# Patient Record
Sex: Female | Born: 1945
Health system: Southern US, Community
[De-identification: ages and names within clinical notes are randomized; demographics above are authoritative.]

## PROBLEM LIST (undated history)

## (undated) DIAGNOSIS — I10 Essential (primary) hypertension: Secondary | ICD-10-CM

## (undated) DIAGNOSIS — M1990 Unspecified osteoarthritis, unspecified site: Secondary | ICD-10-CM

## (undated) DIAGNOSIS — E119 Type 2 diabetes mellitus without complications: Secondary | ICD-10-CM

## (undated) HISTORY — PX: TONSILLECTOMY: SUR1361

## (undated) HISTORY — PX: GASTRIC BYPASS: SHX52

## (undated) HISTORY — DX: Essential (primary) hypertension: I10

## (undated) HISTORY — DX: Type 2 diabetes mellitus without complications: E11.9

## (undated) HISTORY — PX: COLON RESECTION: SHX5231

## (undated) HISTORY — DX: Unspecified osteoarthritis, unspecified site: M19.90

---

## 2011-10-02 ENCOUNTER — Emergency Department (INDEPENDENT_AMBULATORY_CARE_PROVIDER_SITE_OTHER): Payer: 59

## 2011-10-02 ENCOUNTER — Emergency Department (HOSPITAL_BASED_OUTPATIENT_CLINIC_OR_DEPARTMENT_OTHER)
Admission: EM | Admit: 2011-10-02 | Discharge: 2011-10-03 | Disposition: A | Discharge status: Home / Self Care | Payer: 59 | Attending: Emergency Medicine | Admitting: Emergency Medicine

## 2011-10-02 DIAGNOSIS — Y92009 Unspecified place in unspecified non-institutional (private) residence as the place of occurrence of the external cause: Secondary | ICD-10-CM | POA: Insufficient documentation

## 2011-10-02 DIAGNOSIS — W2203XA Walked into furniture, initial encounter: Secondary | ICD-10-CM | POA: Insufficient documentation

## 2011-10-02 DIAGNOSIS — S81809A Unspecified open wound, unspecified lower leg, initial encounter: Secondary | ICD-10-CM

## 2011-10-02 DIAGNOSIS — S81009A Unspecified open wound, unspecified knee, initial encounter: Secondary | ICD-10-CM | POA: Insufficient documentation

## 2011-10-02 DIAGNOSIS — S81811A Laceration without foreign body, right lower leg, initial encounter: Secondary | ICD-10-CM

## 2011-10-02 DIAGNOSIS — M79609 Pain in unspecified limb: Secondary | ICD-10-CM

## 2011-10-02 IMAGING — CR DG TIBIA/FIBULA 2V*R*
4 series · 4 of 4 positions shown · non-contrast
Comparison: None.

CLINICAL DATA: Ran into the corner of a metal bedframe, with
laceration at the distal aspect of the right lower leg.

RIGHT TIBIA AND FIBULA - 2 VIEW

[t tib/fib ap right (1 of 2)]
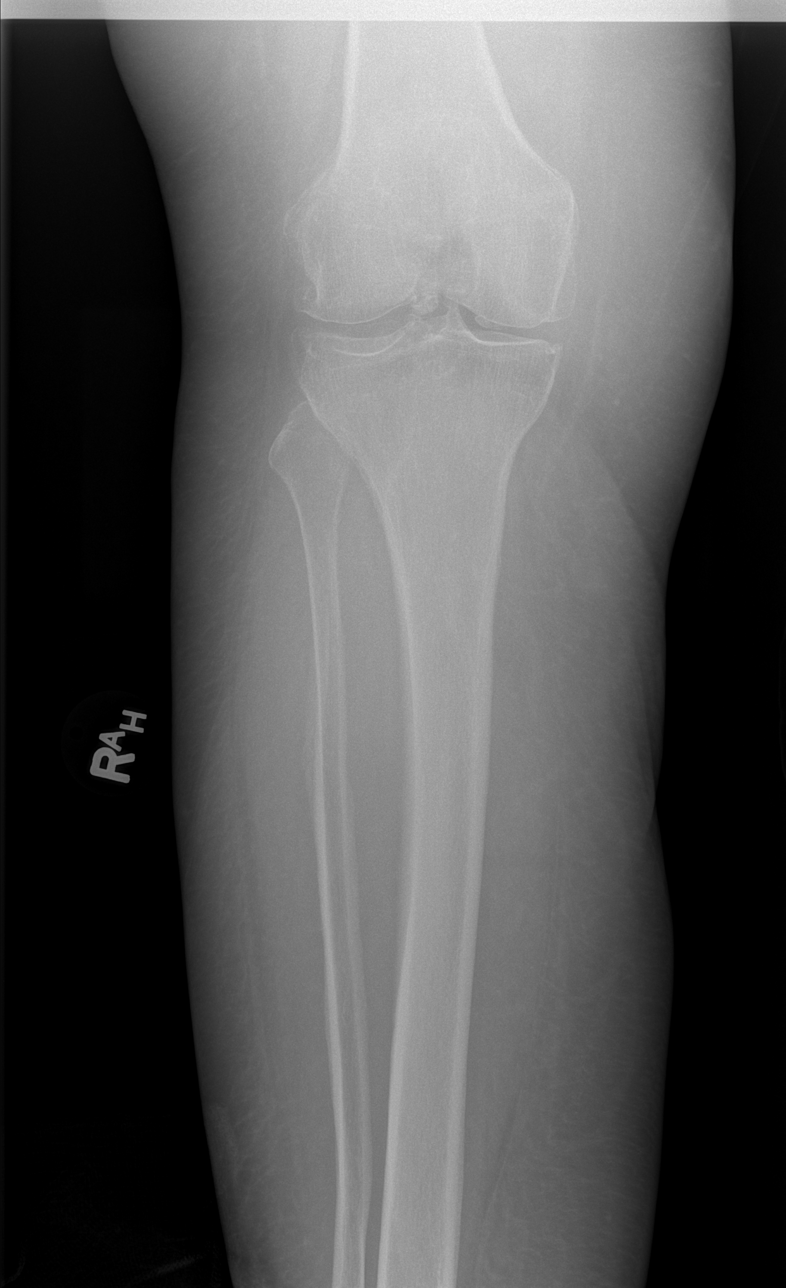

[t tib/fib ap right (2 of 2)]
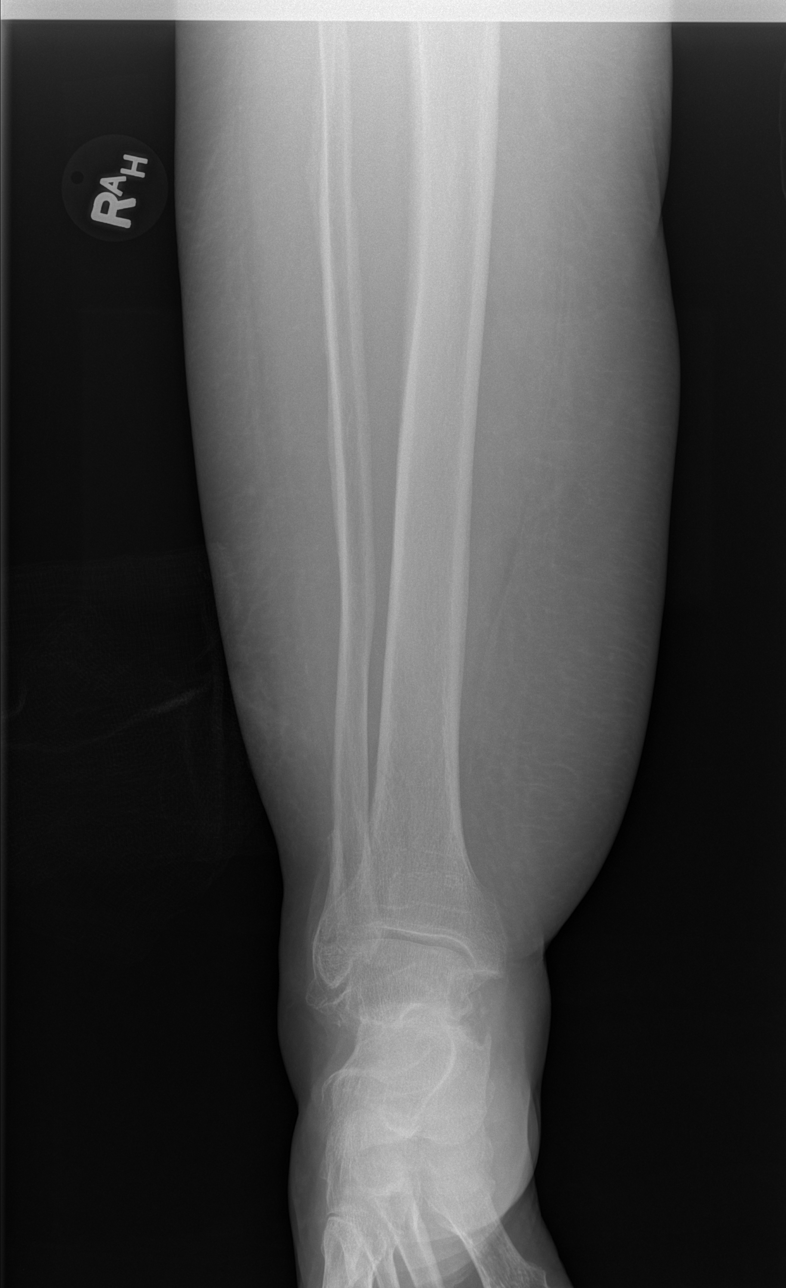

[t tib/fib lat right (1 of 2)]
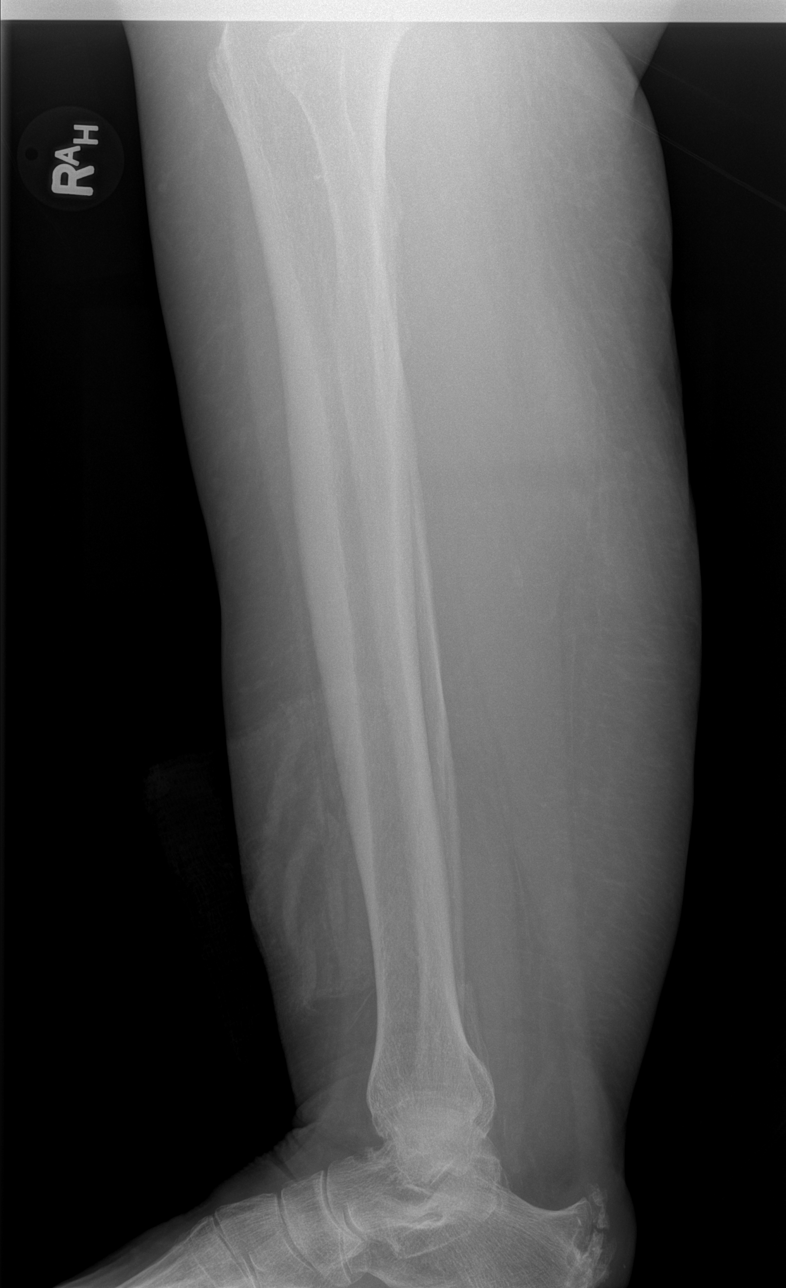

[t tib/fib lat right (2 of 2)]
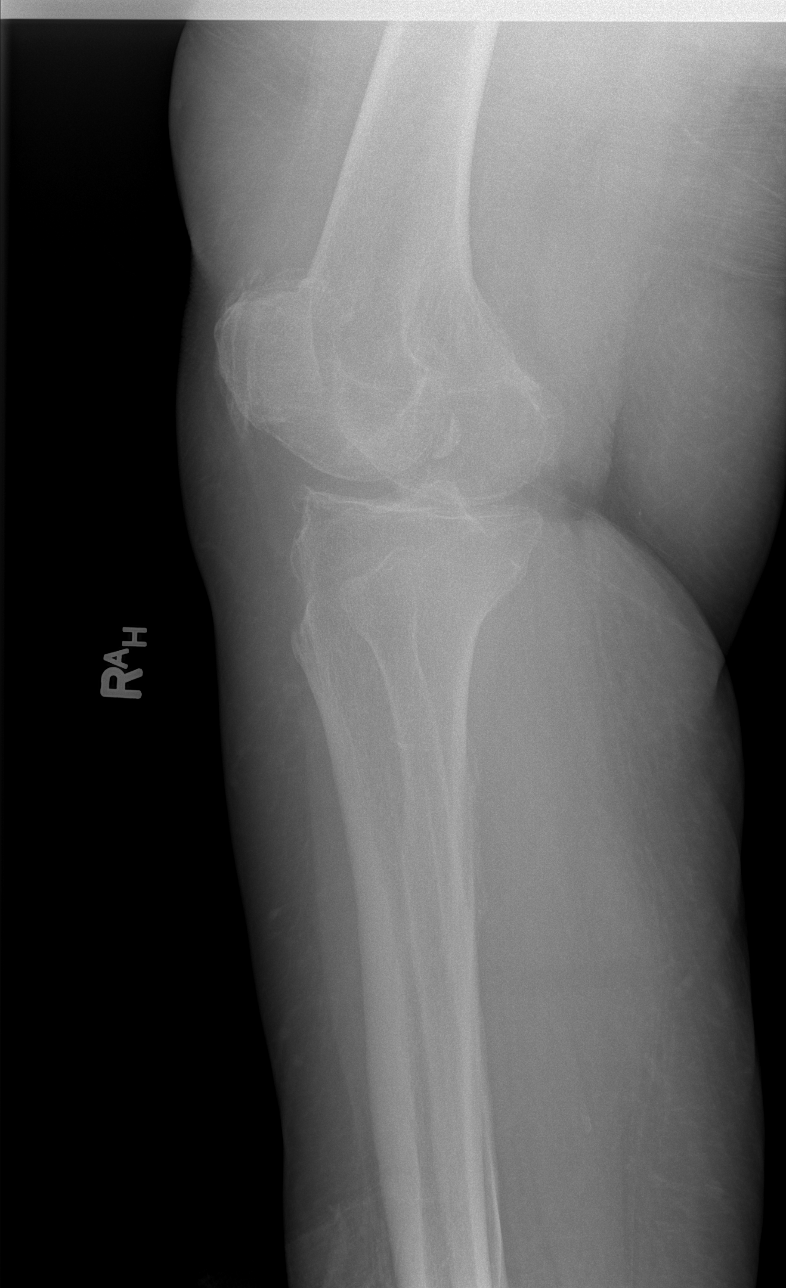

[4 of 4 positions shown; findings below may reference images not displayed]

FINDINGS: There is no evidence of fracture or dislocation.  Known
soft tissue disruption is difficult to fully characterize on
radiograph, though a bandage is noted overlying the lower leg.

The tibia and fibula appear intact.  Mild degenerative change is
noted at the right knee, with apparent small loose bodies and
osteophytes at the tibial spine.  A prominent posterior calcaneal
spur is incidentally noted.

Apparent periosteal reaction along the posterior tibia and fibula
may reflect remote injury.  No definite expansile lesion is seen.
IMPRESSION: 1.  No evidence of fracture or dislocation.
2.  Mild degenerative change at the right knee.

## 2011-10-02 MED ORDER — TETANUS-DIPHTH-ACELL PERTUSSIS 5-2-15.5 LF-MCG/0.5 IM SUSP: 0.5000 mL | Freq: Once | INTRAMUSCULAR | Status: DC

## 2011-10-02 MED ORDER — OXYCODONE-ACETAMINOPHEN 5-325 MG PO TABS
1.0000 | ORAL_TABLET | Freq: Once | ORAL | Status: AC
  Administered 2011-10-02: 1 via ORAL
  Filled 2011-10-02: qty 1

## 2011-10-02 MED ORDER — TETANUS-DIPHTH-ACELL PERTUSSIS 5-2.5-18.5 LF-MCG/0.5 IM SUSP
0.5000 mL | Freq: Once | INTRAMUSCULAR | Status: AC
  Administered 2011-10-02: 0.5 mL via INTRAMUSCULAR
  Filled 2011-10-02: qty 0.5

## 2011-10-02 MED ORDER — LIDOCAINE-EPINEPHRINE 2 %-1:100000 IJ SOLN
30.0000 mL | Freq: Once | INTRAMUSCULAR | Status: AC
  Administered 2011-10-02: 1 mL
  Filled 2011-10-02: qty 1

## 2011-10-02 NOTE — ED Notes (Signed)
Pt states that she lacerated her right lower anterior leg on her grandson's bed frame.  Pt presents with approx 3in laceration to leg, actively bleeding at triage.  Unaware of last tetanus.

## 2011-10-03 NOTE — ED Provider Notes (Signed)
History     CSN: [id number]  Arrival date & time 10/02/11  2134   First MD Initiated Contact with Patient 10/02/11 2201      Chief Complaint  Patient presents with  . Extremity Laceration     The history is provided by the patient.   the patient reports striking her right lower leg on the edge of the bed and just prior to arrival resulting in laceration a small amount of blood loss.  The pain is worsened by movement and palpation.  She denies numbness tingling or weakness of her right lower extremity.  She is unclear when her last tetanus shot was.  Her symptoms are mild.  Nothing worsens her symptoms.  Nothing improves her symptoms.  Her symptoms are constant.  History reviewed. No pertinent past medical history.  Past Surgical History  Procedure Date  . Tonsillectomy   . Gastric bypass   . Colon resection     History reviewed. No pertinent family history.  History  Substance Use Topics  . Smoking status: Never Smoker   . Smokeless tobacco: Never Used  . Alcohol Use: No    OB History    Grav Para Term Preterm Abortions TAB SAB Ect Mult Living                  Review of Systems  All other systems reviewed and are negative.    Allergies  Codeine  Home Medications   Current Outpatient Rx  Name Route Sig Dispense Refill  . VITAMIN D 2000 UNITS PO CAPS Oral Take 4,000 Units by mouth daily.      Marland Kitchen LEVOTHYROXINE SODIUM 88 MCG PO TABS Oral Take 88 mcg by mouth daily.      Marland Kitchen LISINOPRIL 20 MG PO TABS Oral Take 20 mg by mouth daily.      . ADULT MULTIVITAMIN W/MINERALS CH Oral Take 1 tablet by mouth daily.      Marland Kitchen VITAMIN D (ERGOCALCIFEROL) 50000 UNITS PO CAPS Oral Take 50,000 Units by mouth every 7 (seven) days. Take on Tuesday        BP 120/68  Pulse 67  Temp(Src) 98.1 F (36.7 C) (Oral)  Resp 20  Ht 5\' 3"  (1.6 m)  Wt 184 lb (83.462 kg)  BMI 32.59 kg/m2  SpO2 100%  Physical Exam  Constitutional: She is oriented to person, place, and time. She appears  well-developed and well-nourished.  HENT:  Head: Normocephalic.  Eyes: EOM are normal.  Neck: Normal range of motion.  Pulmonary/Chest: Effort normal.  Musculoskeletal: Normal range of motion.       L-shaped laceration to her distal right lower leg on the lateral aspect.  Injuries down to the subcutaneous tissue.  There are no foreign bodies noted.  The bottom of the wound was found.  There is no active bleeding at this time.  She has normal right DP and PT pulse.  She has normal motor function in her right foot.  There are no signs of contamination or infection at this time  Neurological: She is alert and oriented to person, place, and time.  Psychiatric: She has a normal mood and affect.    ED Course  Procedures (including critical care time)  LACERATION REPAIR Performed by: Lyanne Co Consent: Verbal consent obtained. Risks and benefits: risks, benefits and alternatives were discussed Patient identity confirmed: provided demographic data Time out performed prior to procedure Prepped and Draped in normal sterile fashion Wound explored and a who Laceration Location: right  distal lower leg on lateral aspect Laceration Length: 6cm No Foreign Bodies seen or palpated Anesthesia: local infiltration Local anesthetic: lidocaine 2% with epinephrine Anesthetic total: 10 ml Irrigation method: syringe Amount of cleaning: standard LAYERED CLOSURE Skin closure: 4-0 is a Vicryl(deep), 3-0 Prolene (skin) Number of sutures or staples: 3 deep, 8 superficial Technique: simple interrupted Patient tolerance: Patient tolerated the procedure well with no immediate complications.   Labs Reviewed - No data to display Dg Tibia/fibula Right  10/02/2011  *RADIOLOGY REPORT*  Clinical Data: Ran into the corner of a metal bedframe, with laceration at the distal aspect of the right lower leg.  RIGHT TIBIA AND FIBULA - 2 VIEW  Comparison: None.  Findings: There is no evidence of fracture or  dislocation.  Known soft tissue disruption is difficult to fully characterize on radiograph, though a bandage is noted overlying the lower leg.  The tibia and fibula appear intact.  Mild degenerative change is noted at the right knee, with apparent small loose bodies and osteophytes at the tibial spine.  A prominent posterior calcaneal spur is incidentally noted.  Apparent periosteal reaction along the posterior tibia and fibula may reflect remote injury.  No definite expansile lesion is seen.  IMPRESSION:  1.  No evidence of fracture or dislocation. 2.  Mild degenerative change at the right knee.  Original Report Authenticated By: Tonia Ghent, M.D.   I personally reviewed the x-ray  1. Laceration of right lower extremity       MDM  Laceration repaired.  Images negative.  Infection warnings given.  No other injury        Lyanne Co, MD 10/03/11 [phone]

## 2012-12-03 ENCOUNTER — Emergency Department (HOSPITAL_BASED_OUTPATIENT_CLINIC_OR_DEPARTMENT_OTHER)
Admission: EM | Admit: 2012-12-03 | Discharge: 2012-12-03 | Disposition: A | Discharge status: Home / Self Care | Payer: 59 | Attending: Emergency Medicine | Admitting: Emergency Medicine

## 2012-12-03 ENCOUNTER — Emergency Department (HOSPITAL_BASED_OUTPATIENT_CLINIC_OR_DEPARTMENT_OTHER): Payer: 59

## 2012-12-03 ENCOUNTER — Encounter (HOSPITAL_BASED_OUTPATIENT_CLINIC_OR_DEPARTMENT_OTHER): Payer: Self-pay | Admitting: *Deleted

## 2012-12-03 DIAGNOSIS — R110 Nausea: Secondary | ICD-10-CM | POA: Insufficient documentation

## 2012-12-03 DIAGNOSIS — Z9884 Bariatric surgery status: Secondary | ICD-10-CM | POA: Insufficient documentation

## 2012-12-03 DIAGNOSIS — W1809XA Striking against other object with subsequent fall, initial encounter: Secondary | ICD-10-CM | POA: Insufficient documentation

## 2012-12-03 DIAGNOSIS — Y929 Unspecified place or not applicable: Secondary | ICD-10-CM | POA: Insufficient documentation

## 2012-12-03 DIAGNOSIS — Z79899 Other long term (current) drug therapy: Secondary | ICD-10-CM | POA: Insufficient documentation

## 2012-12-03 DIAGNOSIS — T148XXA Other injury of unspecified body region, initial encounter: Secondary | ICD-10-CM

## 2012-12-03 DIAGNOSIS — Y939 Activity, unspecified: Secondary | ICD-10-CM | POA: Insufficient documentation

## 2012-12-03 DIAGNOSIS — S8000XA Contusion of unspecified knee, initial encounter: Secondary | ICD-10-CM | POA: Insufficient documentation

## 2012-12-03 MED ORDER — IBUPROFEN 200 MG PO TABS
600.0000 mg | ORAL_TABLET | Freq: Once | ORAL | Status: AC
  Administered 2012-12-03: 600 mg via ORAL
  Filled 2012-12-03: qty 1

## 2012-12-03 MED ORDER — HYDROCODONE-ACETAMINOPHEN 5-325 MG PO TABS
2.0000 | ORAL_TABLET | Freq: Once | ORAL | Status: AC
  Administered 2012-12-03: 2 via ORAL
  Filled 2012-12-03: qty 2

## 2012-12-03 MED ORDER — IBUPROFEN 600 MG PO TABS: 600.0000 mg | ORAL_TABLET | Freq: Four times a day (QID) | ORAL | Status: DC | PRN

## 2012-12-03 MED ORDER — HYDROCODONE-ACETAMINOPHEN 5-325 MG PO TABS: 1.0000 | ORAL_TABLET | Freq: Four times a day (QID) | ORAL | Status: DC | PRN

## 2012-12-03 NOTE — ED Notes (Signed)
Pt states she fell in the bathtub about 1-1/2 weeks ago. Has been using rest/ice/elevation without relief.

## 2012-12-03 NOTE — Discharge Instructions (Signed)
Contusion A contusion is a deep bruise. Contusions are the result of an injury that caused bleeding under the skin. The contusion may turn blue, purple, or yellow. Minor injuries will give you a painless contusion, but more severe contusions may stay painful and swollen for a few weeks.  CAUSES  A contusion is usually caused by a blow, trauma, or direct force to an area of the body. SYMPTOMS   Swelling and redness of the injured area.  Bruising of the injured area.  Tenderness and soreness of the injured area.  Pain. DIAGNOSIS  The diagnosis can be made by taking a history and physical exam. An X-ray, CT scan, or MRI may be needed to determine if there were any associated injuries, such as fractures. TREATMENT  Specific treatment will depend on what area of the body was injured. In general, the best treatment for a contusion is resting, icing, elevating, and applying cold compresses to the injured area. Over-the-counter medicines may also be recommended for pain control. Ask your caregiver what the best treatment is for your contusion. HOME CARE INSTRUCTIONS   Put ice on the injured area.  Put ice in a plastic bag.  Place a towel between your skin and the bag.  Leave the ice on for 15 to 20 minutes, 3 to 4 times a day.  Only take over-the-counter or prescription medicines for pain, discomfort, or fever as directed by your caregiver. Your caregiver may recommend avoiding anti-inflammatory medicines (aspirin, ibuprofen, and naproxen) for 48 hours because these medicines may increase bruising.  Rest the injured area.  If possible, elevate the injured area to reduce swelling. SEEK IMMEDIATE MEDICAL CARE IF:   You have increased bruising or swelling.  You have pain that is getting worse.  Your swelling or pain is not relieved with medicines. MAKE SURE YOU:   Understand these instructions.  Will watch your condition.  Will get help right away if you are not doing well or get  worse. Document Released: 07/08/2005 Document Revised: 12/21/2011 Document Reviewed: 08/03/2011 [facility] Patient Information 2013 [place], [place].  RICE: Routine Care for Injuries The routine care of many injuries includes Rest, Ice, Compression, and Elevation (RICE). HOME CARE INSTRUCTIONS  Rest is needed to allow your body to heal. Routine activities can usually be resumed when comfortable. Injured tendons and bones can take up to 6 weeks to heal. Tendons are the cord-like structures that attach muscle to bone.  Ice following an injury helps keep the swelling down and reduces pain.  Put ice in a plastic bag.  Place a towel between your skin and the bag.  Leave the ice on for 15 to 20 minutes, 3 to 4 times a day. Do this while awake, for the first 24 to 48 hours. After that, continue as directed by your caregiver.  Compression helps keep swelling down. It also gives support and helps with discomfort. If an elastic bandage has been applied, it should be removed and reapplied every 3 to 4 hours. It should not be applied tightly, but firmly enough to keep swelling down. Watch fingers or toes for swelling, bluish discoloration, coldness, numbness, or excessive pain. If any of these problems occur, remove the bandage and reapply loosely. Contact your caregiver if these problems continue.  Elevation helps reduce swelling and decreases pain. With extremities, such as the arms, hands, legs, and feet, the injured area should be placed near or above the level of the heart, if possible. SEEK IMMEDIATE MEDICAL CARE IF:  You  have persistent pain and swelling.  You develop redness, numbness, or unexpected weakness.  Your symptoms are getting worse rather than improving after several days. These symptoms may indicate that further evaluation or further X-rays are needed. Sometimes, X-rays may not show a small broken bone (fracture) until 1 week or 10 days later. Make a follow-up appointment with your  caregiver. Ask when your X-ray results will be ready. Make sure you get your X-ray results. Document Released: 01/10/2001 Document Revised: 12/21/2011 Document Reviewed: 02/27/2011 [facility] Patient Information 2013 [place], [place].

## 2012-12-03 NOTE — ED Provider Notes (Signed)
History    This chart was scribed for Derwood Kaplan, MD by Leone Payor, ED Scribe. This patient was seen in room MH02/MH02 and the patient's care was started 3:33 PM.   CSN: [id number]  Arrival date & time 12/03/12  1410   First MD Initiated Contact with Patient 12/03/12 1522      Chief Complaint  Patient presents with  . Knee Injury     The history is provided by the patient. No language interpreter was used.    Nancy Hinton is a 67 y.o. female who presents to the Emergency Department complaining of ongoing, constant, unchanged right knee pain after falling in the bathtub about 1.5 weeks ago. She states she fell onto her right knee and her calf was folded underneath her thigh. States she feels nauseated from the pain when she walks on it. States the affected area goes through intermittent periods of swelling. She is taking aleve with mild periods of relief. Pt takes BP medication and levothyroxine daily. She denies numbness or tingling.   Pt has h/o gastric bypass.  Pt denies smoking and alcohol use.  History reviewed. No pertinent past medical history.  Past Surgical History  Procedure Laterality Date  . Tonsillectomy    . Gastric bypass    . Colon resection      History reviewed. No pertinent family history.  History  Substance Use Topics  . Smoking status: Never Smoker   . Smokeless tobacco: Never Used  . Alcohol Use: No    No OB history provided.   Review of Systems  Constitutional: Negative.   HENT: Negative.   Eyes: Negative.   Respiratory: Negative.   Cardiovascular: Negative.   Gastrointestinal: Negative.   Musculoskeletal: Positive for joint swelling and arthralgias.  Neurological: Negative.  Negative for numbness.  Psychiatric/Behavioral: Negative.   All other systems reviewed and are negative.    Allergies  Codeine  Home Medications   Current Outpatient Rx  Name  Route  Sig  Dispense  Refill  . Cholecalciferol (VITAMIN D) 2000 UNITS CAPS   Oral   Take 4,000 Units by mouth daily.           Marland Kitchen levothyroxine (SYNTHROID, LEVOTHROID) 88 MCG tablet   Oral   Take 88 mcg by mouth daily.           Marland Kitchen lisinopril (PRINIVIL,ZESTRIL) 20 MG tablet   Oral   Take 20 mg by mouth daily.           . Multiple Vitamin (MULITIVITAMIN WITH MINERALS) TABS   Oral   Take 1 tablet by mouth daily.           . Vitamin D, Ergocalciferol, (DRISDOL) 50000 UNITS CAPS   Oral   Take 50,000 Units by mouth every 7 (seven) days. Take on Tuesday             BP 109/51  Pulse 66  Temp(Src) 98.1 F (36.7 C) (Oral)  Resp 16  Ht 5\' 4"  (1.626 m)  Wt 185 lb (83.915 kg)  BMI 31.74 kg/m2  SpO2 100%  Physical Exam  Nursing note and vitals reviewed. Constitutional: She is oriented to person, place, and time. She appears well-developed and well-nourished. No distress.  HENT:  Head: Normocephalic and atraumatic.  Eyes: EOM are normal.  Neck: Neck supple. No tracheal deviation present.  Cardiovascular: Normal rate, regular rhythm and normal heart sounds.  Exam reveals no gallop and no friction rub.   No murmur heard. Pulmonary/Chest: Effort normal and  breath sounds normal. No respiratory distress. She has no wheezes. She has no rales. She exhibits no tenderness.  Abdominal: Soft.  Musculoskeletal: Normal range of motion.  Right leg is non tender except knee area. Tender on post part of knee. Diffuse tenderness all around knee. Worse on the medial and lateral aspect. Able to flex knee however ROM is compromised. Significant edema and effusion with ecchymosis of the knee and proximal tibia. On proximal tibia and mid level of tibia there is tenderness to palpation. 1+ DP bilaterally. Sensation is normal.   Neurological: She is alert and oriented to person, place, and time.  Skin: Skin is warm and dry.  Psychiatric: She has a normal mood and affect. Her behavior is normal.    ED Course  Procedures (including critical care time)  DIAGNOSTIC  STUDIES: Oxygen Saturation is 100% on room air, normal by my interpretation.    COORDINATION OF CARE: 3:32 PM Discussed treatment plan which includes pain medication, knee immobilizer and follow up with orthopedist with pt at bedside and pt agreed to plan.    Labs Reviewed - No data to display Dg Knee Complete 4 Views Right  12/03/2012  *RADIOLOGY REPORT*  Clinical Data: Trauma 1 week ago.  Worsening pain.  RIGHT KNEE - COMPLETE 4+ VIEW  Comparison: 10/02/2011 tibia / fibula films.  Findings:  Mild medial and moderate lateral compartment osteoarthritis.  Mild osteopenia.  Severe patellofemoral osteoarthritis.  Limited evaluation for joint effusion, secondary to patient body habitus.  Apparent prepatellar soft tissue swelling may be due to patient body habitus.  Enthesopathic change at quadriceps insertion and patellar tendon origin.  No definite acute fracture identified.  IMPRESSION: Advanced three compartment osteoarthritis.  No definite acute osseous abnormality or joint effusion.  Degraded evaluation, secondary patient body habitus and underlying osteopenia.   Original Report Authenticated By: Jeronimo Greaves, M.D.      No diagnosis found.    MDM  I personally performed the services described in this documentation, which was scribed in my presence. The recorded information has been reviewed and is accurate.  Pt comes in with knee pain. Fall more than a week ago, ambulating with pain. Knee effusion noted, with diffuse ecchymoses. No clinical concerns for comparment syndrome and no laxity appreciated on anterior and posterior drawers.  Knee immobilizer, RICE, follow up.   Derwood Kaplan, MD 12/03/12 [phone]

## 2013-03-16 ENCOUNTER — Ambulatory Visit (INDEPENDENT_AMBULATORY_CARE_PROVIDER_SITE_OTHER): Payer: 59 | Admitting: Family Medicine

## 2013-03-16 VITALS — BP 132/70 | HR 65 | Temp 98.1°F | Resp 16 | Ht 62.5 in | Wt 198.6 lb

## 2013-03-16 DIAGNOSIS — K1370 Unspecified lesions of oral mucosa: Secondary | ICD-10-CM

## 2013-03-16 DIAGNOSIS — K130 Diseases of lips: Secondary | ICD-10-CM

## 2013-03-16 DIAGNOSIS — J029 Acute pharyngitis, unspecified: Secondary | ICD-10-CM

## 2013-03-16 DIAGNOSIS — I10 Essential (primary) hypertension: Secondary | ICD-10-CM

## 2013-03-16 DIAGNOSIS — R220 Localized swelling, mass and lump, head: Secondary | ICD-10-CM

## 2013-03-16 LAB — POCT RAPID STREP A (OFFICE): Rapid Strep A Screen: NEGATIVE

## 2013-03-16 MED ORDER — MAGIC MOUTHWASH W/LIDOCAINE: 5.0000 mL | Freq: Four times a day (QID) | ORAL | Status: DC | PRN

## 2013-03-16 MED ORDER — AMLODIPINE BESYLATE 5 MG PO TABS: 5.0000 mg | ORAL_TABLET | Freq: Every day | ORAL | Status: DC

## 2013-03-16 NOTE — Progress Notes (Signed)
[facility]:  Office Visit  Chief Complaint:  Chief Complaint  Patient presents with  . Sore Throat  . Mouth Lesions  . lips swollen    HPI: Nancy Hinton is a 67 y.o. female who complains of  sore throat and swelling of lips and tightness in throat x 3 days, this started suddenly. No new foods, no new meds. She has itching around her mouth. Also mouth lesions and painful to drink/eat. On Lisinopril for HTN no prior probelms. No h/o cold sore or canker sores or herpes. She does not have CP/SOB. She states that all her lbawork and vitamins except D are normal, she sees her surgeon for gastric bypass regular.   Past Medical History  Diagnosis Date  . Arthritis   . Hypertension    Past Surgical History  Procedure Laterality Date  . Tonsillectomy    . Gastric bypass    . Colon resection     History   Social History  . Marital Status: Divorced    Spouse Name: N/A    Number of Children: N/A  . Years of Education: N/A   Social History Main Topics  . Smoking status: Never Smoker   . Smokeless tobacco: Never Used  . Alcohol Use: No  . Drug Use: No  . Sexually Active: No   Other Topics Concern  . None   Social History Narrative  . None   Family History  Problem Relation Age of Onset  . Alzheimer's disease Mother   . Heart disease Father   . Heart attack Maternal Grandmother    Allergies  Allergen Reactions  . Codeine Nausea Only   Prior to Admission medications   Medication Sig Start Date End Date Taking? Authorizing Provider  levothyroxine (SYNTHROID, LEVOTHROID) 88 MCG tablet Take 88 mcg by mouth daily.     Yes Historical Provider, MD  lisinopril (PRINIVIL,ZESTRIL) 20 MG tablet Take 20 mg by mouth daily.     Yes Historical Provider, MD  Vitamin D, Ergocalciferol, (DRISDOL) 50000 UNITS CAPS Take 50,000 Units by mouth every 7 (seven) days. Take on Tuesday     Yes Historical Provider, MD  Cholecalciferol (VITAMIN D) 2000 UNITS CAPS Take 4,000  Units by mouth daily.      Historical Provider, MD  HYDROcodone-acetaminophen (NORCO/VICODIN) 5-325 MG per tablet Take 1 tablet by mouth every 6 (six) hours as needed for pain. 12/03/12   Derwood Kaplan, MD  ibuprofen (ADVIL,MOTRIN) 600 MG tablet Take 1 tablet (600 mg total) by mouth every 6 (six) hours as needed for pain. 12/03/12   Derwood Kaplan, MD  Multiple Vitamin (MULITIVITAMIN WITH MINERALS) TABS Take 1 tablet by mouth daily.      Historical Provider, MD     ROS: The patient denies fevers, chills, night sweats, unintentional weight loss, chest pain, palpitations, wheezing, dyspnea on exertion, nausea, vomiting, abdominal pain, dysuria, hematuria, melena, numbness, weakness, or tingling.   All other systems have been reviewed and were otherwise negative with the exception of those mentioned in the HPI and as above.    PHYSICAL EXAM: Filed Vitals:   03/16/13 2011  BP: 132/70  Pulse: 65  Temp: 98.1 F (36.7 C)  Resp: 16   Filed Vitals:   03/16/13 2011  Height: 5' 2.5" (1.588 m)  Weight: 198 lb 9.6 oz (90.084 kg)   Body mass index is 35.72 kg/(m^2).  General: Alert, no acute distress HEENT:  Normocephalic, atraumatic, oropharynx patent. No exudates, no tonsils, + erythema along mucosa  but does not quite appear to be herpetic ulcers Cardiovascular:  Regular rate and rhythm, no rubs murmurs or gallops.  No Carotid bruits, radial pulse intact. No pedal edema.  Respiratory: Clear to auscultation bilaterally.  No wheezes, rales, or rhonchi.  No cyanosis, no use of accessory musculature GI: No organomegaly, abdomen is soft and non-tender, positive bowel sounds.  No masses. Skin: Facial eythema around mouth but no ulcers Neurologic: Facial musculature symmetric. Psychiatric: Patient is appropriate throughout our interaction. Lymphatic: No cervical lymphadenopathy Musculoskeletal: Gait intact.   LABS: Results for orders placed in visit on 03/16/13  POCT RAPID STREP A (OFFICE)       Result Value Range   Rapid Strep A Screen Negative  Negative     EKG/XRAY:   Primary read interpreted by Dr. Conley Rolls at [facility].   ASSESSMENT/PLAN: Encounter Diagnoses  Name Primary?  . Sore throat Yes  . Lip swelling   . Mouth lesion   . HTN (hypertension)    Suspect this may be a reaction to lisinopril She is currently not having SOB/CP/voice changes, pulse ox 99% Throat on exam was patent Will dc lisinopril Place patient on Norvasc 5 mg daily, advise to monitor BP Gave her Magic wouthwash with viscous lidocaine F/u by phone with her in AM.    Nancy Loeffler PHUONG, DO 03/16/2013 8:53 PM    Called patient on 03/17/2013 at 9:55-not improved. So wil rx Vatrex for possible HSV lesions Gross sideeffects, risk and benefits, and alternatives of medications d/w patient. Patient is aware that all medications have potential sideeffects and we are unable to predict every sideeffect or drug-drug interaction that may occur.

## 2013-03-17 MED ORDER — VALACYCLOVIR HCL 1 G PO TABS: ORAL_TABLET | ORAL | Status: DC

## 2013-03-19 LAB — CULTURE, GROUP A STREP: Organism ID, Bacteria: NORMAL

## 2013-03-21 ENCOUNTER — Encounter: Payer: Self-pay | Admitting: *Deleted

## 2013-03-21 ENCOUNTER — Telehealth: Payer: Self-pay | Admitting: Radiology

## 2013-03-21 DIAGNOSIS — K1370 Unspecified lesions of oral mucosa: Secondary | ICD-10-CM

## 2013-03-21 DIAGNOSIS — R220 Localized swelling, mass and lump, head: Secondary | ICD-10-CM

## 2013-03-21 DIAGNOSIS — J029 Acute pharyngitis, unspecified: Secondary | ICD-10-CM

## 2013-03-21 MED ORDER — MAGIC MOUTHWASH W/LIDOCAINE: 5.0000 mL | Freq: Four times a day (QID) | ORAL | Status: DC | PRN

## 2013-03-21 NOTE — Telephone Encounter (Signed)
I spoke to patient she is slightly better, not getting worse. Renewal on her mouthwash was sent she is in [place]. Amy

## 2013-04-07 ENCOUNTER — Ambulatory Visit (INDEPENDENT_AMBULATORY_CARE_PROVIDER_SITE_OTHER): Payer: 59 | Admitting: Family Medicine

## 2013-04-07 VITALS — BP 158/88 | HR 79 | Temp 98.1°F | Resp 16 | Ht 62.0 in | Wt 198.0 lb

## 2013-04-07 DIAGNOSIS — K1370 Unspecified lesions of oral mucosa: Secondary | ICD-10-CM

## 2013-04-07 DIAGNOSIS — R5383 Other fatigue: Secondary | ICD-10-CM

## 2013-04-07 DIAGNOSIS — K121 Other forms of stomatitis: Secondary | ICD-10-CM

## 2013-04-07 DIAGNOSIS — E559 Vitamin D deficiency, unspecified: Secondary | ICD-10-CM

## 2013-04-07 DIAGNOSIS — R5381 Other malaise: Secondary | ICD-10-CM

## 2013-04-07 DIAGNOSIS — E079 Disorder of thyroid, unspecified: Secondary | ICD-10-CM

## 2013-04-07 DIAGNOSIS — F32A Depression, unspecified: Secondary | ICD-10-CM

## 2013-04-07 DIAGNOSIS — F329 Major depressive disorder, single episode, unspecified: Secondary | ICD-10-CM

## 2013-04-07 DIAGNOSIS — F3289 Other specified depressive episodes: Secondary | ICD-10-CM

## 2013-04-07 LAB — POCT CBC
Granulocyte percent: 61.9 %G (ref 37–80)
HCT, POC: 43.7 % (ref 37.7–47.9)
Hemoglobin: 13.6 g/dL (ref 12.2–16.2)
Lymph, poc: 2.2 (ref 0.6–3.4)
MCH, POC: 28.9 pg (ref 27–31.2)
MCHC: 31.1 g/dL — AB (ref 31.8–35.4)
MCV: 93 fL (ref 80–97)
MID (cbc): 0.4 (ref 0–0.9)
MPV: 8.5 fL (ref 0–99.8)
POC Granulocyte: 4.3 (ref 2–6.9)
POC LYMPH PERCENT: 32.4 %L (ref 10–50)
POC MID %: 5.7 %M (ref 0–12)
Platelet Count, POC: 324 10*3/uL (ref 142–424)
RBC: 4.7 M/uL (ref 4.04–5.48)
RDW, POC: 13.4 %
WBC: 6.9 10*3/uL (ref 4.6–10.2)

## 2013-04-07 MED ORDER — SERTRALINE HCL 25 MG PO TABS: 25.0000 mg | ORAL_TABLET | Freq: Every day | ORAL | Status: DC

## 2013-04-07 MED ORDER — VALACYCLOVIR HCL 500 MG PO TABS: 500.0000 mg | ORAL_TABLET | Freq: Two times a day (BID) | ORAL | Status: DC

## 2013-04-07 NOTE — Progress Notes (Signed)
[facility]:  Office Visit  Chief Complaint:  Chief Complaint  Patient presents with  . Oral Pain    oral blisters- still present    HPI: Nancy Hinton is a 67 y.o. female who complains of here for recheck of her mouth ulsers, HTN:  Oral blister rechecked, valtrex helped but not sure, she till is unable tot aste anything and feels as if she has drainage and bitterness in her mouth. She states the valtrex made the blisters calm down but did not completely go away. Initially when she came and saw me she said it felt like her throat was closing and tongue was swollen and she was on an ACEI and so we took her off of that and put her on norvasc. She still has that same sensation. She sttes her BP though has been about the same, 130-140s/80s at home.    She is depressed and stressed, 67 y/o ado[pted daughter with cocaine addition, 81 y/o father with dementia. Her daughter and her husband and child and the patient's father are all living with her. Last week her daughter was recently in hospital for psychotic event. The daughter has attempted suicide on numerous occasion, the patient dreads getting any phone calls after dark because she is afraid this may be the last phone call. She gets phone calls from them at least 10-20 times a day and she wrosk full time. She does not have a supportive network in terms of her family..   No fevers or chills. N/v/abd pain,diarrhea, rash or thought of SI/HI  Past Medical History  Diagnosis Date  . Arthritis   . Hypertension    Past Surgical History  Procedure Laterality Date  . Tonsillectomy    . Gastric bypass    . Colon resection     History   Social History  . Marital Status: Divorced    Spouse Name: N/A    Number of Children: N/A  . Years of Education: N/A   Social History Main Topics  . Smoking status: Never Smoker   . Smokeless tobacco: Never Used  . Alcohol Use: No  . Drug Use: No  . Sexually Active: No   Other  Topics Concern  . None   Social History Narrative  . None   Family History  Problem Relation Age of Onset  . Alzheimer's disease Mother   . Heart disease Father   . Heart attack Maternal Grandmother    Allergies  Allergen Reactions  . Codeine Nausea Only   Prior to Admission medications   Medication Sig Start Date End Date Taking? Authorizing Provider  Alum & Mag Hydroxide-Simeth (MAGIC MOUTHWASH W/LIDOCAINE) SOLN Take 5 mLs by mouth 4 (four) times daily as needed. 03/21/13  Yes Cookie Pore P Marlowe Cinquemani, DO  amLODipine (NORVASC) 5 MG tablet Take 1 tablet (5 mg total) by mouth daily. 03/16/13  Yes Nicklous Aburto P Dock Baccam, DO  Cholecalciferol (VITAMIN D) 2000 UNITS CAPS Take 4,000 Units by mouth daily.     Yes Historical Provider, MD  levothyroxine (SYNTHROID, LEVOTHROID) 88 MCG tablet Take 88 mcg by mouth daily.     Yes Historical Provider, MD  lisinopril (PRINIVIL,ZESTRIL) 20 MG tablet Take 20 mg by mouth daily.     Yes Historical Provider, MD  Multiple Vitamin (MULITIVITAMIN WITH MINERALS) TABS Take 1 tablet by mouth daily.     Yes Historical Provider, MD  Vitamin D, Ergocalciferol, (DRISDOL) 50000 UNITS CAPS Take 50,000 Units by mouth every 7 (seven) days. Take on  Tuesday     Yes Historical Provider, MD  ibuprofen (ADVIL,MOTRIN) 600 MG tablet Take 1 tablet (600 mg total) by mouth every 6 (six) hours as needed for pain. 12/03/12   Derwood Kaplan, MD  sertraline (ZOLOFT) 25 MG tablet Take 1 tablet (25 mg total) by mouth daily. 04/07/13   Kamaya Keckler P Gresham Caetano, DO  valACYclovir (VALTREX) 500 MG tablet Take 1 tablet (500 mg total) by mouth 2 (two) times daily. 04/07/13   Eavan Gonterman P Celestina Gironda, DO     ROS: The patient denies fevers, chills, night sweats, unintentional weight loss, chest pain, palpitations, wheezing, dyspnea on exertion, nausea, vomiting, abdominal pain, dysuria, hematuria, melena, numbness, weakness, or tingling.   All other systems have been reviewed and were otherwise negative with the exception of those mentioned in the HPI  and as above.    PHYSICAL EXAM: Filed Vitals:   04/07/13 1728  BP: 158/88  Pulse: 79  Temp: 98.1 F (36.7 C)  Resp: 16   Filed Vitals:   04/07/13 1728  Height: 5\' 2"  (1.575 m)  Weight: 198 lb (89.812 kg)   Body mass index is 36.21 kg/(m^2).  General: Alert, no acute distress, tired appearing HEENT:  Normocephalic, atraumatic, oropharynx patent. + mouth ulcers on the inside of her cheeks bilaterally, wellcircumcrobed, she laso had a denuding of her tong papillae. NO exudates, no erythema. Dentition is good Cardiovascular:  Regular rate and rhythm, no rubs murmurs or gallops.  No Carotid bruits, radial pulse intact. No pedal edema.  Respiratory: Clear to auscultation bilaterally.  No wheezes, rales, or rhonchi.  No cyanosis, no use of accessory musculature GI: No organomegaly, abdomen is soft and non-tender, positive bowel sounds.  No masses. Skin: No rashes. Neurologic: Facial musculature symmetric. Psychiatric: Patient is appropriate throughout our interaction. Lymphatic: No cervical lymphadenopathy Musculoskeletal: Gait intact.   LABS: Results for orders placed in visit on 04/07/13  POCT CBC      Result Value Range   WBC 6.9  4.6 - 10.2 K/uL   Lymph, poc 2.2  0.6 - 3.4   POC LYMPH PERCENT 32.4  10 - 50 %L   MID (cbc) 0.4  0 - 0.9   POC MID % 5.7  0 - 12 %M   POC Granulocyte 4.3  2 - 6.9   Granulocyte percent 61.9  37 - 80 %G   RBC 4.70  4.04 - 5.48 M/uL   Hemoglobin 13.6  12.2 - 16.2 g/dL   HCT, POC 16.1  09.6 - 47.9 %   MCV 93.0  80 - 97 fL   MCH, POC 28.9  27 - 31.2 pg   MCHC 31.1 (*) 31.8 - 35.4 g/dL   RDW, POC 04.5     Platelet Count, POC 324  142 - 424 K/uL   MPV 8.5  0 - 99.8 fL     EKG/XRAY:   Primary read interpreted by Dr. Con[facility]s at [facility].   ASSESSMENT/PLAN: Encounter Diagnoses  Name Primary?  . Thyroid disease Yes  . Fatigue   . Mouth ulcers   . Unspecified vitamin D deficiency   . Depression   . Mouth lesion     Rx Valtrex 2 gram BID x 2  doses, then 500 mg BID x 1 week and then supression therapy with 500 mg daily. I think her depression/anxiety and stress maybe causing these oral ulcers to flare up. She has lost of stressors at home OTC lysine daily Rx Zoloft 25 mg daily for depression, will titrate prn  Labs pending F/u in 4 weeks. She will call me in 1 week to see how things are going with her mouth ulcers Defer ENT referral until trial of valtrex has been completed Gross sideeffects, risk and benefits, and alternatives of medications d/w patient. Patient is aware that all medications have potential sideeffects and we are unable to predict every sideeffect or drug-drug interaction that may occur.    Rockne Coons, DO 04/07/2013 6:39 PM

## 2013-04-08 LAB — COMPREHENSIVE METABOLIC PANEL WITH GFR
BUN: 17 mg/dL (ref 6–23)
CO2: 25 meq/L (ref 19–32)
Calcium: 8.8 mg/dL (ref 8.4–10.5)
Chloride: 104 meq/L (ref 96–112)
Creat: 0.76 mg/dL (ref 0.50–1.10)
Glucose, Bld: 116 mg/dL — ABNORMAL HIGH (ref 70–99)
Total Bilirubin: 0.6 mg/dL (ref 0.3–1.2)

## 2013-04-08 LAB — TSH: TSH: 1.982 u[IU]/mL (ref 0.350–4.500)

## 2013-04-08 LAB — COMPREHENSIVE METABOLIC PANEL
ALT: 15 U/L (ref 0–35)
AST: 20 U/L (ref 0–37)
Albumin: 4 g/dL (ref 3.5–5.2)
Alkaline Phosphatase: 81 U/L (ref 39–117)
Potassium: 4.8 mEq/L (ref 3.5–5.3)
Sodium: 139 mEq/L (ref 135–145)
Total Protein: 6.5 g/dL (ref 6.0–8.3)

## 2013-04-08 LAB — MAGNESIUM: Magnesium: 1.9 mg/dL (ref 1.5–2.5)

## 2013-04-10 LAB — VITAMIN D 25 HYDROXY (VIT D DEFICIENCY, FRACTURES): Vit D, 25-Hydroxy: 40 ng/mL (ref 30–89)

## 2013-04-11 LAB — B12 AND FOLATE PANEL
Folate: 14.5 ng/mL (ref >3.0)
Vitamin B-12: 334 pg/mL (ref 211–946)

## 2013-04-12 ENCOUNTER — Telehealth: Payer: Self-pay

## 2013-04-12 NOTE — Telephone Encounter (Signed)
Labs normal called patient to advise, to you FYI

## 2013-04-12 NOTE — Telephone Encounter (Signed)
Patient is calling to get lab results. [phone]

## 2013-04-12 NOTE — Telephone Encounter (Signed)
Patient indicates she has not improved much, will give this more time if not improved within a couple weeks she will call for referral.

## 2013-04-15 ENCOUNTER — Telehealth: Payer: Self-pay | Admitting: Family Medicine

## 2013-04-15 DIAGNOSIS — B009 Herpesviral infection, unspecified: Secondary | ICD-10-CM

## 2013-04-15 MED ORDER — VALACYCLOVIR HCL 500 MG PO TABS: 500.0000 mg | ORAL_TABLET | Freq: Every day | ORAL | Status: DC

## 2013-04-15 NOTE — Telephone Encounter (Signed)
Spoke with patient will do HSV 1 ppx prevention Valtrex 500 mg daily until I see her next, hopefully with ulcers healing she cn regian her tastebuds. Will see her in 4 weeks or so.

## 2013-04-19 ENCOUNTER — Telehealth: Payer: Self-pay

## 2013-04-19 NOTE — Telephone Encounter (Signed)
   Akane, Tessier - 04/19/2013 12:48 PM ','<More Detail >>       Lenell Antu, DO       Sent: Wed April 19, 2013 2:25 PM    To: [facility] Clinical Message Pool        ----- Message from Lenell Antu, DO sent at 04/19/2013 2:25 PM -----     She is already on norvasc For HTN and we had stopped her lisinopril until we resolved her throat and mouth issues. How is her BP? I will call her once you get her BP readings, later today when I get off shift.     She is not checking her blood pressures. She states she does not think the lisinopril was causing her reaction. She states either medication is fine, either the Amlodipine or the Lisinopril.

## 2013-04-19 NOTE — Telephone Encounter (Signed)
Lisinopril historical, she has never gotten from [place]? Can you take this over w/o office visit?

## 2013-04-19 NOTE — Telephone Encounter (Signed)
Pt is calling to see about getting a prescription for Liprinzile said that Dr Conley Rolls was wanting to get her get back on that particular prescription after what Dr Conley Rolls had given her at her last visit Call back number is [phone] Pharmacy is [facility] in [place] [phone]

## 2013-04-21 NOTE — Telephone Encounter (Signed)
Pt states that she really needs a refill on lisinopril because she is almost out.   [phone]

## 2013-04-22 NOTE — Telephone Encounter (Signed)
Patient calling again regarding her blood pressure meds. She is out of Norvasc and doesn't believe that the Lisinopril was causing a reaction because she had taken it before. Patient is concerned that she is not going to have any blood pressure meds. Please advise.  Best #  [phone]  Her last bp reading was 140/87.

## 2013-04-24 MED ORDER — AMLODIPINE BESYLATE 5 MG PO TABS: 5.0000 mg | ORAL_TABLET | Freq: Every day | ORAL | Status: DC

## 2013-04-24 NOTE — Telephone Encounter (Signed)
Dr Conley Rolls does not want her to resume the Lisinopril. Have sent in Norvasc, Dr Conley Rolls wants her to continue with the norvasc. Pt advised

## 2013-05-13 ENCOUNTER — Ambulatory Visit (INDEPENDENT_AMBULATORY_CARE_PROVIDER_SITE_OTHER): Payer: 59 | Admitting: Family Medicine

## 2013-05-13 VITALS — BP 140/90 | HR 62 | Temp 98.1°F | Resp 16 | Ht 62.0 in | Wt 200.0 lb

## 2013-05-13 DIAGNOSIS — K121 Other forms of stomatitis: Secondary | ICD-10-CM

## 2013-05-13 DIAGNOSIS — B009 Herpesviral infection, unspecified: Secondary | ICD-10-CM

## 2013-05-13 DIAGNOSIS — I10 Essential (primary) hypertension: Secondary | ICD-10-CM

## 2013-05-13 MED ORDER — CHLORTHALIDONE 25 MG PO TABS: 25.0000 mg | ORAL_TABLET | Freq: Every day | ORAL | Status: DC

## 2013-05-13 MED ORDER — VALACYCLOVIR HCL 500 MG PO TABS: 500.0000 mg | ORAL_TABLET | Freq: Every day | ORAL | Status: DC

## 2013-05-13 NOTE — Progress Notes (Signed)
 [facility]:  Office Visit  Chief Complaint:  Chief Complaint  Patient presents with  . Mouth sions    still have not healed  . Hypertension    recheck    HPI: Nancy Hinton is a 67 y.o. female who complains of  Here for recheck of: 1. HTN-was on lisinopril then dc because had outh swelling to norvasc, she has been on it for over 1 month and has now had recent leg swelling , no other SEs 2. Mouth sions-she has 60% improvement with Valtrex 500 mg daily after treatment dose, still cannot taste and still has ulcers 3. Depression-worse home life, daughter is a narcotic and cocaine abuser, lives with her; she is struggling but gets theapry and does feel low dose Zoloft helps  Please look at prior OV from 04/07/13  below:  Oral blister rechecked, valtrex helped but not sure, she till is unable tot aste anything and feels as if she has drainage and bitterness in her mouth. She states the valtrex made the blisters calm down but did not completely go away. Initially when she came and saw me she said it felt like her throat was closing and tongue was swollen and she was on an ACEI and so we took her off of that and put her on norvasc. She still has that same sensation. She sttes her BP though has been about the same, 130-140s/80s at home.  She is depressed and stressed, 67 y/o ado[pted daughter with cocaine addition, 43 y/o father with dementia. Her daughter and her husband and child and the patient's father are all living with her. Last week her daughter was recently in hospital for psychotic event. The daughter has attempted suicide on numerous occasion, the patient dreads getting any phone calls after dark because she is afraid this may be the last phone call. She gets phone calls from them at least 10-20 times a day and she wrosk full time. She does not have a supportive network in terms of her family..    Past Medical History  Diagnosis Date  . Arthritis   . Hypertension     Past Surgical History  Procedure Laterality Date  . Tonsillectomy    . Gastric bypass    . Colon resection     History   Social History  . Marital Status: Divorced    Spouse Name: N/A    Number of Children: N/A  . Years of Education: N/A   Social History Main Topics  . Smoking status: Never Smoker   . Smokeless tobacco: Never Used  . Alcohol Use: No  . Drug Use: No  . Sexually Active: No   Other Topics Concern  . None   Social History Narrative  . None   Family History  Problem Relation Age of Onset  . Alzheimer's disease Mother   . Heart disease Father   . Heart attack Maternal Grandmother    Allergies  Allergen Reactions  . Codeine Nausea Only   Prior to Admission medications   Medication Sig Start Date End Date Taking? Authorizing Provider  amLODipine (NORVASC) 5 MG tablet Take 1 tablet (5 mg total) by mouth daily. 04/24/13  Yes  P , DO  Cholecalciferol (VITAMIN D) 2000 UNITS CAPS Take 4,000 Units by mouth daily.     Yes Historical Provider, MD  ibuprofen (ADVIL,MOTRIN) 600 MG tablet Take 1 tablet (600 mg total) by mouth every 6 (six) hours as needed for pain. 12/03/12  Yes Ankit Nanavati,  MD  levothyroxine (SYNTHROID, LEVOTHROID) 88 MCG tablet Take 88 mcg by mouth daily.     Yes Historical Provider, MD  Multiple Vitamin (MULITIVITAMIN WITH MINERALS) TABS Take 1 tablet by mouth daily.     Yes Historical Provider, MD  sertraline (ZOLOFT) 25 MG tablet Take 1 tablet (25 mg total) by mouth daily. 04/07/13  Yes  P , DO  Vitamin D, Ergocalciferol, (DRISDOL) 50000 UNITS CAPS Take 50,000 Units by mouth every 7 (seven) days. Take on Tuesday     Yes Historical Provider, MD  Alum & Mag Hydroxide-Simeth (MAGIC MOUTHWASH W/LIDOCAINE) SOLN Take 5 mLs by mouth 4 (four) times daily as needed. 03/21/13    P , DO  amLODipine (NORVASC) 5 MG tablet Take 1 tablet (5 mg total) by mouth daily. 03/16/13    P , DO  valACYclovir (VALTREX) 500 MG tablet Take 1 tablet  (500 mg total) by mouth daily. 04/15/13    P , DO     ROS: The patient denies fevers, chills, night sweats, unintentional weight loss, chest pain, palpitations, wheezing, dyspnea on exertion, nausea, vomiting, abdominal pain, dysuria, hematuria, melena, numbness, weakness, or tingling.   All other systems have been reviewed and were otherwise negative with the exception of those mentioned in the HPI and as above.    PHYSICAL EXAM: Filed Vitals:   05/13/13 0810  BP: 140/90  Pulse: 62  Temp: 98.1 F (36.7 C)  Resp: 16   Filed Vitals:   05/13/13 0810  Height: 5\' 2"  (1.575 m)  Weight: 200 lb (90.719 kg)   Body mass index is 36.57 kg/(m^2).  General: Alert, no acute distress HEENT:  Normocephalic, atraumatic, oropharynx patent. + ulcers in mouth, no e/o cancerous lesions Cardiovascular:  Regular rate and rhythm, no rubs murmurs or gallops.  No Carotid bruits, radial pulse intact. No pedal edema.  Respiratory: Clear to auscultation bilaterally.  No wheezes, rales, or rhonchi.  No cyanosis, no use of accessory musculature GI: No organomegaly, abdomen is soft and non-tender, positive bowel sounds.  No masses. Skin: No rashes. Neurologic: Facial musculature symmetric. Psychiatric: Patient is appropriate throughout our interaction. Lymphatic: No cervical lymphadenopathy Musculoskeletal: Gait intact.   LABS: Results for orders placed in visit on 04/07/13  COMPREHENSIVE METABOLIC PANEL      Result Value Range   Sodium 139  135 - 145 mEq/L   Potassium 4.8  3.5 - 5.3 mEq/L   Chloride 104  96 - 112 mEq/L   CO2 25  19 - 32 mEq/L   Glucose, Bld 116 (*) 70 - 99 mg/dL   BUN 17  6 - 23 mg/dL   Creat 7.82  9.56 - 2.13 mg/dL   Total Bilirubin 0.6  0.3 - 1.2 mg/dL   Alkaline Phosphatase 81  39 - 117 U/L   AST 20  0 - 37 U/L   ALT 15  0 - 35 U/L   Total Protein 6.5  6.0 - 8.3 g/dL   Albumin 4.0  3.5 - 5.2 g/dL   Calcium 8.8  8.4 - 08.6 mg/dL  TSH      Result Value Range   TSH  1.982  0.350 - 4.500 uIU/mL  VITAMIN D 25 HYDROXY      Result Value Range   Vit D, 25-Hydroxy 40  30 - 89 ng/mL  B12 AND FOLATE PANEL      Result Value Range   Vitamin B-12 334  211 - 946 pg/mL   Folate 14.5  >3.0 ng/mL  MAGNESIUM      Result Value Range   Magnesium 1.9  1.5 - 2.5 mg/dL  POCT CBC      Result Value Range   WBC 6.9  4.6 - 10.2 K/uL   Lymph, poc 2.2  0.6 - 3.4   POC LYMPH PERCENT 32.4  10 - 50 %L   MID (cbc) 0.4  0 - 0.9   POC MID % 5.7  0 - 12 %M   POC Granulocyte 4.3  2 - 6.9   Granulocyte percent 61.9  37 - 80 %G   RBC 4.70  4.04 - 5.48 M/uL   Hemoglobin 13.6  12.2 - 16.2 g/dL   HCT, POC 21.3  08.6 - 47.9 %   MCV 93.0  80 - 97 fL   MCH, POC 28.9  27 - 31.2 pg   MCHC 31.1 (*) 31.8 - 35.4 g/dL   RDW, POC 57.8     Platelet Count, POC 324  142 - 424 K/uL   MPV 8.5  0 - 99.8 fL     EKG/XRAY:   Primary read interpreted by Dr. Conley Rolls at [facility].   ASSESSMENT/PLAN: Encounter Diagnoses  Name Primary?  . HTN (hypertension) Yes  . Mouth ulcers   . HSV-1 (herpes simplex virus 1) infection    1. Trial prilosec 20 mg BID 2. Rx Chlorathalidone for BP control and also edema. She will continue with Norvasc for now, if swelling continues to be an issue then we will DC the Norvasc. I have decided to not contnue with ACEI  Since she states she has lip swelling when she first came to our office for this issue.  3. Refilled Vatrex F/u in 2 weeks   ,  PHUONG, DO 05/13/2013 8:48 AM

## 2013-05-25 ENCOUNTER — Ambulatory Visit (INDEPENDENT_AMBULATORY_CARE_PROVIDER_SITE_OTHER): Payer: 59 | Admitting: Family Medicine

## 2013-05-25 VITALS — BP 140/80 | HR 62 | Temp 98.0°F | Resp 17 | Ht 63.0 in | Wt 195.0 lb

## 2013-05-25 DIAGNOSIS — R439 Unspecified disturbances of smell and taste: Secondary | ICD-10-CM

## 2013-05-25 DIAGNOSIS — E039 Hypothyroidism, unspecified: Secondary | ICD-10-CM

## 2013-05-25 DIAGNOSIS — K121 Other forms of stomatitis: Secondary | ICD-10-CM

## 2013-05-25 DIAGNOSIS — I10 Essential (primary) hypertension: Secondary | ICD-10-CM

## 2013-05-25 DIAGNOSIS — F329 Major depressive disorder, single episode, unspecified: Secondary | ICD-10-CM

## 2013-05-25 DIAGNOSIS — K1370 Unspecified lesions of oral mucosa: Secondary | ICD-10-CM

## 2013-05-25 DIAGNOSIS — F32A Depression, unspecified: Secondary | ICD-10-CM

## 2013-05-25 DIAGNOSIS — F3289 Other specified depressive episodes: Secondary | ICD-10-CM

## 2013-05-25 DIAGNOSIS — J029 Acute pharyngitis, unspecified: Secondary | ICD-10-CM

## 2013-05-25 DIAGNOSIS — R432 Parageusia: Secondary | ICD-10-CM

## 2013-05-25 MED ORDER — LISINOPRIL 20 MG PO TABS: 20.0000 mg | ORAL_TABLET | Freq: Every day | ORAL | Status: DC

## 2013-05-25 MED ORDER — SERTRALINE HCL 50 MG PO TABS: 50.0000 mg | ORAL_TABLET | Freq: Every day | ORAL | Status: DC

## 2013-05-25 MED ORDER — LEVOTHYROXINE SODIUM 88 MCG PO TABS: 88.0000 ug | ORAL_TABLET | Freq: Every day | ORAL | Status: DC

## 2013-05-25 NOTE — Patient Instructions (Signed)
Hypertension As your heart beats, it forces blood through your arteries. This force is your blood pressure. If the pressure is too high, it is called hypertension (HTN) or high blood pressure. HTN is dangerous because you may have it and not know it. High blood pressure may mean that your heart has to work harder to pump blood. Your arteries may be narrow or stiff. The extra work puts you at risk for heart disease, stroke, and other problems.  Blood pressure consists of two numbers, a higher number over a lower, 110/72, for example. It is stated as "110 over 72." The ideal is below 120 for the top number (systolic) and under 80 for the bottom (diastolic). Write down your blood pressure today. You should pay close attention to your blood pressure if you have certain conditions such as:  Heart failure.  Prior heart attack.  Diabetes  Chronic kidney disease.  Prior stroke.  Multiple risk factors for heart disease. To see if you have HTN, your blood pressure should be measured while you are seated with your arm held at the level of the heart. It should be measured at least twice. A one-time elevated blood pressure reading (especially in the Emergency Department) does not mean that you need treatment. There may be conditions in which the blood pressure is different between your right and left arms. It is important to see your caregiver soon for a recheck. Most people have essential hypertension which means that there is not a specific cause. This type of high blood pressure may be lowered by changing lifestyle factors such as:  Stress.  Smoking.  Lack of exercise.  Excessive weight.  Drug/tobacco/alcohol use.  Eating less salt. Most people do not have symptoms from high blood pressure until it has caused damage to the body. Effective treatment can often prevent, delay or reduce that damage. TREATMENT  When a cause has been identified, treatment for high blood pressure is directed at the  cause. There are a large number of medications to treat HTN. These fall into several categories, and your caregiver will help you select the medicines that are best for you. Medications may have side effects. You should review side effects with your caregiver. If your blood pressure stays high after you have made lifestyle changes or started on medicines,   Your medication(s) may need to be changed.  Other problems may need to be addressed.  Be certain you understand your prescriptions, and know how and when to take your medicine.  Be sure to follow up with your caregiver within the time frame advised (usually within two weeks) to have your blood pressure rechecked and to review your medications.  If you are taking more than one medicine to lower your blood pressure, make sure you know how and at what times they should be taken. Taking two medicines at the same time can result in blood pressure that is too low. SEEK IMMEDIATE MEDICAL CARE IF:  You develop a severe headache, blurred or changing vision, or confusion.  You have unusual weakness or numbness, or a faint feeling.  You have severe chest or abdominal pain, vomiting, or breathing problems. MAKE SURE YOU:   Understand these instructions.  Will watch your condition.  Will get help right away if you are not doing well or get worse. Document Released: 09/28/2005 Document Revised: 12/21/2011 Document Reviewed: 05/18/2008 ExitCare Patient Information 2014 [employer].  Diet for Gastroesophageal Reflux Disease, Adult Reflux (acid reflux) is when acid from your stomach   flows up into the esophagus. When acid comes in contact with the esophagus, the acid causes irritation and soreness (inflammation) in the esophagus. When reflux happens often or so severely that it causes damage to the esophagus, it is called gastroesophageal reflux disease (GERD). Nutrition therapy can help ease the discomfort of GERD. FOODS OR DRINKS TO AVOID OR  LIMIT  Smoking or chewing tobacco. Nicotine is one of the most potent stimulants to acid production in the gastrointestinal tract.  Caffeinated and decaffeinated coffee and black tea.  Regular or low-calorie carbonated beverages or energy drinks (caffeine-free carbonated beverages are allowed).   Strong spices, such as black pepper, white pepper, red pepper, cayenne, curry powder, and chili powder.  Peppermint or spearmint.  Chocolate.  High-fat foods, including meats and fried foods. Extra added fats including oils, butter, salad dressings, and nuts. Limit these to less than 8 tsp per day.  Fruits and vegetables if they are not tolerated, such as citrus fruits or tomatoes.  Alcohol.  Any food that seems to aggravate your condition. If you have questions regarding your diet, call your caregiver or a registered dietitian. OTHER THINGS THAT MAY HELP GERD INCLUDE:   Eating your meals slowly, in a relaxed setting.  Eating 5 to 6 small meals per day instead of 3 large meals.  Eliminating food for a period of time if it causes distress.  Not lying down until 3 hours after eating a meal.  Keeping the head of your bed raised 6 to 9 inches (15 to 23 cm) by using a foam wedge or blocks under the legs of the bed. Lying flat may make symptoms worse.  Being physically active. Weight loss may be helpful in reducing reflux in overweight or obese adults.  Wear loose fitting clothing EXAMPLE MEAL PLAN This meal plan is approximately 2,000 calories based on ChooseMy[employer]ate.gov meal planning guidelines. Breakfast   cup cooked oatmeal.  1 cup strawberries.  1 cup low-fat milk.  1 oz almonds. Snack  1 cup cucumber slices.  6 oz yogurt (made from low-fat or fat-free milk). Lunch  2 slice whole-wheat bread.  2 oz sliced turkey.  2 tsp mayonnaise.  1 cup blueberries.  1 cup snap peas. Snack  6 whole-wheat crackers.  1 oz string cheese. Dinner   cup brown rice.  1  cup mixed veggies.  1 tsp olive oil.  3 oz grilled fish. Document Released: 09/28/2005 Document Revised: 12/21/2011 Document Reviewed: 08/14/2011 ExitCare Patient Information 2014 [employer].  

## 2013-05-25 NOTE — Progress Notes (Signed)
[facility]:  Office Visit  Chief Complaint:  Chief Complaint  Patient presents with  . Follow-up    HPI: Nancy Hinton is a 67 y.o. female who complains of :  Blood pressure is ok, still has swelling in her [place] on norvasc and even after adding 25 mg of Chlorthalidone, It is less but still significant enough where she notices it.  She is improving with her ulcers on the valtrex, much better, still has no taste. We had discussed a trial of PPI and also antihistamine use on last visit but she did not understand so has not taken them.  She thinks she may need an increase in her zoloft. She is on 25 mg for depression related to her current stressor with elderly father and substance abusing daughter and her family aho are all living with her while she is trying to [place] a full time job.    Past Medical History  Diagnosis Date  . Arthritis   . Hypertension    Past Surgical History  Procedure Laterality Date  . Tonsillectomy    . Gastric bypass    . Colon resection     History   Social History  . Marital Status: Divorced    Spouse Name: N/A    Number of Children: N/A  . Years of Education: N/A   Social History Main Topics  . Smoking status: Never Smoker   . Smokeless tobacco: Never Used  . Alcohol Use: No  . Drug Use: No  . Sexual Activity: No   Other Topics Concern  . None   Social History Narrative  . None   Family History  Problem Relation Age of Onset  . Alzheimer's disease Mother   . Heart disease Father   . Heart attack Maternal Grandmother    Allergies  Allergen Reactions  . Codeine Nausea Only   Prior to Admission medications   Medication Sig Start Date End Date Taking? Authorizing Provider  amLODipine (NORVASC) 5 MG tablet Take 1 tablet (5 mg total) by mouth daily. 04/24/13  Yes Lyal Husted P Nneoma Harral, DO  chlorthalidone (HYGROTON) 25 MG tablet Take 1 tablet (25 mg total) by mouth daily. 05/13/13  Yes Dajah Fischman P Arnesha Schiraldi, DO  Cholecalciferol (VITAMIN D)  2000 UNITS CAPS Take 4,000 Units by mouth daily.     Yes Historical Provider, MD  levothyroxine (SYNTHROID, LEVOTHROID) 88 MCG tablet Take 88 mcg by mouth daily.     Yes Historical Provider, MD  Multiple Vitamin (MULITIVITAMIN WITH MINERALS) TABS Take 1 tablet by mouth daily.     Yes Historical Provider, MD  sertraline (ZOLOFT) 25 MG tablet Take 1 tablet (25 mg total) by mouth daily. 04/07/13  Yes Emrie Gayle P Obed Samek, DO  valACYclovir (VALTREX) 500 MG tablet Take 1 tablet (500 mg total) by mouth daily. 05/13/13  Yes Marlyn Rabine P Allina Riches, DO  Vitamin D, Ergocalciferol, (DRISDOL) 50000 UNITS CAPS Take 50,000 Units by mouth every 7 (seven) days. Take on Tuesday     Yes Historical Provider, MD  Alum & Mag Hydroxide-Simeth (MAGIC MOUTHWASH W/LIDOCAINE) SOLN Take 5 mLs by mouth 4 (four) times daily as needed. 03/21/13   Ezmae Speers P Malakye Nolden, DO  ibuprofen (ADVIL,MOTRIN) 600 MG tablet Take 1 tablet (600 mg total) by mouth every 6 (six) hours as needed for pain. 12/03/12   Derwood Kaplan, MD     ROS: The patient denies fevers, chills, night sweats, unintentional weight loss, chest pain, palpitations, wheezing, dyspnea on exertion, nausea, vomiting, abdominal pain, dysuria,  hematuria, melena, numbness, weakness, or tingling.   All other systems have been reviewed and were otherwise negative with the exception of those mentioned in the HPI and as above.    PHYSICAL EXAM: Filed Vitals:   05/25/13 1440  BP: 140/80  Pulse: 62  Temp: 98 F (36.7 C)  Resp: 17   Filed Vitals:   05/25/13 1440  Height: 5\' 3"  (1.6 m)  Weight: 195 lb (88.451 kg)   Body mass index is 34.55 kg/(m^2).  General: Alert, no acute distress HEENT:  Normocephalic, atraumatic, oropharynx patent. + mouth ulcers, less than before, no obvious masses or e.o oral cancer at this time Cardiovascular:  Regular rate and rhythm, no rubs murmurs or gallops.  No Carotid bruits, radial pulse intact. + pedal edema.  Respiratory: Clear to auscultation bilaterally.  No wheezes,  rales, or rhonchi.  No cyanosis, no use of accessory musculature GI: No organomegaly, abdomen is soft and non-tender, positive bowel sounds.  No masses. Skin: No rashes. Neurologic: Facial musculature symmetric. Psychiatric: Patient is appropriate throughout our interaction. Lymphatic: No cervical lymphadenopathy Musculoskeletal: Gait intact.   LABS: Results for orders placed in visit on 04/07/13  COMPREHENSIVE METABOLIC PANEL      Result Value Range   Sodium 139  135 - 145 mEq/L   Potassium 4.8  3.5 - 5.3 mEq/L   Chloride 104  96 - 112 mEq/L   CO2 25  19 - 32 mEq/L   Glucose, Bld 116 (*) 70 - 99 mg/dL   BUN 17  6 - 23 mg/dL   Creat 4.78  2.95 - 6.21 mg/dL   Total Bilirubin 0.6  0.3 - 1.2 mg/dL   Alkaline Phosphatase 81  39 - 117 U/L   AST 20  0 - 37 U/L   ALT 15  0 - 35 U/L   Total Protein 6.5  6.0 - 8.3 g/dL   Albumin 4.0  3.5 - 5.2 g/dL   Calcium 8.8  8.4 - 30.8 mg/dL  TSH      Result Value Range   TSH 1.982  0.350 - 4.500 uIU/mL  VITAMIN D 25 HYDROXY      Result Value Range   Vit D, 25-Hydroxy 40  30 - 89 ng/mL  B12 AND FOLATE PANEL      Result Value Range   Vitamin B-12 334  211 - 946 pg/mL   Folate 14.5  >3.0 ng/mL  MAGNESIUM      Result Value Range   Magnesium 1.9  1.5 - 2.5 mg/dL  POCT CBC      Result Value Range   WBC 6.9  4.6 - 10.2 K/uL   Lymph, poc 2.2  0.6 - 3.4   POC LYMPH PERCENT 32.4  10 - 50 %L   MID (cbc) 0.4  0 - 0.9   POC MID % 5.7  0 - 12 %M   POC Granulocyte 4.3  2 - 6.9   Granulocyte percent 61.9  37 - 80 %G   RBC 4.70  4.04 - 5.48 M/uL   Hemoglobin 13.6  12.2 - 16.2 g/dL   HCT, POC 65.7  84.6 - 47.9 %   MCV 93.0  80 - 97 fL   MCH, POC 28.9  27 - 31.2 pg   MCHC 31.1 (*) 31.8 - 35.4 g/dL   RDW, POC 96.2     Platelet Count, POC 324  142 - 424 K/uL   MPV 8.5  0 - 99.8 fL  EKG/XRAY:   Primary read interpreted by Dr. Conley Rolls at [facility].   ASSESSMENT/PLAN: Encounter Diagnoses  Name Primary?  . Mouth ulcers Yes  . HTN (hypertension)   .  Altered taste   . Depression   . Unspecified hypothyroidism    Will return in 4 weeks Try trial of PPI and antihistamine and c.w Valtrex for mouth ulcers, loss of taste Will dc norvasc and chlorthalidone due to [place] edema, will restart on her Lisinopril 20 mg. IF she has any tongue swelling/mouth swelling then she needs to let me know Increased her Zoloft from 25 to 50 mg Refilled Levothyroxine F/u prn or in 4 weeks If no improvement then will refer to ENT    Jamone Garrido PHUONG, DO 05/27/2013 7:33 AM

## 2013-06-22 ENCOUNTER — Other Ambulatory Visit: Payer: Self-pay

## 2013-06-22 MED ORDER — AMLODIPINE BESYLATE 5 MG PO TABS: 5.0000 mg | ORAL_TABLET | Freq: Every day | ORAL | Status: DC

## 2013-10-02 ENCOUNTER — Ambulatory Visit (INDEPENDENT_AMBULATORY_CARE_PROVIDER_SITE_OTHER): Payer: Medicare HMO | Admitting: Family Medicine

## 2013-10-02 VITALS — BP 158/74 | HR 68 | Temp 98.4°F | Resp 18 | Ht 61.75 in | Wt 210.5 lb

## 2013-10-02 DIAGNOSIS — M129 Arthropathy, unspecified: Secondary | ICD-10-CM

## 2013-10-02 DIAGNOSIS — M1990 Unspecified osteoarthritis, unspecified site: Secondary | ICD-10-CM

## 2013-10-02 DIAGNOSIS — I10 Essential (primary) hypertension: Secondary | ICD-10-CM

## 2013-10-02 DIAGNOSIS — M25561 Pain in right knee: Secondary | ICD-10-CM

## 2013-10-02 DIAGNOSIS — M25569 Pain in unspecified knee: Secondary | ICD-10-CM

## 2013-10-02 DIAGNOSIS — E559 Vitamin D deficiency, unspecified: Secondary | ICD-10-CM

## 2013-10-02 DIAGNOSIS — F3289 Other specified depressive episodes: Secondary | ICD-10-CM

## 2013-10-02 DIAGNOSIS — Z8639 Personal history of other endocrine, nutritional and metabolic disease: Secondary | ICD-10-CM

## 2013-10-02 DIAGNOSIS — F329 Major depressive disorder, single episode, unspecified: Secondary | ICD-10-CM

## 2013-10-02 DIAGNOSIS — E039 Hypothyroidism, unspecified: Secondary | ICD-10-CM

## 2013-10-02 DIAGNOSIS — F32A Depression, unspecified: Secondary | ICD-10-CM

## 2013-10-02 LAB — COMPREHENSIVE METABOLIC PANEL WITH GFR
AST: 20 U/L (ref 0–37)
Albumin: 3.9 g/dL (ref 3.5–5.2)
Alkaline Phosphatase: 82 U/L (ref 39–117)
BUN: 15 mg/dL (ref 6–23)
Calcium: 8.1 mg/dL — ABNORMAL LOW (ref 8.4–10.5)
Chloride: 108 meq/L (ref 96–112)
Creat: 0.62 mg/dL (ref 0.50–1.10)
Glucose, Bld: 102 mg/dL — ABNORMAL HIGH (ref 70–99)

## 2013-10-02 LAB — COMPREHENSIVE METABOLIC PANEL
ALT: 18 U/L (ref 0–35)
CO2: 25 mEq/L (ref 19–32)
Potassium: 4 mEq/L (ref 3.5–5.3)
Sodium: 142 mEq/L (ref 135–145)
Total Bilirubin: 0.4 mg/dL (ref 0.3–1.2)
Total Protein: 6.2 g/dL (ref 6.0–8.3)

## 2013-10-02 LAB — POCT GLYCOSYLATED HEMOGLOBIN (HGB A1C): Hemoglobin A1C: 5.2

## 2013-10-02 LAB — TSH: TSH: 1.716 u[IU]/mL (ref 0.350–4.500)

## 2013-10-02 LAB — RHEUMATOID FACTOR: Rheumatoid fact SerPl-aCnc: <10 [IU]/mL (ref <=14)

## 2013-10-02 MED ORDER — HYDROCHLOROTHIAZIDE 12.5 MG PO CAPS: 12.5000 mg | ORAL_CAPSULE | Freq: Every day | ORAL | Status: DC

## 2013-10-02 MED ORDER — LEVOTHYROXINE SODIUM 88 MCG PO TABS: 88.0000 ug | ORAL_TABLET | Freq: Every day | ORAL | Status: DC

## 2013-10-02 MED ORDER — MELOXICAM 15 MG PO TABS: 15.0000 mg | ORAL_TABLET | Freq: Every day | ORAL | Status: DC

## 2013-10-02 MED ORDER — VITAMIN D (ERGOCALCIFEROL) 1.25 MG (50000 UNIT) PO CAPS: 50000.0000 [IU] | ORAL_CAPSULE | ORAL | Status: DC

## 2013-10-02 MED ORDER — TRAMADOL HCL 50 MG PO TABS: 50.0000 mg | ORAL_TABLET | Freq: Three times a day (TID) | ORAL | Status: DC | PRN

## 2013-10-02 MED ORDER — LISINOPRIL 20 MG PO TABS: 20.0000 mg | ORAL_TABLET | Freq: Every day | ORAL | Status: DC

## 2013-10-02 MED ORDER — SERTRALINE HCL 50 MG PO TABS: 50.0000 mg | ORAL_TABLET | Freq: Every day | ORAL | Status: DC

## 2013-10-02 NOTE — Progress Notes (Signed)
Chief Complaint:  Chief Complaint  Patient presents with  . Joint Pain    HPI: Nancy Hinton is a 67 y.o. female who is here for : 1. HTN-doing well, no SEs, she has some bialteral swellign in her legs which has been chronic. Gets worse at the end of the day if she ahs been sitting too long. Denies CP, dizziness 2. Joint pain bilaterally  all over, bilateral shoulders, arms, hands, knees, legs, ankles. She has had this for sometime. IS is a dull ache in the AM. Better when she starts exercising and moving. She ahs ahd weightgain recently.  3. She is doing well with her depression meds, on zoloft. Her daughter who is a cocaine addict is still doing about the same. She is going in and out of hospitals and rehab. It has been stressful but the zoloft helps her.  4. History of vit d def and also osteopenia 5. She has had diabetes prior gastric bypass, would like to get tested, she denies neuropathy 6. Hypothyroid, no complications, is compliant on meds.    Past Medical History  Diagnosis Date  . Arthritis   . Hypertension   . Diabetes mellitus without complication     history of DM prior to gastric bypass, she has not had to be on medicine since 170 lb weightloss.   Past Surgical History  Procedure Laterality Date  . Tonsillectomy    . Gastric bypass    . Colon resection     History   Social History  . Marital Status: Divorced    Spouse Name: N/A    Number of Children: N/A  . Years of Education: N/A   Social History Main Topics  . Smoking status: Never Smoker   . Smokeless tobacco: Never Used  . Alcohol Use: No  . Drug Use: No  . Sexual Activity: No   Other Topics Concern  . None   Social History Narrative  . None   Family History  Problem Relation Age of Onset  . Alzheimer's disease Mother   . Heart disease Father   . Heart attack Maternal Grandmother    Allergies  Allergen Reactions  . Codeine Nausea Only   Prior to Admission medications     Medication Sig Start Date End Date Taking? Authorizing Provider  levothyroxine (SYNTHROID, LEVOTHROID) 88 MCG tablet Take 1 tablet (88 mcg total) by mouth daily. 05/25/13  Yes Caleah Tortorelli P Lynnell Fiumara, DO  lisinopril (PRINIVIL,ZESTRIL) 20 MG tablet Take 1 tablet (20 mg total) by mouth daily. 05/25/13  Yes Lateria Alderman P Hoda Hon, DO  sertraline (ZOLOFT) 50 MG tablet Take 1 tablet (50 mg total) by mouth daily. 05/25/13  Yes Amin Fornwalt P Yousif Edelson, DO  Vitamin D, Ergocalciferol, (DRISDOL) 50000 UNITS CAPS Take 50,000 Units by mouth every 7 (seven) days. Take on Tuesday     Yes Historical Provider, MD  Alum & Mag Hydroxide-Simeth (MAGIC MOUTHWASH W/LIDOCAINE) SOLN Take 5 mLs by mouth 4 (four) times daily as needed. 03/21/13   Donnika Kucher P Zayan Delvecchio, DO  amLODipine (NORVASC) 5 MG tablet Take 1 tablet (5 mg total) by mouth daily. 06/22/13   Keandra Medero P Rector Devonshire, DO  Cholecalciferol (VITAMIN D) 2000 UNITS CAPS Take 4,000 Units by mouth daily.      Historical Provider, MD  ibuprofen (ADVIL,MOTRIN) 600 MG tablet Take 1 tablet (600 mg total) by mouth every 6 (six) hours as needed for pain. 12/03/12   Derwood Kaplan, MD  Multiple Vitamin (MULITIVITAMIN WITH MINERALS) TABS Take 1  tablet by mouth daily.      Historical Provider, MD  valACYclovir (VALTREX) 500 MG tablet Take 1 tablet (500 mg total) by mouth daily. 05/13/13   Roan Miklos P Revia Nghiem, DO     ROS: The patient denies fevers, chills, night sweats, unintentional weight loss, chest pain, palpitations, wheezing, dyspnea on exertion, nausea, vomiting, abdominal pain, dysuria, hematuria, melena, numbness, weakness, or tingling.   All other systems have been reviewed and were otherwise negative with the exception of those mentioned in the HPI and as above.    PHYSICAL EXAM: Filed Vitals:   10/02/13 1232  BP: 158/74  Pulse: 68  Temp: 98.4 F (36.9 C)  Resp: 18   Filed Vitals:   10/02/13 1232  Height: 5' 1.75" (1.568 m)  Weight: 210 lb 8 oz (95.482 kg)   Body mass index is 38.84 kg/(m^2).  General: Alert, no acute distress,  obese Caucasian female  HEENT:  Normocephalic, atraumatic, oropharynx patent. EOMI, PERRLA, fundoscopic exam nl Cardiovascular:  Regular rate and rhythm, no rubs murmurs or gallops.  No Carotid bruits, radial pulse intact. + minimal pedal edema.  Respiratory: Clear to auscultation bilaterally.  No wheezes, rales, or rhonchi.  No cyanosis, no use of accessory musculature GI: No organomegaly, abdomen is soft and non-tender, positive bowel sounds.  No masses. Skin: No rashes. Neurologic: Facial musculature symmetric. Psychiatric: Patient is appropriate throughout our interaction. Lymphatic: No cervical lymphadenopathy Musculoskeletal: Gait intact. No thyroidmegaly Full ROM of shoulders knees hips Normal UE and Romain Erion  5/5 DTR +  Small Heberdens nodes on hands   LABS: Results for orders placed in visit on 10/02/13  POCT GLYCOSYLATED HEMOGLOBIN (HGB A1C)      Result Value Range   Hemoglobin A1C 5.2       EKG/XRAY:   Primary read interpreted by Dr. Conley Rolls at [facility].   ASSESSMENT/PLAN: Encounter Diagnoses  Name Primary?  . HTN (hypertension) Yes  . Unspecified vitamin D deficiency   . Knee pain, bilateral   . Arthritis   . Depression   . Unspecified hypothyroidism   . History of diabetes mellitus    Refilled meds Lisinopril, Zoloft, Vit D New meds: HCTZ 12.5 mg daily , Tramadol and mobic prn Advise that she needs to be careful with NSAIDs  F/u in 1 month since adding HCTZ , bring bp logs Labs pending  Gross sideeffects, risk and benefits, and alternatives of medications d/w patient. Patient is aware that all medications have potential sideeffects and we are unable to predict every sideeffect or drug-drug interaction that may occur.  Hamilton Capri PHUONG, DO 10/02/2013 2:14 PM

## 2013-10-03 LAB — MICROALBUMIN, URINE: Microalb, Ur: 0.96 mg/dL (ref 0.00–1.89)

## 2013-10-03 LAB — VITAMIN D 25 HYDROXY (VIT D DEFICIENCY, FRACTURES): Vit D, 25-Hydroxy: 31 ng/mL (ref 30–89)

## 2013-11-02 ENCOUNTER — Other Ambulatory Visit: Payer: Self-pay | Admitting: Family Medicine

## 2013-11-03 ENCOUNTER — Other Ambulatory Visit: Payer: Self-pay | Admitting: Family Medicine

## 2013-11-29 ENCOUNTER — Other Ambulatory Visit: Payer: Self-pay | Admitting: Family Medicine

## 2013-12-08 ENCOUNTER — Other Ambulatory Visit: Payer: Self-pay | Admitting: Family Medicine

## 2013-12-09 ENCOUNTER — Ambulatory Visit (INDEPENDENT_AMBULATORY_CARE_PROVIDER_SITE_OTHER): Payer: Medicare HMO | Admitting: Family Medicine

## 2013-12-09 VITALS — BP 120/80 | HR 60 | Temp 98.1°F | Resp 16 | Ht 61.0 in | Wt 219.0 lb

## 2013-12-09 DIAGNOSIS — M129 Arthropathy, unspecified: Secondary | ICD-10-CM

## 2013-12-09 DIAGNOSIS — F3289 Other specified depressive episodes: Secondary | ICD-10-CM

## 2013-12-09 DIAGNOSIS — F411 Generalized anxiety disorder: Secondary | ICD-10-CM

## 2013-12-09 DIAGNOSIS — E559 Vitamin D deficiency, unspecified: Secondary | ICD-10-CM

## 2013-12-09 DIAGNOSIS — M1990 Unspecified osteoarthritis, unspecified site: Secondary | ICD-10-CM

## 2013-12-09 DIAGNOSIS — I10 Essential (primary) hypertension: Secondary | ICD-10-CM

## 2013-12-09 DIAGNOSIS — F329 Major depressive disorder, single episode, unspecified: Secondary | ICD-10-CM

## 2013-12-09 DIAGNOSIS — F32A Depression, unspecified: Secondary | ICD-10-CM

## 2013-12-09 LAB — COMPREHENSIVE METABOLIC PANEL WITH GFR
ALT: 23 U/L (ref 0–35)
AST: 27 U/L (ref 0–37)
Albumin: 4.2 g/dL (ref 3.5–5.2)
Alkaline Phosphatase: 85 U/L (ref 39–117)
Potassium: 4.2 meq/L (ref 3.5–5.3)
Sodium: 137 meq/L (ref 135–145)
Total Bilirubin: 0.7 mg/dL (ref 0.2–1.2)
Total Protein: 6.9 g/dL (ref 6.0–8.3)

## 2013-12-09 LAB — COMPREHENSIVE METABOLIC PANEL
BUN: 15 mg/dL (ref 6–23)
CO2: 25 mEq/L (ref 19–32)
Calcium: 8.8 mg/dL (ref 8.4–10.5)
Chloride: 103 mEq/L (ref 96–112)
Creat: 0.67 mg/dL (ref 0.50–1.10)
Glucose, Bld: 105 mg/dL — ABNORMAL HIGH (ref 70–99)

## 2013-12-09 MED ORDER — CLONAZEPAM 0.5 MG PO TABS
ORAL_TABLET | ORAL | Status: DC
Start: 2013-12-09 — End: 2014-02-28

## 2013-12-09 MED ORDER — HYDROCHLOROTHIAZIDE 12.5 MG PO CAPS: 12.5000 mg | ORAL_CAPSULE | Freq: Every day | ORAL | Status: DC

## 2013-12-09 MED ORDER — MELOXICAM 15 MG PO TABS: ORAL_TABLET | ORAL | Status: DC

## 2013-12-09 MED ORDER — VITAMIN D (ERGOCALCIFEROL) 1.25 MG (50000 UNIT) PO CAPS: 50000.0000 [IU] | ORAL_CAPSULE | ORAL | Status: DC

## 2013-12-09 NOTE — Progress Notes (Signed)
 Chief Complaint:  Chief Complaint  Patient presents with  . Follow-up    Hypertension, medication review    HPI: Nancy Hinton is a 68 y.o. female who is here for recheck on hTN She is on lisinopril but has been also taking HCTZ 12.5 mg She has gained weight since being on zoloft She has bilateral knee pain which is chronic, worse with weight gain Not ready to giv eup he independence, She fears disability. NO dizziness Feels tired not sure if zoloft is helping She has vitamin d def in the past Daughter is still going in and out of rehab, Colbert Ewing nd family life has not changed.   Past Medical History  Diagnosis Date  . Arthritis   . Hypertension   . Diabetes mellitus without complication     history of DM prior to gastric bypass, she has not had to be on medicine since 170 lb weightloss.   Past Surgical History  Procedure Laterality Date  . Tonsillectomy    . Gastric bypass    . Colon resection     History   Social History  . Marital Status: Divorced    Spouse Name: N/A    Number of Children: N/A  . Years of Education: N/A   Social History Main Topics  . Smoking status: Never Smoker   . Smokeless tobacco: Never Used  . Alcohol Use: No  . Drug Use: No  . Sexual Activity: No   Other Topics Concern  . None   Social History Narrative  . None   Family History  Problem Relation Age of Onset  . Alzheimer's disease Mother   . Heart disease Father   . Heart attack Maternal Grandmother    Allergies  Allergen Reactions  . Codeine Nausea Only   Prior to Admission medications   Medication Sig Start Date End Date Taking? Authorizing Provider  hydrochlorothiazide (MICROZIDE) 12.5 MG capsule Take 1 capsule (12.5 mg total) by mouth daily. PATIENT NEEDS BP FOLLOW UP FOR ADDITIONAL REFILLS 11/02/13  Yes  P , DO  levothyroxine (SYNTHROID, LEVOTHROID) 88 MCG tablet Take 1 tablet (88 mcg total) by mouth daily. 10/02/13  Yes  P , DO  lisinopril  (PRINIVIL,ZESTRIL) 20 MG tablet Take 1 tablet (20 mg total) by mouth daily. 10/02/13  Yes  P , DO  meloxicam (MOBIC) 15 MG tablet TAKE 1 TABLET BY MOUTH EVERY DAY WITH FOOD AS NEEDED   Yes  P , DO  Multiple Vitamin (MULITIVITAMIN WITH MINERALS) TABS Take 1 tablet by mouth daily.     Yes Historical Provider, MD  sertraline (ZOLOFT) 50 MG tablet Take 1 tablet (50 mg total) by mouth daily. 10/02/13  Yes  P , DO  traMADol (ULTRAM) 50 MG tablet Take 1 tablet (50 mg total) by mouth every 8 (eight) hours as needed. 10/02/13  Yes  P , DO  Vitamin D, Ergocalciferol, (DRISDOL) 50000 UNITS CAPS capsule Take 1 capsule (50,000 Units total) by mouth every 7 (seven) days. Take on Tuesday 10/02/13  Yes  P , DO     ROS: The patient denies fevers, chills, night sweats, unintentional weight loss, chest pain, palpitations, wheezing, dyspnea on exertion, nausea, vomiting, abdominal pain, dysuria, hematuria, melena, numbness, weakness, or tingling.   All other systems have been reviewed and were otherwise negative with the exception of those mentioned in the HPI and as above.    PHYSICAL EXAM: Filed Vitals:   12/09/13 1002  BP: 120/80  Pulse: 60  Temp: 98.1 F (36.7 C)  Resp: 16   Filed Vitals:   12/09/13 1002  Height: '5\' 1"'  (1.549 m)  Weight: 219 lb (99.338 kg)   Body mass index is 41.4 kg/(m^2).  General: Alert, no acute distress HEENT:  Normocephalic, atraumatic, oropharynx patent. EOMI, PERRLA Cardiovascular:  Regular rate and rhythm, no rubs murmurs or gallops.  No Carotid bruits, radial pulse intact. No pedal edema.  Respiratory: Clear to auscultation bilaterally.  No wheezes, rales, or rhonchi.  No cyanosis, no use of accessory musculature GI: No organomegaly, abdomen is soft and non-tender, positive bowel sounds.  No masses. Skin: No rashes. Neurologic: Facial musculature symmetric. Psychiatric: Patient is appropriate throughout our interaction. Lymphatic: No  cervical lymphadenopathy Musculoskeletal: Gait intact.   LABS: Results for orders placed in visit on 10/02/13  COMPREHENSIVE METABOLIC PANEL      Result Value Ref Range   Sodium 142  135 - 145 mEq/L   Potassium 4.0  3.5 - 5.3 mEq/L   Chloride 108  96 - 112 mEq/L   CO2 25  19 - 32 mEq/L   Glucose, Bld 102 (*) 70 - 99 mg/dL   BUN 15  6 - 23 mg/dL   Creat 0.62  0.50 - 1.10 mg/dL   Total Bilirubin 0.4  0.3 - 1.2 mg/dL   Alkaline Phosphatase 82  39 - 117 U/L   AST 20  0 - 37 U/L   ALT 18  0 - 35 U/L   Total Protein 6.2  6.0 - 8.3 g/dL   Albumin 3.9  3.5 - 5.2 g/dL   Calcium 8.1 (*) 8.4 - 10.5 mg/dL  MICROALBUMIN, URINE      Result Value Ref Range   Microalb, Ur 0.96  0.00 - 1.89 mg/dL  TSH      Result Value Ref Range   TSH 1.716  0.350 - 4.500 uIU/mL  RHEUMATOID FACTOR      Result Value Ref Range   Rheumatoid Factor <10  <=14 IU/mL  VITAMIN D 25 HYDROXY      Result Value Ref Range   Vit D, 25-Hydroxy 31  30 - 89 ng/mL  POCT GLYCOSYLATED HEMOGLOBIN (HGB A1C)      Result Value Ref Range   Hemoglobin A1C 5.2       EKG/XRAY:   Primary read interpreted by Dr. Marin Comment at [facility].   ASSESSMENT/PLAN: Encounter Diagnoses  Name Primary?  . HTN (hypertension) Yes  . Arthritis   . Depression   . Anxiety state, unspecified   . Unspecified vitamin D deficiency    [name] is here for med recheck, she is here for a myriad of reasons but primarily she is here because she had weight gain of about 8 lbs or more since she started on zoloft She also does not feel zoloft has helped with her life stressors at this higher dose, she has a daughter who is a cocaine abuser and is in and out of rehab, she is taking cae of her ailing father, her daughter's family is living with her. She also has arthritic pain in bilateral knees and because of her weight gain this has gotten worse and so she is unable to exercise.  I will taper down zoloft from 50 to 25 mg , I will supplement Klonopin po BID prn for  GAD/depressive sxs She will continue with mobic for knee pain sicne that helps, advise to take up water aerobics or low impact exercise like riding a stationary bike She will  take her HCTZ daily rather than every other day or 3 days sicne she has no SEs Rx klonopin BID prn F/u in 1 month  Gross sideeffects, risk and benefits, and alternatives of medications d/w patient. Patient is aware that all medications have potential sideeffects and we are unable to predict every sideeffect or drug-drug interaction that may occur.  , [place], DO 12/09/2013 11:49 AM

## 2013-12-21 ENCOUNTER — Encounter: Payer: Self-pay | Admitting: Family Medicine

## 2014-02-28 ENCOUNTER — Other Ambulatory Visit: Payer: Self-pay | Admitting: Family Medicine

## 2014-03-02 ENCOUNTER — Other Ambulatory Visit: Payer: Self-pay | Admitting: Family Medicine

## 2014-03-02 MED ORDER — CLONAZEPAM 0.5 MG PO TABS: ORAL_TABLET | ORAL | Status: DC

## 2014-03-02 NOTE — Telephone Encounter (Signed)
faxed

## 2014-04-09 ENCOUNTER — Other Ambulatory Visit: Payer: Self-pay | Admitting: Family Medicine

## 2014-04-09 NOTE — Telephone Encounter (Signed)
Spoke to pt transferred to billing to make an appt for repeat labs. Pt does not need a refill at this time.

## 2014-04-10 ENCOUNTER — Telehealth: Payer: Self-pay

## 2014-04-10 DIAGNOSIS — M1990 Unspecified osteoarthritis, unspecified site: Secondary | ICD-10-CM

## 2014-04-10 MED ORDER — MELOXICAM 15 MG PO TABS: ORAL_TABLET | ORAL | Status: DC

## 2014-04-10 NOTE — Telephone Encounter (Signed)
LMOM that rx was sent to pharm . 

## 2014-04-10 NOTE — Telephone Encounter (Signed)
Pt is scheduled to see dr Conley Rollsle on 04/27/14, but states she will be out of meloxicam before then and is requesting a refill Please call pt to advise

## 2014-04-10 NOTE — Telephone Encounter (Signed)
Meloxicam sent to pharmacy.

## 2014-04-24 ENCOUNTER — Other Ambulatory Visit: Payer: Self-pay | Admitting: Family Medicine

## 2014-04-27 ENCOUNTER — Encounter: Payer: Self-pay | Admitting: Family Medicine

## 2014-04-27 ENCOUNTER — Ambulatory Visit (INDEPENDENT_AMBULATORY_CARE_PROVIDER_SITE_OTHER): Payer: Medicare HMO | Admitting: Family Medicine

## 2014-04-27 VITALS — BP 152/80 | HR 56 | Temp 98.3°F | Resp 16 | Ht 62.0 in | Wt 226.6 lb

## 2014-04-27 DIAGNOSIS — M129 Arthropathy, unspecified: Secondary | ICD-10-CM

## 2014-04-27 DIAGNOSIS — F439 Reaction to severe stress, unspecified: Secondary | ICD-10-CM

## 2014-04-27 DIAGNOSIS — M1990 Unspecified osteoarthritis, unspecified site: Secondary | ICD-10-CM

## 2014-04-27 DIAGNOSIS — F329 Major depressive disorder, single episode, unspecified: Secondary | ICD-10-CM

## 2014-04-27 DIAGNOSIS — E559 Vitamin D deficiency, unspecified: Secondary | ICD-10-CM

## 2014-04-27 DIAGNOSIS — F32A Depression, unspecified: Secondary | ICD-10-CM

## 2014-04-27 DIAGNOSIS — I10 Essential (primary) hypertension: Secondary | ICD-10-CM

## 2014-04-27 DIAGNOSIS — Z639 Problem related to primary support group, unspecified: Secondary | ICD-10-CM

## 2014-04-27 DIAGNOSIS — E039 Hypothyroidism, unspecified: Secondary | ICD-10-CM

## 2014-04-27 DIAGNOSIS — F3289 Other specified depressive episodes: Secondary | ICD-10-CM

## 2014-04-27 LAB — COMPLETE METABOLIC PANEL WITH GFR
ALT: 18 U/L (ref 0–35)
Albumin: 3.7 g/dL (ref 3.5–5.2)
Alkaline Phosphatase: 98 U/L (ref 39–117)
CO2: 27 mEq/L (ref 19–32)
Chloride: 105 mEq/L (ref 96–112)
GFR, Est African American: >89 mL/min
GFR, Est Non African American: 78 mL/min
Glucose, Bld: 90 mg/dL (ref 70–99)
Potassium: 4.2 mEq/L (ref 3.5–5.3)
Sodium: 141 mEq/L (ref 135–145)
Total Protein: 6.3 g/dL (ref 6.0–8.3)

## 2014-04-27 LAB — COMPLETE METABOLIC PANEL WITHOUT GFR
AST: 21 U/L (ref 0–37)
BUN: 13 mg/dL (ref 6–23)
Calcium: 8.3 mg/dL — ABNORMAL LOW (ref 8.4–10.5)
Creat: 0.79 mg/dL (ref 0.50–1.10)
Total Bilirubin: 0.7 mg/dL (ref 0.2–1.2)

## 2014-04-27 LAB — TSH: TSH: 1.462 u[IU]/mL (ref 0.350–4.500)

## 2014-04-27 MED ORDER — LISINOPRIL 20 MG PO TABS: 20.0000 mg | ORAL_TABLET | Freq: Every day | ORAL | Status: DC

## 2014-04-27 MED ORDER — CLONAZEPAM 0.5 MG PO TABS: ORAL_TABLET | ORAL | Status: DC

## 2014-04-27 MED ORDER — MELOXICAM 15 MG PO TABS: ORAL_TABLET | ORAL | Status: DC

## 2014-04-27 MED ORDER — HYDROCHLOROTHIAZIDE 12.5 MG PO CAPS: 12.5000 mg | ORAL_CAPSULE | Freq: Every day | ORAL | Status: DC

## 2014-04-27 MED ORDER — LEVOTHYROXINE SODIUM 88 MCG PO TABS: 88.0000 ug | ORAL_TABLET | Freq: Every day | ORAL | Status: DC

## 2014-04-27 MED ORDER — ESCITALOPRAM OXALATE 10 MG PO TABS: 10.0000 mg | ORAL_TABLET | Freq: Every day | ORAL | Status: DC

## 2014-04-27 MED ORDER — VITAMIN D (ERGOCALCIFEROL) 1.25 MG (50000 UNIT) PO CAPS: 50000.0000 [IU] | ORAL_CAPSULE | ORAL | Status: DC

## 2014-04-27 NOTE — Progress Notes (Signed)
Chief Complaint:  Chief Complaint  Patient presents with  . Medication Refill    all medications    HPI: Nancy Hinton is a 68 y.o. female who is here for  Medication refills She is doing well without any SEs She is still struggling with the same issues, daughter with drug abuse, father just came back from hospital and is requiring assistance She still working in [place] and returning on the weekends She tapered off zoloft since not effective, she has been dealing it through support groups She denies SI/HI/hallucinations She denies CP/dizziness, SOB, palpitations  Past Medical History  Diagnosis Date  . Arthritis   . Hypertension   . Diabetes mellitus without complication     history of DM prior to gastric bypass, she has not had to be on medicine since 170 lb weightloss.   Past Surgical History  Procedure Laterality Date  . Tonsillectomy    . Gastric bypass    . Colon resection     History   Social History  . Marital Status: Divorced    Spouse Name: N/A    Number of Children: N/A  . Years of Education: N/A   Social History Main Topics  . Smoking status: Never Smoker   . Smokeless tobacco: Never Used  . Alcohol Use: No  . Drug Use: No  . Sexual Activity: No   Other Topics Concern  . None   Social History Narrative  . None   Family History  Problem Relation Age of Onset  . Alzheimer's disease Mother   . Heart disease Father   . Heart attack Maternal Grandmother    Allergies  Allergen Reactions  . Codeine Nausea Only   Prior to Admission medications   Medication Sig Start Date End Date Taking? Authorizing Provider  clonazePAM (KLONOPIN) 0.5 MG tablet TAKE 1/2 TO 1 TABLET BY MOUTH TWICE DAILY AS NEEDED 03/02/14  Yes Adelia Baptista P Eliza Green, DO  hydrochlorothiazide (MICROZIDE) 12.5 MG capsule Take 1 capsule (12.5 mg total) by mouth daily. 12/09/13  Yes Doryan Bahl P Saharah Sherrow, DO  levothyroxine (SYNTHROID, LEVOTHROID) 88 MCG tablet TAKE 1 TABLET BY MOUTH EVERY DAY   Yes  Chelle S Jeffery, PA-C  lisinopril (PRINIVIL,ZESTRIL) 20 MG tablet Take 1 tablet (20 mg total) by mouth daily. 10/02/13  Yes Reda Gettis P Tylea Hise, DO  meloxicam (MOBIC) 15 MG tablet TAKE 1 TABLET BY MOUTH EVERY DAY WITH FOOD AS NEEDED 04/10/14  Yes Eleanore E Egan, PA-C  Multiple Vitamin (MULITIVITAMIN WITH MINERALS) TABS Take 1 tablet by mouth daily.     Yes Historical Provider, MD  Vitamin D, Ergocalciferol, (DRISDOL) 50000 UNITS CAPS capsule Take 1 capsule (50,000 Units total) by mouth every 7 (seven) days. Take on Tuesday 12/09/13  Yes Elbony Mcclimans P Paschal Blanton, DO  sertraline (ZOLOFT) 50 MG tablet Take 1 tablet (50 mg total) by mouth daily. 10/02/13   Teigen Bellin P Nial Hawe, DO     ROS: The patient denies fevers, chills, night sweats, unintentional weight loss, chest pain, palpitations, wheezing, dyspnea on exertion, nausea, vomiting, abdominal pain, dysuria, hematuria, melena, numbness, weakness, or tingling.   All other systems have been reviewed and were otherwise negative with the exception of those mentioned in the HPI and as above.    PHYSICAL EXAM: Filed Vitals:   04/27/14 0933  BP: 152/80  Pulse: 56  Temp: 98.3 F (36.8 C)  Resp: 16   Filed Vitals:   04/27/14 0933  Height: 5\' 2"  (1.575 m)  Weight: 226 lb 9.6  oz (102.785 kg)   Body mass index is 41.44 kg/(m^2).  General: Alert, no acute distress, obese HEENT:  Normocephalic, atraumatic, oropharynx patent. EOMI, PERRLA Cardiovascular:  Regular rate and rhythm, no rubs murmurs or gallops.  No Carotid bruits, radial pulse intact. Minimal pedal edema.  Respiratory: Clear to auscultation bilaterally.  No wheezes, rales, or rhonchi.  No cyanosis, no use of accessory musculature GI: No organomegaly, abdomen is soft and non-tender, positive bowel sounds.  No masses. Skin: No rashes. Neurologic: Facial musculature symmetric. Psychiatric: Patient is appropriate throughout our interaction. Lymphatic: No cervical lymphadenopathy Musculoskeletal: Gait  intact.   LABS: Results for orders placed in visit on 12/09/13  COMPREHENSIVE METABOLIC PANEL      Result Value Ref Range   Sodium 137  135 - 145 mEq/L   Potassium 4.2  3.5 - 5.3 mEq/L   Chloride 103  96 - 112 mEq/L   CO2 25  19 - 32 mEq/L   Glucose, Bld 105 (*) 70 - 99 mg/dL   BUN 15  6 - 23 mg/dL   Creat 1.610.67  0.960.50 - 0.451.10 mg/dL   Total Bilirubin 0.7  0.2 - 1.2 mg/dL   Alkaline Phosphatase 85  39 - 117 U/L   AST 27  0 - 37 U/L   ALT 23  0 - 35 U/L   Total Protein 6.9  6.0 - 8.3 g/dL   Albumin 4.2  3.5 - 5.2 g/dL   Calcium 8.8  8.4 - 40.910.5 mg/dL     EKG/XRAY:   Primary read interpreted [facility].   ASSESSMENT/PLAN: Encounter Diagnoses  Name Primary?  . Essential hypertension Yes  . Arthritis   . Unspecified hypothyroidism   . Depression   . Stress at home   . Unspecified vitamin D deficiency    Labs pedning REfileld chronic medications We will do a trial of lexapro to see if her mood improves  F/u in 2 month , monitor BP  Gross sideeffects, risk and benefits, and alternatives of medications d/w patient. Patient is aware that all medications have potential sideeffects and we are unable to predict every sideeffect or drug-drug interaction that may occur.  Hamilton CapriLE, Lamisha Roussell PHUONG, DO 04/27/2014 12:42 PM

## 2014-06-21 ENCOUNTER — Other Ambulatory Visit: Payer: Self-pay | Admitting: Family Medicine

## 2014-07-21 ENCOUNTER — Other Ambulatory Visit: Payer: Self-pay | Admitting: Family Medicine

## 2014-07-23 ENCOUNTER — Other Ambulatory Visit: Payer: Self-pay | Admitting: Physician Assistant

## 2014-08-10 ENCOUNTER — Ambulatory Visit: Payer: Medicare HMO | Admitting: Family Medicine

## 2014-08-17 ENCOUNTER — Ambulatory Visit: Payer: Medicare HMO | Admitting: Family Medicine

## 2014-08-20 ENCOUNTER — Other Ambulatory Visit: Payer: Self-pay | Admitting: Family Medicine

## 2014-08-31 ENCOUNTER — Ambulatory Visit: Payer: Medicare HMO | Admitting: Family Medicine

## 2014-09-07 ENCOUNTER — Encounter: Payer: Self-pay | Admitting: Family Medicine

## 2014-09-07 ENCOUNTER — Ambulatory Visit (INDEPENDENT_AMBULATORY_CARE_PROVIDER_SITE_OTHER): Payer: Medicare HMO | Admitting: Family Medicine

## 2014-09-07 VITALS — BP 132/74 | HR 80 | Temp 98.4°F | Resp 18 | Ht 62.0 in | Wt 238.0 lb

## 2014-09-07 DIAGNOSIS — I10 Essential (primary) hypertension: Secondary | ICD-10-CM

## 2014-09-07 DIAGNOSIS — R609 Edema, unspecified: Secondary | ICD-10-CM

## 2014-09-07 DIAGNOSIS — E559 Vitamin D deficiency, unspecified: Secondary | ICD-10-CM

## 2014-09-07 DIAGNOSIS — Z23 Encounter for immunization: Secondary | ICD-10-CM

## 2014-09-07 DIAGNOSIS — L299 Pruritus, unspecified: Secondary | ICD-10-CM

## 2014-09-07 DIAGNOSIS — Z13220 Encounter for screening for lipoid disorders: Secondary | ICD-10-CM

## 2014-09-07 DIAGNOSIS — G4700 Insomnia, unspecified: Secondary | ICD-10-CM

## 2014-09-07 DIAGNOSIS — M25569 Pain in unspecified knee: Secondary | ICD-10-CM

## 2014-09-07 DIAGNOSIS — R635 Abnormal weight gain: Secondary | ICD-10-CM

## 2014-09-07 DIAGNOSIS — F439 Reaction to severe stress, unspecified: Secondary | ICD-10-CM

## 2014-09-07 DIAGNOSIS — G8929 Other chronic pain: Secondary | ICD-10-CM

## 2014-09-07 DIAGNOSIS — Z638 Other specified problems related to primary support group: Secondary | ICD-10-CM

## 2014-09-07 DIAGNOSIS — E039 Hypothyroidism, unspecified: Secondary | ICD-10-CM

## 2014-09-07 LAB — COMPLETE METABOLIC PANEL WITHOUT GFR
ALT: 16 U/L (ref 0–35)
GFR, Est African American: >89 mL/min
GFR, Est Non African American: 87 mL/min
Glucose, Bld: 107 mg/dL — ABNORMAL HIGH (ref 70–99)
Total Bilirubin: 0.6 mg/dL (ref 0.2–1.2)

## 2014-09-07 LAB — LIPID PANEL
Cholesterol: 167 mg/dL (ref 0–200)
HDL: 57 mg/dL (ref >39)
LDL Cholesterol: 96 mg/dL (ref 0–99)
Total CHOL/HDL Ratio: 2.9 Ratio
Triglycerides: 70 mg/dL (ref <150)
VLDL: 14 mg/dL (ref 0–40)

## 2014-09-07 LAB — CBC
HCT: 34.8 % — ABNORMAL LOW (ref 36.0–46.0)
Hemoglobin: 11.1 g/dL — ABNORMAL LOW (ref 12.0–15.0)
MCH: 25.3 pg — ABNORMAL LOW (ref 26.0–34.0)
MCHC: 31.9 g/dL (ref 30.0–36.0)
MCV: 79.5 fL (ref 78.0–100.0)
MPV: 9.3 fL — ABNORMAL LOW (ref 9.4–12.4)
Platelets: 333 10*3/uL (ref 150–400)
RBC: 4.38 MIL/uL (ref 3.87–5.11)
RDW: 14.8 % (ref 11.5–15.5)
WBC: 5.6 10*3/uL (ref 4.0–10.5)

## 2014-09-07 LAB — COMPLETE METABOLIC PANEL WITH GFR
AST: 20 U/L (ref 0–37)
Albumin: 3.6 g/dL (ref 3.5–5.2)
Alkaline Phosphatase: 87 U/L (ref 39–117)
BUN: 19 mg/dL (ref 6–23)
CO2: 24 mEq/L (ref 19–32)
Calcium: 8.3 mg/dL — ABNORMAL LOW (ref 8.4–10.5)
Chloride: 107 mEq/L (ref 96–112)
Creat: 0.72 mg/dL (ref 0.50–1.10)
Potassium: 4.4 mEq/L (ref 3.5–5.3)
Sodium: 139 mEq/L (ref 135–145)
Total Protein: 5.9 g/dL — ABNORMAL LOW (ref 6.0–8.3)

## 2014-09-07 LAB — TSH: TSH: 1.732 u[IU]/mL (ref 0.350–4.500)

## 2014-09-07 MED ORDER — CLONAZEPAM 0.5 MG PO TABS: ORAL_TABLET | ORAL | Status: DC

## 2014-09-07 MED ORDER — VITAMIN D (ERGOCALCIFEROL) 1.25 MG (50000 UNIT) PO CAPS: 50000.0000 [IU] | ORAL_CAPSULE | ORAL | Status: DC

## 2014-09-07 NOTE — Progress Notes (Signed)
Chief Complaint:  Chief Complaint  Patient presents with  . Leg Swelling    states gaining weight, but not eating  . Rash    arms/ legs itch esp at night  . Medication Refill    ran out of Klonopin and has d/c lexapro states doing well with her depression currently    HPI: Nancy Hinton is a 68 y.o. female who is here for f/u for HTN and also her arthritis and GAD/Depression  1. She has been taking her BP and is doing well on her meds, she is on lisinopril and HCTZ 2. 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 Sh ehas not really exercised, she has not changed how she eats. No new meds, no new detergents, no new lotions.  3. She ahs been takingmobic for her arthritis daily , she has never ahd itchiness witheither lisinopril or mobic. The mobic was given to her by me for tricompartment arthritis in knees 4. She has less stress due to changes in jobs and she does not have to travel as much, seh ahs set bounderaies for her daughter and her father, she is feeling better and does not want to be on any SSRIs, she feel sthe klonopin helps her but is out of the meds, does not seelp well, gets about 2-3 hrs of sleep at a time, seh wakes up worried abotu things and can't g back to sleep. She wants to try life style changes and support group  So she does not have to be on SSRI,  As a recap her daughter is a drug abuser and her father is very demanding, both are very dependent onher for everything.   Wt Readings from Last 3 Encounters:  09/07/14 238 lb (107.956 kg)  04/27/14 226 lb 9.6 oz (102.785 kg)  12/09/13 219 lb (99.338 kg)   BP Readings from Last 3 Encounters:  09/07/14 132/74  04/27/14 152/80  12/09/13 120/80     Past Medical History  Diagnosis Date  . Arthritis   .  Hypertension   . Diabetes mellitus without complication     history of DM prior to gastric bypass, she has not had to be on medicine since 170 lb weightloss.   Past Surgical History  Procedure Laterality Date  . Tonsillectomy    . Gastric bypass    . Colon resection     History   Social History  . Marital Status: Divorced    Spouse Name: N/A    Number of Children: N/A  . Years of Education: N/A   Social History Main Topics  . Smoking status: Never Smoker   . Smokeless tobacco: Never Used  . Alcohol Use: No  . Drug Use: No  . Sexual Activity: No   Other Topics Concern  . None   Social History Narrative   Family History  Problem Relation Age of Onset  . Alzheimer's disease Mother   . Heart disease Father   . Heart attack Maternal Grandmother    Allergies  Allergen Reactions  . Codeine Nausea Only   Prior to Admission medications   Medication Sig Start Date End Date Taking? Authorizing Provider  hydrochlorothiazide (MICROZIDE) 12.5 MG capsule Take 1 capsule (12.5 mg total) by mouth daily. 04/27/14  Yes Darrielle Pflieger P Daila Elbert, DO  levothyroxine (SYNTHROID, LEVOTHROID) 88 MCG tablet TAKE 1 TABLET BY MOUTH EVERY DAY 07/23/14  Yes Chelle S Jeffery, PA-C  lisinopril (PRINIVIL,ZESTRIL) 20 MG tablet Take 1 tablet (20 mg total) by mouth daily. 04/27/14  Yes Carlisha Wisler P Cacey Willow, DO  meloxicam (MOBIC) 15 MG tablet TAKE 1 TABLET BY MOUTH EVERY DAY WITH FOOD AS NEEDED 04/27/14  Yes Ricci Paff P Gypsy Kellogg, DO  Multiple Vitamin (MULITIVITAMIN WITH MINERALS) TABS Take 1 tablet by mouth daily.     Yes Historical Provider, MD  Vitamin D, Ergocalciferol, (DRISDOL) 50000 UNITS CAPS capsule Take 1 capsule (50,000 Units total) by mouth every 7 (seven) days. Take on Tuesday 04/27/14  Yes Bufford Helms P Chandler Swiderski, DO  clonazePAM (KLONOPIN) 0.5 MG tablet TAKE 1/2 TO 1 TABLET BY MOUTH TWICE DAILY AS NEEDED Patient not taking: Reported on 09/07/2014 04/27/14   Keashia Haskins P Aishani Kalis, DO  escitalopram (LEXAPRO) 10 MG tablet TAKE 1 TABLET BY MOUTH EVERY  DAY Patient not taking: Reported on 09/07/2014 08/20/14   Chelle S Jeffery, PA-C  sertraline (ZOLOFT) 50 MG tablet Take 1 tablet (50 mg total) by mouth daily. Patient not taking: Reported on 09/07/2014 10/02/13   Porsha Skilton P Skyelar Halliday, DO     ROS: The patient denies fevers, chills, night sweats, unintentional weight loss, chest pain, palpitations, wheezing, dyspnea on exertion, nausea, vomiting, abdominal pain, dysuria, hematuria, melena, numbness, weakness, or tingling.   All other systems have been reviewed and were otherwise negative with the exception of those mentioned in the HPI and as above.    PHYSICAL EXAM: Filed Vitals:   09/07/14 0824  BP: 132/74  Pulse: 80  Temp: 98.4 F (36.9 C)  Resp: 18   Filed Vitals:   09/07/14 0824  Height: 5\' 2"  (1.575 m)  Weight: 238 lb (107.956 kg)   Body mass index is 43.52 kg/(m^2).  General: Alert, no acute distress HEENT:  Normocephalic, atraumatic, oropharynx patent. EOMI, PERRLA Cardiovascular:  Regular rate and rhythm, no rubs murmurs or gallops.  No Carotid bruits, radial pulse intact. No pedal edema.  Respiratory: Clear to auscultation bilaterally.  No wheezes, rales, or rhonchi.  No cyanosis, no use of accessory musculature GI: No organomegaly, abdomen is soft and non-tender, positive bowel sounds.  No masses. Skin: No rashes. Neurologic: Facial musculature symmetric. Psychiatric: Patient is appropriate throughout our interaction. Lymphatic: No cervical lymphadenopathy Musculoskeletal: Gait intact.   LABS: Results for orders placed or performed in visit on 04/27/14  COMPLETE METABOLIC PANEL WITH GFR  Result Value Ref Range   Sodium 141 135 - 145 mEq/L   Potassium 4.2 3.5 - 5.3 mEq/L   Chloride 105 96 - 112 mEq/L   CO2 27 19 - 32 mEq/L   Glucose, Bld 90 70 - 99 mg/dL   BUN 13 6 - 23 mg/dL   Creat 4.090.79 8.110.50 - 9.141.10 mg/dL   Total Bilirubin 0.7 0.2 - 1.2 mg/dL   Alkaline Phosphatase 98 39 - 117 U/L   AST 21 0 - 37 U/L   ALT 18 0 - 35  U/L   Total Protein 6.3 6.0 - 8.3 g/dL   Albumin 3.7 3.5 - 5.2 g/dL   Calcium 8.3 (L) 8.4 - 10.5 mg/dL   GFR, Est African American >89 mL/min   GFR, Est Non African American 78 mL/min  TSH  Result Value Ref Range   TSH 1.462 0.350 - 4.500 uIU/mL     EKG/XRAY:   Primary read [facility]  ASSESSMENT/PLAN: Encounter Diagnoses  Name Primary?  . Stress at home   . Essential hypertension   . Vitamin D deficiency   . Insomnia   . Edema Yes  . Weight gain   . Hypothyroidism, unspecified hypothyroidism type   . Itching   . Knee pain, chronic, unspecified laterality   . Screening for hyperlipidemia   . Flu vaccine need    Pruritis-? mobic vs lisinopril , will do a trial of stopping itching and see if it helps, She can also take beandryl to see if this stops, in worse case scenario she can take prednisone but she doe snot want to do this right now Weight gain and edema-bialteral, she is not having CHF sxs, CP or SOB, she is very sedentary and perhaps this is the main cause and was also on Lexapro for several months, will try a trial of increasing HCTZ to 25 mg for 2-3 days and then return to 12.5 mg daily Continue with Lisinopril for HTN for now I advised her she can stop the Lexapro but need to taper down Refilled Klonopin for insomnia and GAD prn  She can try natural remedies like tumeric, tart cherry and tylenol prn for arthritis Labs pendinf Denies SI/HI/hallucinations F/u  In 1 week with status of leg swelling/itchiness, otherwise in 3 months  Gross sideeffects, risk and benefits, and alternatives of medications d/w patient. Patient is aware that all medications have potential sideeffects and we are unable to predict every sideeffect or drug-drug interaction that may occur.  Anona Giovannini PHUONG, DO 09/07/2014 9:09 AM

## 2014-09-08 LAB — VITAMIN D 25 HYDROXY (VIT D DEFICIENCY, FRACTURES): Vit D, 25-Hydroxy: 28 ng/mL — ABNORMAL LOW (ref 30–100)

## 2014-09-18 ENCOUNTER — Other Ambulatory Visit: Payer: Self-pay | Admitting: Family Medicine

## 2014-09-18 ENCOUNTER — Telehealth: Payer: Self-pay | Admitting: *Deleted

## 2014-09-18 ENCOUNTER — Encounter: Payer: Self-pay | Admitting: Family Medicine

## 2014-09-18 DIAGNOSIS — D649 Anemia, unspecified: Secondary | ICD-10-CM

## 2014-09-18 NOTE — Telephone Encounter (Signed)
Already done

## 2014-09-18 NOTE — Telephone Encounter (Signed)
Can you please review labs from 11/27? Pt called about those lab results.

## 2014-10-01 ENCOUNTER — Other Ambulatory Visit: Payer: Self-pay | Admitting: Physician Assistant

## 2014-10-20 ENCOUNTER — Other Ambulatory Visit: Payer: Self-pay | Admitting: Family Medicine

## 2014-11-10 ENCOUNTER — Other Ambulatory Visit (INDEPENDENT_AMBULATORY_CARE_PROVIDER_SITE_OTHER): Payer: Medicare HMO | Admitting: Radiology

## 2014-11-10 DIAGNOSIS — D649 Anemia, unspecified: Secondary | ICD-10-CM

## 2014-11-10 LAB — IRON: Iron: 28 ug/dL — ABNORMAL LOW (ref 42–145)

## 2014-11-10 LAB — POCT CBC
Granulocyte percent: 61.3 % (ref 37–80)
HCT, POC: 34.3 % — AB (ref 37.7–47.9)
Hemoglobin: 10.7 g/dL — AB (ref 12.2–16.2)
Lymph, poc: 1.7 (ref 0.6–3.4)
MCH, POC: 25.3 pg — AB (ref 27–31.2)
MCHC: 31.2 g/dL — AB (ref 31.8–35.4)
MCV: 81.3 fL (ref 80–97)
MID (cbc): 0.3 (ref 0–0.9)
MPV: 7.2 fL (ref 0–99.8)
POC Granulocyte: 3.2 (ref 2–6.9)
POC LYMPH PERCENT: 32.9 % (ref 10–50)
POC MID %: 5.8 %M (ref 0–12)
Platelet Count, POC: 353 10*3/uL (ref 142–424)
RBC: 4.22 M/uL (ref 4.04–5.48)
RDW, POC: 16.8 %
WBC: 5.3 10*3/uL (ref 4.6–10.2)

## 2014-11-10 LAB — FERRITIN: Ferritin: 7 ng/mL — ABNORMAL LOW (ref 10–291)

## 2014-11-10 LAB — IBC PANEL
%SAT: 7 % — ABNORMAL LOW (ref 20–55)
TIBC: 424 ug/dL (ref 250–470)
UIBC: 396 ug/dL (ref 125–400)

## 2014-11-10 NOTE — Progress Notes (Signed)
Pt here for labs only. 

## 2014-11-13 ENCOUNTER — Telehealth: Payer: Self-pay | Admitting: *Deleted

## 2014-11-13 ENCOUNTER — Ambulatory Visit (INDEPENDENT_AMBULATORY_CARE_PROVIDER_SITE_OTHER): Payer: Medicare HMO | Admitting: Family Medicine

## 2014-11-13 ENCOUNTER — Telehealth: Payer: Self-pay | Admitting: Family Medicine

## 2014-11-13 VITALS — BP 132/82 | HR 63 | Temp 98.0°F | Resp 17 | Ht 62.0 in | Wt 238.0 lb

## 2014-11-13 DIAGNOSIS — D509 Iron deficiency anemia, unspecified: Secondary | ICD-10-CM

## 2014-11-13 DIAGNOSIS — Z791 Long term (current) use of non-steroidal anti-inflammatories (NSAID): Secondary | ICD-10-CM

## 2014-11-13 DIAGNOSIS — G4700 Insomnia, unspecified: Secondary | ICD-10-CM

## 2014-11-13 DIAGNOSIS — Z638 Other specified problems related to primary support group: Secondary | ICD-10-CM

## 2014-11-13 DIAGNOSIS — F439 Reaction to severe stress, unspecified: Secondary | ICD-10-CM

## 2014-11-13 MED ORDER — CLONAZEPAM 0.5 MG PO TABS: ORAL_TABLET | ORAL | Status: DC

## 2014-11-13 MED ORDER — FERROUS SULFATE 325 (65 FE) MG PO TABS: 325.0000 mg | ORAL_TABLET | Freq: Two times a day (BID) | ORAL | Status: DC

## 2014-11-13 NOTE — Telephone Encounter (Signed)
error 

## 2014-11-13 NOTE — Progress Notes (Signed)
Chief Complaint:  Chief Complaint  Patient presents with  . Follow-up    lab results     HPI: Nancy Hinton is a 69 y.o. female who is here for anemia workup, she has iron def anemia, taking only prenatal vitamin She is off mobic , she stopped her mobic in 08/2014. She was making her itchy and also bloated. Now she is better Worried about weight gain, she was supposed to have a less stressful job, but she is now working full time and in [place] everyday She is not having CP or SOb or dizziness   Wt Readings from Last 3 Encounters:  11/13/14 238 lb (107.956 kg)  11/10/14 239 lb 12.8 oz (108.773 kg)  09/07/14 238 lb (107.956 kg)     Past Medical History  Diagnosis Date  . Arthritis   . Hypertension   . Diabetes mellitus without complication     history of DM prior to gastric bypass, she has not had to be on medicine since 170 lb weightloss.   Past Surgical History  Procedure Laterality Date  . Tonsillectomy    . Gastric bypass    . Colon resection     History   Social History  . Marital Status: Divorced    Spouse Name: N/A    Number of Children: N/A  . Years of Education: N/A   Social History Main Topics  . Smoking status: Never Smoker   . Smokeless tobacco: Never Used  . Alcohol Use: No  . Drug Use: No  . Sexual Activity: No   Other Topics Concern  . None   Social History Narrative   Family History  Problem Relation Age of Onset  . Alzheimer's disease Mother   . Heart disease Father   . Heart attack Maternal Grandmother    Allergies  Allergen Reactions  . Codeine Nausea Only   Prior to Admission medications   Medication Sig Start Date End Date Taking? Authorizing Provider  clonazePAM (KLONOPIN) 0.5 MG tablet TAKE 1-2 TABLET BY MOUTH TWICE DAILY AS NEEDED 09/07/14  Yes Nicholi Ghuman P Sukhraj Esquivias, DO  hydrochlorothiazide (MICROZIDE) 12.5 MG capsule Take 1 capsule (12.5 mg total) by mouth daily. 04/27/14  Yes Edina Winningham P Lisanne Ponce, DO  levothyroxine (SYNTHROID,  LEVOTHROID) 88 MCG tablet TAKE 1 TABLET BY MOUTH EVERY DAY 07/23/14  Yes Chelle S Jeffery, PA-C  lisinopril (PRINIVIL,ZESTRIL) 20 MG tablet Take 1 tablet (20 mg total) by mouth daily. 04/27/14  Yes Chandra Asher P Albie Bazin, DO  Multiple Vitamin (MULITIVITAMIN WITH MINERALS) TABS Take 1 tablet by mouth daily.     Yes Historical Provider, MD  Vitamin D, Ergocalciferol, (DRISDOL) 50000 UNITS CAPS capsule Take 1 capsule (50,000 Units total) by mouth every 7 (seven) days. Take on Tuesday 09/07/14  Yes Rage Beever P Tylie Golonka, DO  meloxicam (MOBIC) 15 MG tablet TAKE 1 TABLET BY MOUTH EVERY DAY WITH FOOD AS NEEDED Patient not taking: Reported on 11/13/2014 04/27/14   Clois Montavon P Elisama Thissen, DO     ROS: The patient denies fevers, chills, night sweats, unintentional weight loss, chest pain, palpitations, wheezing, dyspnea on exertion, nausea, vomiting, abdominal pain, dysuria, hematuria, melena, numbness, weakness, or tingling.   All other systems have been reviewed and were otherwise negative with the exception of those mentioned in the HPI and as above.    PHYSICAL EXAM: Filed Vitals:   11/13/14 1745  BP: 132/82  Pulse: 63  Temp: 98 F (36.7 C)  Resp: 17   Filed Vitals:   11/13/14  1745  Height: 5\' 2"  (1.575 m)  Weight: 238 lb (107.956 kg)   Body mass index is 43.52 kg/(m^2).  General: Alert, no acute distress HEENT:  Normocephalic, atraumatic, oropharynx patent. EOMI, PERRLA Cardiovascular:  Regular rate and rhythm, no rubs murmurs or gallops.  No Carotid bruits, radial pulse intact. + pedal edema.  Respiratory: Clear to auscultation bilaterally.  No wheezes, rales, or rhonchi.  No cyanosis, no use of accessory musculature GI: No organomegaly, abdomen is soft and non-tender, positive bowel sounds.  No masses. Skin: No rashes. Neurologic: Facial musculature symmetric. Psychiatric: Patient is appropriate throughout our interaction. Lymphatic: No cervical lymphadenopathy Musculoskeletal: Gait intact.   LABS: Results for orders  placed or performed in visit on 11/10/14  IBC Panel  Result Value Ref Range   UIBC 396 125 - 400 ug/dL   TIBC 409424 811250 - 914470 ug/dL   %SAT 7 (L) 20 - 55 %  Ferritin  Result Value Ref Range   Ferritin 7 (L) 10 - 291 ng/mL  Iron  Result Value Ref Range   Iron 28 (L) 42 - 145 ug/dL  POCT CBC  Result Value Ref Range   WBC 5.3 4.6 - 10.2 K/uL   Lymph, poc 1.7 0.6 - 3.4   POC LYMPH PERCENT 32.9 10 - 50 %L   MID (cbc) 0.3 0 - 0.9   POC MID % 5.8 0 - 12 %M   POC Granulocyte 3.2 2 - 6.9   Granulocyte percent 61.3 37 - 80 %G   RBC 4.22 4.04 - 5.48 M/uL   Hemoglobin 10.7 (A) 12.2 - 16.2 g/dL   HCT, POC 78.234.3 (A) 95.637.7 - 47.9 %   MCV 81.3 80 - 97 fL   MCH, POC 25.3 (A) 27 - 31.2 pg   MCHC 31.2 (A) 31.8 - 35.4 g/dL   RDW, POC 21.316.8 %   Platelet Count, POC 353 142 - 424 K/uL   MPV 7.2 0 - 99.8 fL     EKG/XRAY:   Primary read [facility].   ASSESSMENT/PLAN: Encounter Diagnoses  Name Primary?  Marland Kitchen. Anemia, iron deficiency Yes  . NSAID long-term use   . Stress at home   . Insomnia    Will do stool occult at home, if positive then will refer to GI, has never had colonscopy Iron 325 mg BID, prenatal Stop NSAIDs Refill Klonopin F/u iin 1 month  Gross sideeffects, risk and benefits, and alternatives of medications d/w patient. Patient is aware that all medications have potential sideeffects and we are unable to predict every sideeffect or drug-drug interaction that may occur.  Carmela Piechowski PHUONG, DO 11/13/2014 7:05 PM

## 2014-11-13 NOTE — Patient Instructions (Signed)
Iron Deficiency Anemia Anemia is a condition in which there are less red blood cells or hemoglobin in the blood than normal. Hemoglobin is the part of red blood cells that carries oxygen. Iron deficiency anemia is anemia caused by too little iron. It is the most common type of anemia. It may leave you tired and short of breath. CAUSES   Lack of iron in the diet.  Poor absorption of iron, as seen with intestinal disorders.  Intestinal bleeding.  Heavy periods. SIGNS AND SYMPTOMS  Mild anemia may not be noticeable. Symptoms may include:  Fatigue.  Headache.  Pale skin.  Weakness.  Tiredness.  Shortness of breath.  Dizziness.  Cold hands and feet.  Fast or irregular heartbeat. DIAGNOSIS  Diagnosis requires a thorough evaluation and physical exam by your health care provider. Blood tests are generally used to confirm iron deficiency anemia. Additional tests may be done to find the underlying cause of your anemia. These may include:  Testing for blood in the stool (fecal occult blood test).  A procedure to see inside the colon and rectum (colonoscopy).  A procedure to see inside the esophagus and stomach (endoscopy). TREATMENT  Iron deficiency anemia is treated by correcting the cause of the deficiency. Treatment may involve:  Adding iron-rich foods to your diet.  Taking iron supplements. Pregnant or breastfeeding women need to take extra iron because their normal diet usually does not provide the required amount.  Taking vitamins. Vitamin C improves the absorption of iron. Your health care provider may recommend that you take your iron tablets with a glass of orange juice or vitamin C supplement.  Medicines to make heavy menstrual flow lighter.  Surgery. HOME CARE INSTRUCTIONS   Take iron as directed by your health care provider.  If you cannot tolerate taking iron supplements by mouth, talk to your health care provider about taking them through a vein  (intravenously) or an injection into a muscle.  For the best iron absorption, iron supplements should be taken on an empty stomach. If you cannot tolerate them on an empty stomach, you may need to take them with food.  Do not drink milk or take antacids at the same time as your iron supplements. Milk and antacids may interfere with the absorption of iron.  Iron supplements can cause constipation. Make sure to include fiber in your diet to prevent constipation. A stool softener may also be recommended.  Take vitamins as directed by your health care provider.  Eat a diet rich in iron. Foods high in iron include liver, lean beef, whole-grain bread, eggs, dried fruit, and dark green leafy vegetables. SEEK IMMEDIATE MEDICAL CARE IF:   You faint. If this happens, do not drive. Call your local emergency services (911 in [place] if no other help is available.  You have chest pain.  You feel nauseous or vomit.  You have severe or increased shortness of breath with activity.  You feel weak.  You have a rapid heartbeat.  You have unexplained sweating.  You become light-headed when getting up from a chair or bed. MAKE SURE YOU:   Understand these instructions.  Will watch your condition.  Will get help right away if you are not doing well or get worse. Document Released: 09/25/2000 Document Revised: 10/03/2013 Document Reviewed: 06/05/2013 ExitCare Patient Information 2015 [employer]. This information is not intended to replace advice given to you by your health care provider. Make sure you discuss any questions you have with your health care provider.     Anemia, Nonspecific Anemia is a condition in which the concentration of red blood cells or hemoglobin in the blood is below normal. Hemoglobin is a substance in red blood cells that carries oxygen to the tissues of the body. Anemia results in not enough oxygen reaching these tissues.  CAUSES  Common causes of anemia include:    Excessive bleeding. Bleeding may be internal or external. This includes excessive bleeding from periods (in women) or from the intestine.   Poor nutrition.   Chronic kidney, thyroid, and liver disease.  Bone marrow disorders that decrease red blood cell production.  Cancer and treatments for cancer.  HIV, AIDS, and their treatments.  Spleen problems that increase red blood cell destruction.  Blood disorders.  Excess destruction of red blood cells due to infection, medicines, and autoimmune disorders. SIGNS AND SYMPTOMS   Minor weakness.   Dizziness.   Headache.  Palpitations.   Shortness of breath, especially with exercise.   Paleness.  Cold sensitivity.  Indigestion.  Nausea.  Difficulty sleeping.  Difficulty concentrating. Symptoms may occur suddenly or they may develop slowly.  DIAGNOSIS  Additional blood tests are often needed. These help your health care provider determine the best treatment. Your health care provider will check your stool for blood and look for other causes of blood loss.  TREATMENT  Treatment varies depending on the cause of the anemia. Treatment can include:   Supplements of iron, vitamin B12, or folic acid.   Hormone medicines.   A blood transfusion. This may be needed if blood loss is severe.   Hospitalization. This may be needed if there is significant continual blood loss.   Dietary changes.  Spleen removal. HOME CARE INSTRUCTIONS Keep all follow-up appointments. It often takes many weeks to correct anemia, and having your health care provider check on your condition and your response to treatment is very important. SEEK IMMEDIATE MEDICAL CARE IF:   You develop extreme weakness, shortness of breath, or chest pain.   You become dizzy or have trouble concentrating.  You develop heavy vaginal bleeding.   You develop a rash.   You have bloody or black, tarry stools.   You faint.   You vomit up blood.    You vomit repeatedly.   You have abdominal pain.  You have a fever or persistent symptoms for more than 2-3 days.   You have a fever and your symptoms suddenly get worse.   You are dehydrated.  MAKE SURE YOU:  Understand these instructions.  Will watch your condition.  Will get help right away if you are not doing well or get worse. Document Released: 11/05/2004 Document Revised: 05/31/2013 Document Reviewed: 03/24/2013 ExitCare Patient Information 2015 [employer]. This information is not intended to replace advice given to you by your health care provider. Make sure you discuss any questions you have with your health care provider.  

## 2014-11-13 NOTE — Telephone Encounter (Signed)
Klonopin 0.5mg  tablet refill called into [facility] [address]

## 2014-11-21 LAB — POC HEMOCCULT BLD/STL (HOME/3-CARD/SCREEN)
Card #2 Fecal Occult Blod, POC: NEGATIVE
Card #3 Fecal Occult Blood, POC: NEGATIVE
Fecal Occult Blood, POC: NEGATIVE

## 2014-12-21 ENCOUNTER — Encounter: Payer: Self-pay | Admitting: Family Medicine

## 2014-12-21 ENCOUNTER — Ambulatory Visit (INDEPENDENT_AMBULATORY_CARE_PROVIDER_SITE_OTHER): Payer: Commercial Managed Care - HMO | Admitting: Family Medicine

## 2014-12-21 VITALS — BP 126/80 | HR 68 | Temp 98.2°F | Resp 16 | Ht 61.5 in | Wt 232.0 lb

## 2014-12-21 DIAGNOSIS — D509 Iron deficiency anemia, unspecified: Secondary | ICD-10-CM | POA: Diagnosis not present

## 2014-12-21 DIAGNOSIS — I10 Essential (primary) hypertension: Secondary | ICD-10-CM | POA: Insufficient documentation

## 2014-12-21 LAB — CBC
HCT: 35.9 % — ABNORMAL LOW (ref 36.0–46.0)
Hemoglobin: 11.4 g/dL — ABNORMAL LOW (ref 12.0–15.0)
MCH: 25.7 pg — ABNORMAL LOW (ref 26.0–34.0)
MCHC: 31.8 g/dL (ref 30.0–36.0)
MCV: 81 fL (ref 78.0–100.0)
MPV: 9.7 fL (ref 8.6–12.4)
Platelets: 379 10*3/uL (ref 150–400)
RBC: 4.43 MIL/uL (ref 3.87–5.11)
RDW: 16.4 % — ABNORMAL HIGH (ref 11.5–15.5)
WBC: 6.6 10*3/uL (ref 4.0–10.5)

## 2014-12-21 LAB — FERRITIN: Ferritin: 11 ng/mL (ref 10–291)

## 2014-12-21 NOTE — Progress Notes (Signed)
Chief Complaint:  Chief Complaint  Patient presents with  . Follow-up  . iron levels    HPI: Nancy Hinton is a 69 y.o. female who is here for  anemia recheck. She has had anemia in the past. However last CBC showed a hemoglobin of 10.7. Has had gastric bypass. I think she has a deficiency anemia due to malabsorption from gastric bypass. She did do 3 Hemoccult stool tests for me and they were all negative for any occult bleeding. She is taking her iron supplements twice a day for the last month, she does have constipation with this. She denies any chest pain shortness of breath, palpitations, dizziness. She is constantly tired. She continues to work and [place]. Every 12 hours days. And she has a lot of home stressors. Includes a unemployed daughter who is addicted to cocaine. He has been in and out of the house for many years. She Is Financially Supporting along with the Appearance. Just Recently Told Her That She Is Expecting a Baby Girl Soon. She is having no complications with her blood pressure medicine. He is compliant with her blood pressure medicines. Hydrochlorothiazide. This helps with her leg and ankle swelling. She has significant arthritis in her knees, has trouble walking due to her arthritis. Does not want to have any surgery until she loses about 30 pounds. She has not joined any water aerobics. She is thinking of going to Silver sneakers with manner.   CBC Latest Ref Rng 11/10/2014 09/07/2014 04/07/2013  WBC 4.6 - 10.2 K/uL 5.3 5.6 6.9  Hemoglobin 12.2 - 16.2 g/dL 10.7(A) 11.1(L) 13.6  Hematocrit 37.7 - 47.9 % 34.3(A) 34.8(L) 43.7  Platelets 150 - 400 K/uL - 333 -    BP Readings from Last 3 Encounters:  12/21/14 126/80  11/13/14 132/82  11/10/14 142/78   Wt Readings from Last 3 Encounters:  12/21/14 232 lb (105.235 kg)  11/13/14 238 lb (107.956 kg)  11/10/14 239 lb 12.8 oz (108.773 kg)  \  Past Medical History  Diagnosis Date  . Arthritis   .  Hypertension   . Diabetes mellitus without complication     history of DM prior to gastric bypass, she has not had to be on medicine since 170 lb weightloss.   Past Surgical History  Procedure Laterality Date  . Tonsillectomy    . Gastric bypass    . Colon resection     History   Social History  . Marital Status: Divorced    Spouse Name: N/A  . Number of Children: N/A  . Years of Education: N/A   Social History Main Topics  . Smoking status: Never Smoker   . Smokeless tobacco: Never Used  . Alcohol Use: No  . Drug Use: No  . Sexual Activity: No   Other Topics Concern  . None   Social History Narrative   Family History  Problem Relation Age of Onset  . Alzheimer's disease Mother   . Heart disease Father   . Heart attack Maternal Grandmother    Allergies  Allergen Reactions  . Codeine Nausea Only   Prior to Admission medications   Medication Sig Start Date End Date Taking? Authorizing Provider  clonazePAM (KLONOPIN) 0.5 MG tablet TAKE 1-2 TABLET BY MOUTH TWICE DAILY AS NEEDED 11/13/14  Yes Rome Schlauch P Citlaly Camplin, DO  ferrous sulfate 325 (65 FE) MG tablet Take 1 tablet (325 mg total) by mouth 2 (two) times daily with a meal. 11/13/14  Yes Tosha Belgarde P  Laberta Wilbon, DO  hydrochlorothiazide (MICROZIDE) 12.5 MG capsule Take 1 capsule (12.5 mg total) by mouth daily. 04/27/14  Yes Rihanna Marseille P Rhaya Coale, DO  levothyroxine (SYNTHROID, LEVOTHROID) 88 MCG tablet TAKE 1 TABLET BY MOUTH EVERY DAY 07/23/14  Yes Chelle S Jeffery, PA-C  lisinopril (PRINIVIL,ZESTRIL) 20 MG tablet Take 1 tablet (20 mg total) by mouth daily. 04/27/14  Yes Briannon Boggio P Everleigh Colclasure, DO  Multiple Vitamin (MULITIVITAMIN WITH MINERALS) TABS Take 1 tablet by mouth daily.     Yes Historical Provider, MD  Vitamin D, Ergocalciferol, (DRISDOL) 50000 UNITS CAPS capsule Take 1 capsule (50,000 Units total) by mouth every 7 (seven) days. Take on Tuesday 09/07/14  Yes Amadi Frady P Totiana Everson, DO     ROS: The patient denies fevers, chills, night sweats, unintentional weight loss, chest pain,  palpitations, wheezing, dyspnea on exertion, nausea, vomiting, abdominal pain, dysuria, hematuria, melena, numbness, weakness, or tingling.   All other systems have been reviewed and were otherwise negative with the exception of those mentioned in the HPI and as above.    PHYSICAL EXAM: Filed Vitals:   12/21/14 1324  BP: 126/80  Pulse: 68  Temp: 98.2 F (36.8 C)  Resp: 16   Filed Vitals:   12/21/14 1324  Height: 5' 1.5" (1.562 m)  Weight: 232 lb (105.235 kg)   Body mass index is 43.13 kg/(m^2).  General: Alert, no acute distress. Morbidly obese female HEENT:  Normocephalic, atraumatic, oropharynx patent. EOMI, PERRLA Cardiovascular:  Regular rate and rhythm, no rubs murmurs or gallops.  No Carotid bruits, radial pulse intact.+ pedal edema.  Respiratory: Clear to auscultation bilaterally.  No wheezes, rales, or rhonchi.  No cyanosis, no use of accessory musculature GI: No organomegaly, abdomen is soft and non-tender, positive bowel sounds.  No masses. Skin: No rashes. Neurologic: Facial musculature symmetric. Psychiatric: Patient is appropriate throughout our interaction. Lymphatic: No cervical lymphadenopathy Musculoskeletal: Gait antalgic   LABS: Results for orders placed or performed in visit on 11/10/14  IBC Panel  Result Value Ref Range   UIBC 396 125 - 400 ug/dL   TIBC 811 914 - 782 ug/dL   %SAT 7 (L) 20 - 55 %  Ferritin  Result Value Ref Range   Ferritin 7 (L) 10 - 291 ng/mL  Iron  Result Value Ref Range   Iron 28 (L) 42 - 145 ug/dL  POCT CBC  Result Value Ref Range   WBC 5.3 4.6 - 10.2 K/uL   Lymph, poc 1.7 0.6 - 3.4   POC LYMPH PERCENT 32.9 10 - 50 %L   MID (cbc) 0.3 0 - 0.9   POC MID % 5.8 0 - 12 %M   POC Granulocyte 3.2 2 - 6.9   Granulocyte percent 61.3 37 - 80 %G   RBC 4.22 4.04 - 5.48 M/uL   Hemoglobin 10.7 (A) 12.2 - 16.2 g/dL   HCT, POC 95.6 (A) 21.3 - 47.9 %   MCV 81.3 80 - 97 fL   MCH, POC 25.3 (A) 27 - 31.2 pg   MCHC 31.2 (A) 31.8 - 35.4  g/dL   RDW, POC 08.6 %   Platelet Count, POC 353 142 - 424 K/uL   MPV 7.2 0 - 99.8 fL  POC Hemoccult Bld/Stl (3-Cd Home Screen)  Result Value Ref Range   Card #1 Date 11/18/14    Fecal Occult Blood, POC Negative    Card #2 Date 11/19/14    Card #2 Fecal Occult Blod, POC Negative    Card #3 Date 11/20/14  Card #3 Fecal Occult Blood, POC Negative      EKG/XRAY:   Primary read interpreted by Dr. Conley Rolls at [facility].   ASSESSMENT/PLAN: Encounter Diagnoses  Name Primary?  Marland Kitchen Anemia, iron deficiency Yes  . Essential hypertension    Repeat CBC and iron studies. She will consider orthopedic referral. However she will try Silver sneakers first few she can lose some more weight. Follow-up in 6 months otherwise when necessary. This is all pending her labs. Continue with iron. Her hemoglobin continues to be low and also iron continues to be low then I will consider hematology referral.  Gross sideeffects, risk and benefits, and alternatives of medications d/w patient. Patient is aware that all medications have potential sideeffects and we are unable to predict every sideeffect or drug-drug interaction that may occur.  Lekha Dancer PHUONG, DO 12/21/2014 1:51 PM

## 2014-12-25 LAB — COMPLETE METABOLIC PANEL WITHOUT GFR
ALT: 13 U/L (ref 0–35)
AST: 16 U/L (ref 0–37)
Albumin: 3.5 g/dL (ref 3.5–5.2)
Alkaline Phosphatase: 81 U/L (ref 39–117)
BUN: 17 mg/dL (ref 6–23)
CO2: 21 meq/L (ref 19–32)
Calcium: 8.4 mg/dL (ref 8.4–10.5)
Chloride: 106 meq/L (ref 96–112)
Creat: 0.71 mg/dL (ref 0.50–1.10)
GFR, Est African American: >89 mL/min
GFR, Est Non African American: 88 mL/min
Glucose, Bld: 108 mg/dL — ABNORMAL HIGH (ref 70–99)
Potassium: 4.4 meq/L (ref 3.5–5.3)
Sodium: 139 meq/L (ref 135–145)
Total Bilirubin: 0.5 mg/dL (ref 0.2–1.2)
Total Protein: 5.8 g/dL — ABNORMAL LOW (ref 6.0–8.3)

## 2014-12-25 LAB — IBC PANEL
%SAT: 7 % — ABNORMAL LOW (ref 20–55)
TIBC: 438 ug/dL (ref 250–470)
UIBC: 406 ug/dL — ABNORMAL HIGH (ref 125–400)

## 2014-12-25 LAB — IRON: Iron: 32 ug/dL — ABNORMAL LOW (ref 42–145)

## 2014-12-27 ENCOUNTER — Encounter: Payer: Self-pay | Admitting: Family Medicine

## 2014-12-28 ENCOUNTER — Ambulatory Visit: Payer: Medicare HMO | Admitting: Family Medicine

## 2015-01-02 ENCOUNTER — Telehealth: Payer: Self-pay

## 2015-01-02 NOTE — Telephone Encounter (Signed)
Dr, Cleta Albertsaub did you call pt?

## 2015-01-02 NOTE — Telephone Encounter (Signed)
Who is her father ??  I have not called any elderly patients today

## 2015-01-02 NOTE — Telephone Encounter (Signed)
Patient says Dr Cleta Albertsaub called her home and spoke with her elderly father who did not understand what the call was regarding. Patient states she does not know who Dr Cleta Albertsaub is and is not sure why he would have called

## 2015-01-11 ENCOUNTER — Ambulatory Visit: Payer: Medicare HMO | Admitting: Family Medicine

## 2015-01-16 ENCOUNTER — Other Ambulatory Visit: Payer: Self-pay | Admitting: Family Medicine

## 2015-01-17 ENCOUNTER — Other Ambulatory Visit: Payer: Self-pay | Admitting: Family Medicine

## 2015-01-21 NOTE — Telephone Encounter (Signed)
Called in.

## 2015-03-02 ENCOUNTER — Other Ambulatory Visit: Payer: Self-pay | Admitting: Family Medicine

## 2015-03-05 NOTE — Telephone Encounter (Signed)
Rx called in 

## 2015-03-20 ENCOUNTER — Encounter: Payer: Self-pay | Admitting: *Deleted

## 2015-04-02 ENCOUNTER — Other Ambulatory Visit: Payer: Self-pay | Admitting: Family Medicine

## 2015-04-02 NOTE — Telephone Encounter (Signed)
Pt missed her appointment, did you want to refill?

## 2015-04-05 ENCOUNTER — Other Ambulatory Visit: Payer: Self-pay | Admitting: Family Medicine

## 2015-04-15 ENCOUNTER — Other Ambulatory Visit: Payer: Self-pay | Admitting: Family Medicine

## 2015-04-19 ENCOUNTER — Other Ambulatory Visit: Payer: Self-pay | Admitting: Family Medicine

## 2015-04-25 ENCOUNTER — Other Ambulatory Visit: Payer: Self-pay

## 2015-04-25 DIAGNOSIS — I10 Essential (primary) hypertension: Secondary | ICD-10-CM

## 2015-04-25 MED ORDER — HYDROCHLOROTHIAZIDE 12.5 MG PO CAPS: 12.5000 mg | ORAL_CAPSULE | Freq: Every day | ORAL | Status: DC

## 2015-05-03 ENCOUNTER — Other Ambulatory Visit: Payer: Self-pay | Admitting: Family Medicine

## 2015-05-07 NOTE — Telephone Encounter (Signed)
Did not fax, I called in because Rx wasn't signed yet.

## 2015-05-07 NOTE — Telephone Encounter (Addendum)
Faxed

## 2015-05-14 ENCOUNTER — Other Ambulatory Visit: Payer: Self-pay | Admitting: Family Medicine

## 2015-06-06 ENCOUNTER — Other Ambulatory Visit: Payer: Self-pay | Admitting: Family Medicine

## 2015-06-16 ENCOUNTER — Other Ambulatory Visit: Payer: Self-pay | Admitting: Family Medicine

## 2015-07-13 ENCOUNTER — Other Ambulatory Visit: Payer: Self-pay | Admitting: Family Medicine

## 2015-07-30 ENCOUNTER — Other Ambulatory Visit: Payer: Self-pay | Admitting: Family Medicine

## 2015-08-12 ENCOUNTER — Other Ambulatory Visit: Payer: Self-pay | Admitting: Physician Assistant

## 2015-09-17 ENCOUNTER — Other Ambulatory Visit: Payer: Self-pay | Admitting: Physician Assistant

## 2015-10-03 ENCOUNTER — Ambulatory Visit (INDEPENDENT_AMBULATORY_CARE_PROVIDER_SITE_OTHER): Payer: Commercial Managed Care - HMO | Admitting: Family Medicine

## 2015-10-03 VITALS — BP 125/70 | HR 82 | Temp 98.1°F | Resp 16 | Ht 61.5 in | Wt 223.0 lb

## 2015-10-03 DIAGNOSIS — E559 Vitamin D deficiency, unspecified: Secondary | ICD-10-CM

## 2015-10-03 DIAGNOSIS — D509 Iron deficiency anemia, unspecified: Secondary | ICD-10-CM

## 2015-10-03 DIAGNOSIS — M25579 Pain in unspecified ankle and joints of unspecified foot: Secondary | ICD-10-CM

## 2015-10-03 DIAGNOSIS — M8580 Other specified disorders of bone density and structure, unspecified site: Secondary | ICD-10-CM | POA: Diagnosis not present

## 2015-10-03 DIAGNOSIS — I10 Essential (primary) hypertension: Secondary | ICD-10-CM | POA: Diagnosis not present

## 2015-10-03 DIAGNOSIS — E039 Hypothyroidism, unspecified: Secondary | ICD-10-CM

## 2015-10-03 DIAGNOSIS — Z9889 Other specified postprocedural states: Secondary | ICD-10-CM | POA: Diagnosis not present

## 2015-10-03 DIAGNOSIS — Z638 Other specified problems related to primary support group: Secondary | ICD-10-CM

## 2015-10-03 DIAGNOSIS — F439 Reaction to severe stress, unspecified: Secondary | ICD-10-CM

## 2015-10-03 DIAGNOSIS — Z9884 Bariatric surgery status: Secondary | ICD-10-CM | POA: Diagnosis not present

## 2015-10-03 DIAGNOSIS — M1990 Unspecified osteoarthritis, unspecified site: Secondary | ICD-10-CM | POA: Diagnosis not present

## 2015-10-03 DIAGNOSIS — M129 Arthropathy, unspecified: Secondary | ICD-10-CM | POA: Diagnosis not present

## 2015-10-03 LAB — POCT SEDIMENTATION RATE: POCT SED RATE: 25 mm/h — AB (ref 0–22)

## 2015-10-03 MED ORDER — DULOXETINE HCL 30 MG PO CPEP: 30.0000 mg | ORAL_CAPSULE | Freq: Every day | ORAL | Status: DC

## 2015-10-03 MED ORDER — HYDROCHLOROTHIAZIDE 12.5 MG PO CAPS: ORAL_CAPSULE | ORAL | Status: DC

## 2015-10-03 MED ORDER — CLONAZEPAM 1 MG PO TABS: 1.0000 mg | ORAL_TABLET | Freq: Every evening | ORAL | Status: DC | PRN

## 2015-10-03 MED ORDER — VITAMIN D (ERGOCALCIFEROL) 1.25 MG (50000 UNIT) PO CAPS: ORAL_CAPSULE | ORAL | Status: DC

## 2015-10-03 MED ORDER — LISINOPRIL 20 MG PO TABS: 20.0000 mg | ORAL_TABLET | Freq: Every day | ORAL | Status: DC

## 2015-10-03 MED ORDER — LEVOTHYROXINE SODIUM 88 MCG PO TABS: 88.0000 ug | ORAL_TABLET | Freq: Every day | ORAL | Status: DC

## 2015-10-03 NOTE — Progress Notes (Signed)
Chief Complaint:  Chief Complaint  Patient presents with  . Medication Refill    lisinopril, hydrochlorothiazide, Klonopin, Drisdol, Levothyroxine,     HPI: Nancy Hinton is a 69 y.o. female who [facility] complaining of medication refills.  1. HTN-compliant, taking medications without SEs 2. Anxiety-Klonopin for 3 hours and is great fro 2-3 hours then will wake up. She is on 0.5 mg nightly only 3. Hypothyroid-compliant, No SEs, no worsening depression/hypothyroid sxs.   Daughter is still at home, spirals out of control with addition issues but when she is with it she helps out with her GF who has alzheimers Father is demented and belligerent Grandkids are 246 yo and 8 months She is doing better at work and will be starting to work more at home and does not have to commute to [place] as much   [employer] Readings from Last 3 Encounters:  10/03/15 223 lb (101.152 kg)  12/21/14 232 lb (105.235 kg)  11/13/14 238 lb (107.956 kg)   BP Readings from Last 3 Encounters:  10/03/15 125/70  12/21/14 126/80  11/13/14 132/82   Lab Results  Component Value Date   TSH 1.732 09/07/2014     Past Medical History  Diagnosis Date  . Arthritis   . Hypertension   . Diabetes mellitus without complication (HCC)     history of DM prior to gastric bypass, she has not had to be on medicine since 170 lb weightloss.   Past Surgical History  Procedure Laterality Date  . Tonsillectomy    . Gastric bypass    . Colon resection     Social History   Social History  . Marital Status: Divorced    Spouse Name: N/A  . Number of Children: N/A  . Years of Education: N/A   Social History Main Topics  . Smoking status: Never Smoker   . Smokeless tobacco: Never Used  . Alcohol Use: No  . Drug Use: No  . Sexual Activity: No   Other Topics Concern  . None   Social History Narrative   Family History  Problem Relation Age of Onset  . Alzheimer's disease Mother   . Heart  disease Father   . Heart attack Maternal Grandmother    Allergies  Allergen Reactions  . Codeine Nausea Only   Prior to Admission medications   Medication Sig Start Date End Date Taking? Authorizing Provider  clonazePAM (KLONOPIN) 0.5 MG tablet TAKE 1 TO 2 TABLET BY MOUTH TWICE DAILY AS NEEDED 08/01/15  Yes Itzy Adler P Pakou Rainbow, DO  ferrous sulfate 325 (65 FE) MG tablet TAKE 1 TABLET BY MOUTH TWICE DAILY WITH A MEAL 04/02/15  Yes Pearlina Friedly P Lariza Cothron, DO  hydrochlorothiazide (MICROZIDE) 12.5 MG capsule TAKE 1 CAPSULE (12.5 MG) BY MOUTH DAILY.  "OV NEEDED FOR REFILLS" 07/14/15  Yes Chelle Jeffery, PA-C  levothyroxine (SYNTHROID, LEVOTHROID) 88 MCG tablet Take 1 tablet (88 mcg total) by mouth daily before breakfast. NO MORE REFILLS WITHOUT OFFICE VISIT/LABS - 2ND NOTICE 09/18/15  Yes Lanier ClamNicole Bush V, PA-C  lisinopril (PRINIVIL,ZESTRIL) 20 MG tablet Take 1 tablet (20 mg total) by mouth daily. NO MORE REFILLS WITHOUT OFFICE VISIT - 2ND NOTICE 09/18/15  Yes Lanier ClamNicole Bush V, PA-C  Multiple Vitamin (MULITIVITAMIN WITH MINERALS) TABS Take 1 tablet by mouth daily.     Yes Historical Provider, MD  Vitamin D, Ergocalciferol, (DRISDOL) 50000 UNITS CAPS capsule TAKE 1 CAPSULE BY MOUTH EVERY 7 DAYS ON TUES 05/15/15  Yes Sarah L  Weber, PA-C     ROS: The patient denies fevers, chills, night sweats, unintentional weight loss, chest pain, palpitations, wheezing, dyspnea on exertion, nausea, vomiting, abdominal pain, dysuria, hematuria, melena, numbness, weakness, or tingling.   All other systems have been reviewed and were otherwise negative with the exception of those mentioned in the HPI and as above.    PHYSICAL EXAM: Filed Vitals:   10/03/15 1611  BP: 125/70  Pulse: 82  Temp: 98.1 F (36.7 C)  Resp: 16   Body mass index is 41.46 kg/(m^2).   General: Alert, no acute distress HEENT:  Normocephalic, atraumatic, oropharynx patent. EOMI, PERRLA TM nomrla , no exudates, fundo exam normal Cardiovascular:  Regular rate and rhythm,  no rubs murmurs or gallops.  No Carotid bruits, radial pulse intact. + chronic pedal edema.  Respiratory: Clear to auscultation bilaterally.  No wheezes, rales, or rhonchi.  No cyanosis, no use of accessory musculature Abdominal: No organomegaly, abdomen is soft and non-tender, positive bowel sounds. No masses. Skin: No rashes. Neurologic: Facial musculature symmetric. Psychiatric: Patient acts appropriately throughout our interaction. Lymphatic: No cervical or submandibular lymphadenopathy Musculoskeletal: Gait intact.   LABS: Results for orders placed or performed in visit on 12/21/14  CBC  Result Value Ref Range   WBC 6.6 4.0 - 10.5 K/uL   RBC 4.43 3.87 - 5.11 MIL/uL   Hemoglobin 11.4 (L) 12.0 - 15.0 g/dL   HCT 16.1 (L) 09.6 - 04.5 %   MCV 81.0 78.0 - 100.0 fL   MCH 25.7 (L) 26.0 - 34.0 pg   MCHC 31.8 30.0 - 36.0 g/dL   RDW 40.9 (H) 81.1 - 91.4 %   Platelets 379 150 - 400 K/uL   MPV 9.7 8.6 - 12.4 fL  Ferritin  Result Value Ref Range   Ferritin 11 10 - 291 ng/mL  IBC Panel  Result Value Ref Range   UIBC 406 (H) 125 - 400 ug/dL   TIBC 782 956 - 213 ug/dL   %SAT 7 (L) 20 - 55 %  COMPLETE METABOLIC PANEL WITH GFR  Result Value Ref Range   Sodium 139 135 - 145 mEq/L   Potassium 4.4 3.5 - 5.3 mEq/L   Chloride 106 96 - 112 mEq/L   CO2 21 19 - 32 mEq/L   Glucose, Bld 108 (H) 70 - 99 mg/dL   BUN 17 6 - 23 mg/dL   Creat 0.86 5.78 - 4.69 mg/dL   Total Bilirubin 0.5 0.2 - 1.2 mg/dL   Alkaline Phosphatase 81 39 - 117 U/L   AST 16 0 - 37 U/L   ALT 13 0 - 35 U/L   Total Protein 5.8 (L) 6.0 - 8.3 g/dL   Albumin 3.5 3.5 - 5.2 g/dL   Calcium 8.4 8.4 - 62.9 mg/dL   GFR, Est African American >89 mL/min   GFR, Est Non African American 88 mL/min  Iron  Result Value Ref Range   Iron 32 (L) 42 - 145 ug/dL     EKG/XRAY:   Primary read interpreted by Dr. Conley Rolls at [facility].   ASSESSMENT/PLAN: Encounter Diagnoses  Name Primary?  Marland Kitchen Anemia, iron deficiency Yes  . Essential hypertension    . Stress at home   . Vitamin D deficiency   . Hypothyroidism, unspecified hypothyroidism type   . Arthritis   . Pain in joint, ankle and foot, unspecified laterality   . Osteopenia   . H/O gastric bypass    Labs pending, refilled chronic meds Patient decline flu vaccine  She will return to get annual labs and screening done with me Increase klonopin from 0.5 mg to 1 gram to help with sleep and anxiety qhs prn  Rx Cymbalta to see if help with joint pain and also depression sxs. May need prior auth, she was on lexapro at one point without releif, I feel she may be having chronic pain /fibro sxs in addition to arhtirits and depression She denies SI or HI Fu prn otherwise in 3 months for annual visit.   Gross sideeffects, risk and benefits, and alternatives of medications d/w patient. Patient is aware that all medications have potential sideeffects and we are unable to predict every sideeffect or drug-drug interaction that may occur.  Oswald Pott DO  10/03/2015 5:23 PM

## 2015-10-03 NOTE — Patient Instructions (Signed)
Duloxetine delayed-release capsules  What is this medicine?  DULOXETINE (doo LOX e teen) is used to treat depression, anxiety, and different types of chronic pain.  This medicine may be used for other purposes; ask your health care provider or pharmacist if you have questions.  What should I tell my health care provider before I take this medicine?  They need to know if you have any of these conditions:  -bipolar disorder or a family history of bipolar disorder  -glaucoma  -kidney disease  -liver disease  -suicidal thoughts or a previous suicide attempt  -taken medicines called MAOIs like Carbex, Eldepryl, Marplan, Nardil, and Parnate within 14 days  -an unusual reaction to duloxetine, other medicines, foods, dyes, or preservatives  -pregnant or trying to get pregnant  -breast-feeding  How should I use this medicine?  Take this medicine by mouth with a glass of water. Follow the directions on the prescription label. Do not cut, crush or chew this medicine. You can take this medicine with or without food. Take your medicine at regular intervals. Do not take your medicine more often than directed. Do not stop taking this medicine suddenly except upon the advice of your doctor. Stopping this medicine too quickly may cause serious side effects or your condition may worsen.  A special MedGuide will be given to you by the pharmacist with each prescription and refill. Be sure to read this information carefully each time.  Talk to your pediatrician regarding the use of this medicine in children. While this drug may be prescribed for children as young as 7 years of age for selected conditions, precautions do apply.  Overdosage: If you think you have taken too much of this medicine contact a poison control center or emergency room at once.  NOTE: This medicine is only for you. Do not share this medicine with others.  What if I miss a dose?  If you miss a dose, take it as soon as you can. If it is almost time for your next  dose, take only that dose. Do not take double or extra doses.  What may interact with this medicine?  Do not take this medicine with any of the following medications:  -certain diet drugs like dexfenfluramine, fenfluramine  -desvenlafaxine  -linezolid  -MAOIs like Azilect, Carbex, Eldepryl, Marplan, Nardil, and Parnate  -methylene blue (intravenous)  -milnacipran  -thioridazine  -venlafaxine  This medicine may also interact with the following medications:  -alcohol  -aspirin and aspirin-like medicines  -certain antibiotics like ciprofloxacin and enoxacin  -certain medicines for blood pressure, heart disease, irregular heart beat  -certain medicines for depression, anxiety, or psychotic disturbances  -certain medicines for migraine headache like almotriptan, eletriptan, frovatriptan, naratriptan, rizatriptan, sumatriptan, zolmitriptan  -certain medicines that treat or prevent blood clots like warfarin, enoxaparin, and dalteparin  -cimetidine  -fentanyl  -lithium  -NSAIDS, medicines for pain and inflammation, like ibuprofen or naproxen  -phentermine  -procarbazine  -sibutramine  -St. John's wort  -theophylline  -tramadol  -tryptophan  This list may not describe all possible interactions. Give your health care provider a list of all the medicines, herbs, non-prescription drugs, or dietary supplements you use. Also tell them if you smoke, drink alcohol, or use illegal drugs. Some items may interact with your medicine.  What should I watch for while using this medicine?  Tell your doctor if your symptoms do not get better or if they get worse. Visit your doctor or health care professional for regular checks on your progress.   Because it may take several weeks to see the full effects of this medicine, it is important to continue your treatment as prescribed by your doctor.  Patients and their families should watch out for new or worsening thoughts of suicide or depression. Also watch out for sudden changes in feelings such  as feeling anxious, agitated, panicky, irritable, hostile, aggressive, impulsive, severely restless, overly excited and hyperactive, or not being able to sleep. If this happens, especially at the beginning of treatment or after a change in dose, call your health care professional.  You may get drowsy or dizzy. Do not drive, use machinery, or do anything that needs mental alertness until you know how this medicine affects you. Do not stand or sit up quickly, especially if you are an older patient. This reduces the risk of dizzy or fainting spells. Alcohol may interfere with the effect of this medicine. Avoid alcoholic drinks.  This medicine can cause an increase in blood pressure. This medicine can also cause a sudden drop in your blood pressure, which may make you feel faint and increase the chance of a fall. These effects are most common when you first start the medicine or when the dose is increased, or during use of other medicines that can cause a sudden drop in blood pressure. Check with your doctor for instructions on monitoring your blood pressure while taking this medicine.  Your mouth may get dry. Chewing sugarless gum or sucking hard candy, and drinking plenty of water may help. Contact your doctor if the problem does not go away or is severe.  What side effects may I notice from receiving this medicine?  Side effects that you should report to your doctor or health care professional as soon as possible:  -allergic reactions like skin rash, itching or hives, swelling of the face, lips, or tongue  -changes in blood pressure  -confusion  -dark urine  -dizziness  -fast talking and excited feelings or actions that are out of control  -fast, irregular heartbeat  -fever  -general ill feeling or flu-like symptoms  -hallucination, loss of contact with reality  -light-colored stools  -loss of balance or coordination  -redness, blistering, peeling or loosening of the skin, including inside the mouth  -right upper  belly pain  -seizures  -suicidal thoughts or other mood changes  -trouble concentrating  -trouble passing urine or change in the amount of urine  -unusual bleeding or bruising  -unusually weak or tired  -yellowing of the eyes or skin  Side effects that usually do not require medical attention (report to your doctor or health care professional if they continue or are bothersome):  -blurred vision  -change in appetite  -change in sex drive or performance  -headache  -increased sweating  -nausea  This list may not describe all possible side effects. Call your doctor for medical advice about side effects. You may report side effects to FDA at 1-800-FDA-1088.  Where should I keep my medicine?  Keep out of the reach of children.  Store at room temperature between 20 and 25 degrees C (68 to 77 degrees F). Throw away any unused medicine after the expiration date.  NOTE: This sheet is a summary. It may not cover all possible information. If you have questions about this medicine, talk to your doctor, pharmacist, or health care provider.     © 2016, Elsevier/Gold Standard. (2013-09-19 16:28:32)

## 2015-10-04 LAB — CBC WITH DIFFERENTIAL/PLATELET
Basophils Absolute: 0.1 10*3/uL (ref 0.0–0.1)
Basophils Relative: 1 % (ref 0–1)
Eosinophils Absolute: 0.1 10*3/uL (ref 0.0–0.7)
Eosinophils Relative: 2 % (ref 0–5)
HCT: 38.5 % (ref 36.0–46.0)
Hemoglobin: 12.5 g/dL (ref 12.0–15.0)
Lymphocytes Relative: 40 % (ref 12–46)
Lymphs Abs: 2.6 10*3/uL (ref 0.7–4.0)
MCH: 28 pg (ref 26.0–34.0)
MCHC: 32.5 g/dL (ref 30.0–36.0)
MCV: 86.1 fL (ref 78.0–100.0)
MPV: 9.8 fL (ref 8.6–12.4)
Monocytes Absolute: 0.4 10*3/uL (ref 0.1–1.0)
Monocytes Relative: 6 % (ref 3–12)
Neutro Abs: 3.3 10*3/uL (ref 1.7–7.7)
Neutrophils Relative %: 51 % (ref 43–77)
Platelets: 336 10*3/uL (ref 150–400)
RBC: 4.47 MIL/uL (ref 3.87–5.11)
RDW: 13.8 % (ref 11.5–15.5)
WBC: 6.5 10*3/uL (ref 4.0–10.5)

## 2015-10-04 LAB — RHEUMATOID FACTOR: Rheumatoid fact SerPl-aCnc: <10 [IU]/mL (ref <=14)

## 2015-10-04 LAB — COMPLETE METABOLIC PANEL WITHOUT GFR
ALT: 18 U/L (ref 6–29)
CO2: 26 mmol/L (ref 20–31)
Calcium: 8.8 mg/dL (ref 8.6–10.4)
Creat: 0.86 mg/dL (ref 0.50–0.99)
GFR, Est African American: 80 mL/min (ref >=60)
Total Bilirubin: 0.6 mg/dL (ref 0.2–1.2)
Total Protein: 6.7 g/dL (ref 6.1–8.1)

## 2015-10-04 LAB — TSH: TSH: 1.596 u[IU]/mL (ref 0.350–4.500)

## 2015-10-04 LAB — COMPLETE METABOLIC PANEL WITH GFR
AST: 19 U/L (ref 10–35)
Albumin: 4 g/dL (ref 3.6–5.1)
Alkaline Phosphatase: 83 U/L (ref 33–130)
BUN: 15 mg/dL (ref 7–25)
Chloride: 102 mmol/L (ref 98–110)
GFR, Est Non African American: 70 mL/min (ref >=60)
Glucose, Bld: 162 mg/dL — ABNORMAL HIGH (ref 65–99)
Potassium: 4.3 mmol/L (ref 3.5–5.3)
Sodium: 137 mmol/L (ref 135–146)

## 2015-10-04 LAB — IRON: Iron: 52 ug/dL (ref 45–160)

## 2015-10-04 LAB — FOLATE: Folate: 7.8 ng/mL

## 2015-10-04 LAB — VITAMIN B12: Vitamin B-12: 297 pg/mL (ref 211–911)

## 2015-10-05 LAB — VITAMIN D 25 HYDROXY (VIT D DEFICIENCY, FRACTURES): Vit D, 25-Hydroxy: 37 ng/mL (ref 30–100)

## 2015-10-07 ENCOUNTER — Other Ambulatory Visit: Payer: Self-pay

## 2015-10-07 DIAGNOSIS — E2839 Other primary ovarian failure: Secondary | ICD-10-CM

## 2015-10-08 ENCOUNTER — Telehealth: Payer: Self-pay

## 2015-10-08 ENCOUNTER — Other Ambulatory Visit: Payer: Self-pay | Admitting: Physician Assistant

## 2015-10-08 DIAGNOSIS — E559 Vitamin D deficiency, unspecified: Secondary | ICD-10-CM | POA: Insufficient documentation

## 2015-10-08 DIAGNOSIS — M8580 Other specified disorders of bone density and structure, unspecified site: Secondary | ICD-10-CM | POA: Insufficient documentation

## 2015-10-08 DIAGNOSIS — E039 Hypothyroidism, unspecified: Secondary | ICD-10-CM | POA: Insufficient documentation

## 2015-10-08 DIAGNOSIS — M1990 Unspecified osteoarthritis, unspecified site: Secondary | ICD-10-CM | POA: Insufficient documentation

## 2015-10-08 DIAGNOSIS — Z9884 Bariatric surgery status: Secondary | ICD-10-CM | POA: Insufficient documentation

## 2015-10-08 LAB — ANA: Anti Nuclear Antibody(ANA): NEGATIVE

## 2015-10-08 NOTE — Telephone Encounter (Signed)
Pt called requesting lab results. Please review labs.  Thank you.  

## 2015-10-09 NOTE — Telephone Encounter (Signed)
Spoke with patientabout labs, take supplemental B 12 and see how she feels since low normal. Start cymbalta and we will recheck.

## 2015-10-11 ENCOUNTER — Other Ambulatory Visit: Payer: Self-pay | Admitting: Physician Assistant

## 2015-12-06 ENCOUNTER — Ambulatory Visit
Admission: RE | Admit: 2015-12-06 | Discharge: 2015-12-06 | Disposition: A | Discharge status: Home / Self Care | Payer: Commercial Managed Care - HMO | Source: Ambulatory Visit | Attending: Family Medicine | Admitting: Family Medicine

## 2015-12-06 DIAGNOSIS — E2839 Other primary ovarian failure: Secondary | ICD-10-CM

## 2015-12-06 DIAGNOSIS — M85852 Other specified disorders of bone density and structure, left thigh: Secondary | ICD-10-CM | POA: Diagnosis not present

## 2015-12-11 ENCOUNTER — Telehealth: Payer: Self-pay

## 2015-12-11 NOTE — Telephone Encounter (Signed)
Please see previous message

## 2015-12-11 NOTE — Telephone Encounter (Signed)
Patient would like the results from her bone density from [facility].  [phone]

## 2015-12-12 NOTE — Telephone Encounter (Signed)
Left message for patient to call back for her results.

## 2015-12-12 NOTE — Telephone Encounter (Signed)
Bone density scan shows she has osteoporosis. Osteoporosis should be treated and she should return to discuss treatment options. At the very least, she should make sure she is taking [phone] units of vit D and 1200 mg calcium per day.

## 2015-12-16 NOTE — Telephone Encounter (Signed)
Left message for pt to call back  °

## 2015-12-16 NOTE — Telephone Encounter (Signed)
Spoke with pt, advised results. Pt will come in to see Joni Reiningicole on Friday.

## 2015-12-20 ENCOUNTER — Ambulatory Visit (INDEPENDENT_AMBULATORY_CARE_PROVIDER_SITE_OTHER): Payer: Commercial Managed Care - HMO | Admitting: Physician Assistant

## 2015-12-20 VITALS — BP 130/72 | HR 60 | Temp 98.9°F | Ht 61.5 in | Wt 231.0 lb

## 2015-12-20 DIAGNOSIS — M1990 Unspecified osteoarthritis, unspecified site: Secondary | ICD-10-CM | POA: Diagnosis not present

## 2015-12-20 DIAGNOSIS — M810 Age-related osteoporosis without current pathological fracture: Secondary | ICD-10-CM

## 2015-12-20 DIAGNOSIS — F329 Major depressive disorder, single episode, unspecified: Secondary | ICD-10-CM

## 2015-12-20 DIAGNOSIS — F32A Depression, unspecified: Secondary | ICD-10-CM

## 2015-12-20 MED ORDER — ALENDRONATE SODIUM 70 MG PO TABS: 70.0000 mg | ORAL_TABLET | ORAL | Status: DC

## 2015-12-20 NOTE — Progress Notes (Signed)
[facility] [address], [place] [address] [phone]  Date:  12/20/2015   Name:  Nancy Hinton   DOB:  10/03/1946   MRN:  [id number]  PCP:  Rockne CoonsLE, THAO PHUONG, DO    Chief Complaint: Follow-up and Depression   History of Present Illness:  This is a 70 y.o. female with PMH Vit D def, hypothyroidism, HTN, anemia, who is presenting for follow up bone density results. She is a regular patient of Dr. Irwin BrakemanLe's. Had bone density on 12/06/15. Showed osteoporosis with T-score -3.0. She has been diagnosed with osteopenia in the past and takes vit D 50,000 units once a day. Does not take calcium. No recent fractures. She does not exercise regularly.  Just started on day 35 of cymbalta. Was started by Dr. Conley RollsLe for depression and joint pains. She is noticing a mild improvement in her mood. Has not noticed any change in her joint pains. She states her mood is related to her home situation. Lives with her daughter and son-in-law and two grandchildren. Daughter has addiction problems and multiple suicide attempts. Father also lives with her -- he has dementia and is becoming more and more unable to perform his ADLs. She does try to get some time to herself. She will close her bedroom door and read or watch tv. On the weekends she will go shopping alone, which she enjoys.  Works as a Human resources officersales representative. Has to drive an hour to work and back.  Review of Systems:  Review of Systems See HPI  Patient Active Problem List   Diagnosis Date Noted  . Vitamin D deficiency 10/08/2015  . Thyroid activity decreased 10/08/2015  . Arthritis 10/08/2015  . Osteopenia 10/08/2015  . H/O gastric bypass 10/08/2015  . Anemia, iron deficiency 12/21/2014  . Essential hypertension 12/21/2014    Prior to Admission medications   Medication Sig Start Date End Date Taking? Authorizing Provider  clonazePAM (KLONOPIN) 1 MG tablet Take 1 tablet (1 mg total) by mouth at bedtime as needed for  anxiety. 10/03/15  Yes Thao P Le, DO  DULoxetine (CYMBALTA) 30 MG capsule Take 1 capsule (30 mg total) by mouth daily. 10/03/15  Yes Thao P Le, DO  hydrochlorothiazide (MICROZIDE) 12.5 MG capsule TAKE 1 CAPSULE (12.5 MG) BY Mouth DAily 10/03/15  Yes Thao P Le, DO  levothyroxine (SYNTHROID, LEVOTHROID) 88 MCG tablet Take 1 tablet (88 mcg total) by mouth daily before breakfast. 10/03/15  Yes Thao P Le, DO  lisinopril (PRINIVIL,ZESTRIL) 20 MG tablet Take 1 tablet (20 mg total) by mouth daily. 10/03/15  Yes Thao P Le, DO  Vitamin D, Ergocalciferol, (DRISDOL) 50000 UNITS CAPS capsule TAKE 1 CAPSULE BY MOUTH EVERY 7 DAYS ON TUES 10/03/15  Yes Thao P Le, DO  ferrous sulfate 325 (65 FE) MG tablet TAKE 1 TABLET BY MOUTH TWICE DAILY WITH A MEAL 04/02/15   Thao P Le, DO  Multiple Vitamin (MULITIVITAMIN WITH MINERALS) TABS Take 1 tablet by mouth daily. Reported on 12/20/2015    Historical Provider, MD    Allergies  Allergen Reactions  . Codeine Nausea Only    Past Surgical History  Procedure Laterality Date  . Tonsillectomy    . Gastric bypass    . Colon resection      Social History  Substance Use Topics  . Smoking status: Never Smoker   . Smokeless tobacco: Never Used  . Alcohol Use: No    Family History  Problem Relation Age of Onset  .  Alzheimer's disease Mother   . Heart disease Father   . Heart attack Maternal Grandmother     Medication list has been reviewed and updated.  Physical Examination:  Physical Exam  Constitutional: She is oriented to person, place, and time. She appears well-developed and well-nourished. No distress.  HENT:  Head: Normocephalic and atraumatic.  Right Ear: Hearing normal.  Left Ear: Hearing normal.  Nose: Nose normal.  Eyes: Conjunctivae and lids are normal. Right eye exhibits no discharge. Left eye exhibits no discharge. No scleral icterus.  Pulmonary/Chest: Effort normal. No respiratory distress.  Musculoskeletal: Normal range of motion.   Neurological: She is alert and oriented to person, place, and time.  Skin: Skin is warm, dry and intact. No lesion and no rash noted.  Psychiatric: She has a normal mood and affect. Her speech is normal and behavior is normal. Thought content normal.    BP 130/72 mmHg  Pulse 60  Temp(Src) 98.9 F (37.2 C) (Oral)  Ht 5' 1.5" (1.562 m)  Wt 231 lb (104.781 kg)  BMI 42.95 kg/m2  SpO2 98%  Assessment and Plan:  1. Osteoporosis Discussed treatment. She will stay on vit D 50,000 units once a week. Add calcium 1200 mg QD. Start on fosamax weekly. Discussed proper administration and potential side effects. Counseled on need to exercise regularly, suggested water aerobics. - alendronate (FOSAMAX) 70 MG tablet; Take 1 tablet (70 mg total) by mouth every 7 (seven) days. Take with a full glass of water on an empty stomach.  Dispense: 4 tablet; Refill: 11  2. Depression 3. Arthritis Stay on cymbalta. Return in 4 weeks for follow up to assess effectiveness.   Roswell Miners Dyke Brackett, MHS [facility]  12/20/2015

## 2015-12-20 NOTE — Patient Instructions (Addendum)
IF you received an x-ray today, you will receive an invoice from [facility]. Please contact [facility] at [phone] with questions or concerns regarding your invoice.   IF you received labwork today, you will receive an invoice from [facility]. Please contact [facility] at [phone] with questions or concerns regarding your invoice.   Our billing staff will not be able to assist you with questions regarding bills from these companies.  You will be contacted with the lab results as soon as they are available. The fastest way to get your results is to activate your My Chart account. Instructions are located on the last page of this paperwork. If you have not heard from us regarding the results in 2 weeks, please contact this office.   Continue cymbalta. Return in 4 weeks for follow up. Join the [employer]!!! Continue vit D 50,000 once a week. Take calcium 1200 mg each day. Take fosamax once a week on sundays typically. Take upright and stay upright for next 30 min to 1 hour. Take with full glass of water. May eat 30 minutes after taking.

## 2015-12-25 ENCOUNTER — Encounter: Payer: Self-pay | Admitting: Family Medicine

## 2016-01-17 DIAGNOSIS — M810 Age-related osteoporosis without current pathological fracture: Secondary | ICD-10-CM | POA: Diagnosis not present

## 2016-01-17 DIAGNOSIS — M19011 Primary osteoarthritis, right shoulder: Secondary | ICD-10-CM | POA: Diagnosis not present

## 2016-01-17 DIAGNOSIS — M19012 Primary osteoarthritis, left shoulder: Secondary | ICD-10-CM | POA: Diagnosis not present

## 2016-01-17 DIAGNOSIS — E559 Vitamin D deficiency, unspecified: Secondary | ICD-10-CM | POA: Diagnosis not present

## 2016-01-19 ENCOUNTER — Other Ambulatory Visit: Payer: Self-pay | Admitting: Physician Assistant

## 2016-03-18 ENCOUNTER — Other Ambulatory Visit: Payer: Self-pay | Admitting: Family Medicine

## 2016-04-09 ENCOUNTER — Other Ambulatory Visit: Payer: Self-pay | Admitting: Family Medicine

## 2016-04-11 NOTE — Telephone Encounter (Signed)
Unable to leave message voice mail full.  Patient needs a follow up for refills

## 2016-04-13 ENCOUNTER — Ambulatory Visit (INDEPENDENT_AMBULATORY_CARE_PROVIDER_SITE_OTHER): Payer: Commercial Managed Care - HMO | Admitting: Family Medicine

## 2016-04-13 VITALS — BP 124/70 | HR 69 | Temp 98.5°F | Resp 18 | Ht 61.5 in | Wt 228.2 lb

## 2016-04-13 DIAGNOSIS — E559 Vitamin D deficiency, unspecified: Secondary | ICD-10-CM | POA: Diagnosis not present

## 2016-04-13 DIAGNOSIS — M810 Age-related osteoporosis without current pathological fracture: Secondary | ICD-10-CM

## 2016-04-13 DIAGNOSIS — F4323 Adjustment disorder with mixed anxiety and depressed mood: Secondary | ICD-10-CM

## 2016-04-13 MED ORDER — CLONAZEPAM 1 MG PO TABS: 1.0000 mg | ORAL_TABLET | Freq: Every evening | ORAL | Status: DC | PRN

## 2016-04-13 MED ORDER — DULOXETINE HCL 30 MG PO CPEP: 30.0000 mg | ORAL_CAPSULE | Freq: Every day | ORAL | Status: DC

## 2016-04-13 NOTE — Progress Notes (Signed)
Subjective:    Patient ID: Nancy Hinton, female    DOB: 08/05/1946, 70 y.o.   MRN: [id number] By signing my name below, I, Javier Dockerobert Ryan Halas, attest that this documentation has been prepared under the direction and in the presence of Norberto SorensonEva Azarel Banner, MD. Electronically Signed: Javier Dockerobert Ryan Halas, ER Scribe. 04/13/2016. 10:27 AM.  Chief Complaint  Patient presents with  . Establish Care    HPI HPI Comments: Nancy Hinton is a 70 y.o. female [facility]. She is not taking fossamax. She has had a past appointment with Dr. Cleophas DunkerBassett, [facility]surgical [facility] who discontinued fossamax and started her on calcium and B12. The copay on alternative intervenous osteopetrosis treatments are 500-750 per treatment and she has elected not to start any of theses medications at this point. She has an uncle who had severe osteoporosis. She is taking 1200 calcium per day. She is taking 50,000 units vitamin D once per week, every Tuesday. She states she has reflux from a few specific foods, which she avoids. She is not currently doing any exercise. She had a bone density test in April. She is taking cymbalta. She takes clonazepam as needed. Her father, son in law, and daughter live with her. Her daughter is bipolar, has schizoaffective disorder and is manic depressive. Her daughter has been on suicide watch twice in the past six weeks. She has been off her iron supplement for more than a year. After starting her vitamin B12 she felt more energetic.    Past Medical History  Diagnosis Date  . Arthritis   . Hypertension   . Diabetes mellitus without complication (HCC)     history of DM prior to gastric bypass, she has not had to be on medicine since 170 lb weightloss.   Allergies  Allergen Reactions  . Codeine Nausea Only   Current Outpatient Prescriptions on File Prior to Visit  Medication Sig Dispense Refill  . hydrochlorothiazide (MICROZIDE) 12.5  MG capsule TAKE 1 CAPSULE (12.5 MG) BY Mouth DAily 90 capsule 3  . levothyroxine (SYNTHROID, LEVOTHROID) 88 MCG tablet Take 1 tablet (88 mcg total) by mouth daily before breakfast. 90 tablet 3  . lisinopril (PRINIVIL,ZESTRIL) 20 MG tablet Take 1 tablet (20 mg total) by mouth daily. 90 tablet 3  . Multiple Vitamin (MULITIVITAMIN WITH MINERALS) TABS Take 1 tablet by mouth daily. Reported on 12/20/2015    . Vitamin D, Ergocalciferol, (DRISDOL) 50000 units CAPS capsule TAKE 1 CAPSULE BY MOUTH EVERY TUESDAY 12 capsule 2   No current facility-administered medications on file prior to visit.   Depression screen [facility] 04/13/2016 12/20/2015 10/03/2015 09/07/2014 04/27/2014  Decreased Interest 0 0 0 0 3  Down, Depressed, Hopeless 1 2 1  0 3  PHQ - 2 Score 1 2 1  0 6  Altered sleeping - - - - 3  Tired, decreased energy - - - - 3  Change in appetite - - - - 3  Feeling bad or failure about yourself  - - - - 3  Trouble concentrating - - - - 3  Moving slowly or fidgety/restless - - - - 2  Suicidal thoughts - - - - 1  PHQ-9 Score - - - - 24     Review of Systems  Constitutional: Positive for fatigue. Negative for fever, chills and activity change.  Cardiovascular: Negative for chest pain and palpitations.  Gastrointestinal: Negative for nausea, vomiting and abdominal pain.  Musculoskeletal: Positive for back  pain, joint swelling, arthralgias and gait problem.  Allergic/Immunologic: Negative for immunocompromised state.  Neurological: Negative for weakness and numbness.  Psychiatric/Behavioral: Positive for sleep disturbance and dysphoric mood. Negative for suicidal ideas, hallucinations, behavioral problems, confusion and self-injury. The patient is not nervous/anxious and is not hyperactive.       Objective:  BP 124/70 mmHg  Pulse 69  Temp(Src) 98.5 F (36.9 C) (Oral)  Resp 18  Ht 5' 1.5" (1.562 m)  Wt 228 lb 3.2 oz (103.511 kg)  BMI 42.43 kg/m2  SpO2 97%  Physical Exam  Constitutional: She  is oriented to person, place, and time. She appears well-developed and well-nourished. No distress.  HENT:  Head: Normocephalic and atraumatic.  Eyes: Pupils are equal, round, and reactive to light.  Neck: Neck supple.  Cardiovascular: Normal rate, regular rhythm and normal heart sounds.   No murmur heard. Pulmonary/Chest: Effort normal and breath sounds normal. No respiratory distress. She has no wheezes.  Musculoskeletal: Normal range of motion.  Neurological: She is alert and oriented to person, place, and time. Coordination normal.  Skin: Skin is warm and dry. She is not diaphoretic.  Psychiatric: She has a normal mood and affect. Her behavior is normal.  Nursing note and vitals reviewed.     Assessment & Plan:   1. Osteoporosis - stopped fosamax per [facility] - ?negligent absorption due to h/o gastric bypass. Change calcium 1200mg  to 600 bid and use the citrate salt rather than carbonate since occ uses ppis.  Pt cannot afford Prolia or IV infusion med that she was recommended. Going to start water aerobics as knees to painful to do weightbearing exercise.  Pt would like to cont to max out lifestyle and supp, then recheck dexa in 2 yrs (Feb 2019) to see if changed. If worsening, will reconsider IV meds at that time. (initial dexa was 11/2015 with t-score of -3.0  2. Vitamin D deficiency - cont on high dose.  Recheck level at next OV in 6 mos  3.      Adjustment disorder - doing well on cymbalta with prn klonopin qhs. Pt does not want to change doses. She has an incredible amount of stressors - working full-time in [employer], caring for her 429 yo daughter with multiple psychiatric illnesses including suicidal attempts and her grandchildren and other extended family members with her. 4.      Vit B12 def - has felt better since started oral replacement of 500u qd - recheck with next labs.  Reports levothyroxine dose stable for years.   F/u in 6 mos for fasting labs and  CPE. Ok to refill all meds prn until then.  Meds ordered this encounter  Medications  . clonazePAM (KLONOPIN) 1 MG tablet    Sig: Take 1 tablet (1 mg total) by mouth at bedtime as needed for anxiety.    Dispense:  30 tablet    Refill:  5  . DULoxetine (CYMBALTA) 30 MG capsule    Sig: Take 1 capsule (30 mg total) by mouth daily.    Dispense:  90 capsule    Refill:  3    I personally performed the services described in this documentation, which was scribed in my presence. The recorded information has been reviewed and considered, and addended by me as needed.   Norberto SorensonEva Avonte Sensabaugh, M.D.  [facility]  [place] [address] [place], [place] [address] [phone] phone [phone] fax  04/13/2016 1:39 PM

## 2016-04-13 NOTE — Patient Instructions (Addendum)
Make sure you are taking a daily calcium/vitamin D supplement - and twice a day would be great. Try to find a chewable calcium CITRATE supplement with 400 to 600 mg of calcium in it and as much vitamin D as you can.  Take this at least once a day, and not with other calcium sources for maximum absorption. Look for an off-brand that is like "Citracal" or "Caltrate" which you will be able to absorb better that the calcium CARBONATE products while you are on medicines for acid reflux.    Consider changing your vitamin B12 supplement to a sublingual (dissolves under your tongue) preparation.    IF you received an x-ray today, you will receive an invoice from [facility]. Please contact [facility] at [phone] with questions or concerns regarding your invoice.   IF you received labwork today, you will receive an invoice from [employer]. Please contact [facility] at [phone] with questions or concerns regarding your invoice.   Our billing staff will not be able to assist you with questions regarding bills from these companies.  You will be contacted with the lab results as soon as they are available. The fastest way to get your results is to activate your My Chart account. Instructions are located on the last page of this paperwork. If you have not heard from [place] regarding the results in 2 weeks, please contact this office.     We recommend that you schedule a mammogram for breast cancer screening. Typically, you do not need a referral to do this. Please contact a local imaging center to schedule your mammogram.  [facility] - [phone]  *ask for the Radiology Department [facility]) - [phone] or [phone]  [facility] - [phone] [facility] - [phone] [facility] - [phone]  *ask for the Radiology Department [facility] - [phone]  *ask for the Radiology Department [facility] - [phone]  *ask for the Mammography Department [facility] - [phone]  Bone Health Bones protect organs, store calcium, and anchor muscles. Good health habits, such as eating nutritious foods and exercising regularly, are important for maintaining healthy bones. They can also help to prevent a condition that causes bones to lose density and become weak and brittle (osteoporosis). WHY IS BONE MASS IMPORTANT? Bone mass refers to the amount of bone tissue that you have. The higher your bone mass, the stronger your bones. An important step toward having healthy bones throughout life is to have strong and dense bones during childhood. A young adult who has a high bone mass is more likely to have a high bone mass later in life. Bone mass at its greatest it is called peak bone mass. A large decline in bone mass occurs in older adults. In women, it occurs about the time of menopause. During this time, it is important to practice good health habits, because if more bone is lost than what is replaced, the bones will become less healthy and more likely to break (fracture). If you find that you have a low bone mass, you may be able to prevent osteoporosis or further bone loss by changing your diet and lifestyle. HOW CAN I FIND OUT IF MY BONE MASS IS LOW? Bone mass can be measured with an X-ray test that is called a bone mineral density (BMD) test. This test is recommended for all women who are age 84 or older.  It may also be recommended for men who are age 69 or older, or for people who are more likely to develop osteoporosis due to:  Having bones that break easily.  Having a long-term disease that weakens bones, such as kidney disease or rheumatoid arthritis.  Having menopause earlier than normal.  Taking medicine that weakens bones, such as steroids, thyroid hormones, or hormone treatment for breast cancer or prostate  cancer.  Smoking.  Drinking three or more alcoholic drinks each day. WHAT ARE THE NUTRITIONAL RECOMMENDATIONS FOR HEALTHY BONES? To have healthy bones, you need to get enough of the right minerals and vitamins. Most nutrition experts recommend getting these nutrients from the foods that you eat. Nutritional recommendations vary from person to person. Ask your health care provider what is healthy for you. Here are some general guidelines. Calcium Recommendations Calcium is the most important (essential) mineral for bone health. Most people can get enough calcium from their diet, but supplements may be recommended for people who are at risk for osteoporosis. Good sources of calcium include:  Dairy products, such as low-fat or nonfat milk, cheese, and yogurt.  Dark green leafy vegetables, such as bok choy and broccoli.  Calcium-fortified foods, such as orange juice, cereal, bread, soy beverages, and tofu products.  Nuts, such as almonds. Follow these recommended amounts for daily calcium intake:  Children, age 72-3: 700 mg.  Children, age 21-8: 1,000 mg.  Children, age 279-13: 1,300 mg.  Teens, age 69-18: 1,300 mg.  Adults, age 21419-50: 1,000 mg.  Adults, age 69-70:  Men: 1,000 mg.  Women: 1,200 mg.  Adults, age 69 or older: 1,200 mg.  Pregnant and breastfeeding females:  Teens: 1,300 mg.  Adults: 1,000 mg. Vitamin D Recommendations Vitamin D is the most essential vitamin for bone health. It helps the body to absorb calcium. Sunlight stimulates the skin to make vitamin D, so be sure to get enough sunlight. If you live in a cold climate or you do not get outside often, your health care provider may recommend that you take vitamin D supplements. Good sources of vitamin D in your diet include:  Egg yolks.  Saltwater fish.  Milk and cereal fortified with vitamin D. Follow these recommended amounts for daily vitamin D intake:  Children and teens, age 72-18: 600 international  units.  Adults, age 69 or younger: 400-800 international units.  Adults, age 69 or older: 800-1,000 international units. Other Nutrients Other nutrients for bone health include:  Phosphorus. This mineral is found in meat, poultry, dairy foods, nuts, and legumes. The recommended daily intake for adult men and adult women is 700 mg.  Magnesium. This mineral is found in seeds, nuts, dark green vegetables, and legumes. The recommended daily intake for adult men is 400-420 mg. For adult women, it is 310-320 mg.  Vitamin K. This vitamin is found in green leafy vegetables. The recommended daily intake is 120 mg for adult men and 90 mg for adult women. WHAT TYPE OF PHYSICAL ACTIVITY IS BEST FOR BUILDING AND MAINTAINING HEALTHY BONES? Weight-bearing and strength-building activities are important for building and maintaining peak bone mass. Weight-bearing activities cause muscles and bones to work against gravity. Strength-building activities increases muscle strength that supports bones. Weight-bearing and muscle-building activities include:  Walking and hiking.  Jogging and running.  Dancing.  Gym exercises.  Lifting weights.  Tennis and racquetball.  Climbing stairs.  Aerobics. Adults should get at least 30 minutes of moderate physical activity on most days. Children should get at  least 60 minutes of moderate physical activity on most days. Ask your health care provide what type of exercise is best for you. WHERE CAN I FIND MORE INFORMATION? For more information, check out the following websites:  National Osteoporosis Foundation: http://burton-owens.org/http://nof.org/learn/basics  [employer] of Health: http://www.niams.http://www.johnson-fowler.biz/nih.gov/Health_Info/Bone/Bone_Health/bone_health_for_life.asp   This information is not intended to replace advice given to you by your health care provider. Make sure you discuss any questions you have with your health care provider.   Document Released: 12/19/2003 Document  Revised: 02/12/2015 Document Reviewed: 10/03/2014 Elsevier Interactive Patient Education [employer]2016 Elsevier Inc.

## 2016-07-09 ENCOUNTER — Other Ambulatory Visit: Payer: Self-pay | Admitting: Physician Assistant

## 2016-09-21 ENCOUNTER — Other Ambulatory Visit: Payer: Self-pay | Admitting: Physician Assistant

## 2016-09-30 ENCOUNTER — Telehealth: Payer: Self-pay

## 2016-09-30 ENCOUNTER — Other Ambulatory Visit: Payer: Self-pay | Admitting: Family Medicine

## 2016-09-30 MED ORDER — LEVOTHYROXINE SODIUM 88 MCG PO TABS: 88.0000 ug | ORAL_TABLET | Freq: Every day | ORAL | 0 refills | Status: DC

## 2016-09-30 NOTE — Telephone Encounter (Signed)
Pt called and said pharm has been trying to get Rf of levothyroxine. We have not received any E-req's for any other meds. Sent in 1 mos of levo and Corrie DandyMary told that she is due for f/up then.

## 2016-10-02 ENCOUNTER — Other Ambulatory Visit: Payer: Self-pay

## 2016-10-02 MED ORDER — HYDROCHLOROTHIAZIDE 12.5 MG PO CAPS: ORAL_CAPSULE | ORAL | 0 refills | Status: DC

## 2016-10-02 NOTE — Telephone Encounter (Signed)
fax [facility] [place] req HCTZ  sent 30 with note to RTC asap

## 2016-10-02 NOTE — Telephone Encounter (Signed)
Will refill in Dr. Alver FisherShaw's absence, please let patient know script is printed.

## 2016-10-03 NOTE — Telephone Encounter (Signed)
Thank you. Please remind pt that I would like to see her for a complete physical with fasting labs In the near future and we can do any additional medication refills then.

## 2016-10-06 NOTE — Telephone Encounter (Signed)
Called to pharm. L/m for pt to make a px and lab appt (with pharm) her personal v/m was full

## 2016-10-29 ENCOUNTER — Other Ambulatory Visit: Payer: Self-pay | Admitting: Urgent Care

## 2016-10-29 ENCOUNTER — Other Ambulatory Visit: Payer: Self-pay | Admitting: Physician Assistant

## 2016-10-30 NOTE — Telephone Encounter (Signed)
Last ov and labs 09/2015 needs ov

## 2016-10-30 NOTE — Telephone Encounter (Signed)
09/2015 last ov and labs needs ov 

## 2016-11-11 ENCOUNTER — Other Ambulatory Visit: Payer: Self-pay

## 2016-11-11 NOTE — Telephone Encounter (Signed)
Fax req [facility] [address] for Lisinopril IC pt - Unable to leave message on cell-mailbox full, Home number disconnected, work number-incorrect.  Denied - has been advised needs office visit previously.

## 2016-11-16 DIAGNOSIS — I10 Essential (primary) hypertension: Secondary | ICD-10-CM | POA: Diagnosis not present

## 2016-11-16 DIAGNOSIS — M1990 Unspecified osteoarthritis, unspecified site: Secondary | ICD-10-CM | POA: Diagnosis not present

## 2016-11-16 DIAGNOSIS — Z9884 Bariatric surgery status: Secondary | ICD-10-CM | POA: Diagnosis not present

## 2016-11-16 DIAGNOSIS — M810 Age-related osteoporosis without current pathological fracture: Secondary | ICD-10-CM | POA: Diagnosis not present

## 2016-11-16 DIAGNOSIS — R21 Rash and other nonspecific skin eruption: Secondary | ICD-10-CM | POA: Diagnosis not present

## 2016-11-16 DIAGNOSIS — F418 Other specified anxiety disorders: Secondary | ICD-10-CM | POA: Diagnosis not present

## 2016-11-16 DIAGNOSIS — D649 Anemia, unspecified: Secondary | ICD-10-CM | POA: Diagnosis not present

## 2016-11-16 DIAGNOSIS — R7309 Other abnormal glucose: Secondary | ICD-10-CM | POA: Diagnosis not present

## 2016-11-16 DIAGNOSIS — E038 Other specified hypothyroidism: Secondary | ICD-10-CM | POA: Diagnosis not present

## 2016-11-16 DIAGNOSIS — E559 Vitamin D deficiency, unspecified: Secondary | ICD-10-CM | POA: Diagnosis not present

## 2016-11-28 ENCOUNTER — Other Ambulatory Visit: Payer: Self-pay | Admitting: Family Medicine

## 2016-12-14 ENCOUNTER — Other Ambulatory Visit: Payer: Self-pay | Admitting: Physician Assistant

## 2016-12-14 NOTE — Telephone Encounter (Signed)
Done

## 2016-12-27 ENCOUNTER — Other Ambulatory Visit: Payer: Self-pay | Admitting: Family Medicine

## 2016-12-29 NOTE — Telephone Encounter (Signed)
BP Readings from Last 3 Encounters:  04/13/16 124/70  12/20/15 130/72  10/03/15 125/70

## 2017-03-01 ENCOUNTER — Other Ambulatory Visit: Payer: Self-pay | Admitting: Family Medicine

## 2017-03-07 ENCOUNTER — Other Ambulatory Visit: Payer: Self-pay | Admitting: Physician Assistant

## 2017-03-09 ENCOUNTER — Other Ambulatory Visit: Payer: Self-pay | Admitting: Family Medicine

## 2017-03-29 ENCOUNTER — Other Ambulatory Visit: Payer: Self-pay | Admitting: Urgent Care

## 2017-03-30 NOTE — Telephone Encounter (Signed)
Pt is long-overdue for a f/u OV with FASTING labs. Please have her sched an appt within the next month so we can continue to make sure the medicines are keeping her healthy and safe before we refill them further. This is the second notice. Thanks.

## 2017-04-06 ENCOUNTER — Other Ambulatory Visit: Payer: Self-pay | Admitting: Family Medicine

## 2017-04-15 ENCOUNTER — Other Ambulatory Visit: Payer: Self-pay

## 2017-04-15 MED ORDER — VITAMIN D (ERGOCALCIFEROL) 1.25 MG (50000 UNIT) PO CAPS: ORAL_CAPSULE | ORAL | 0 refills | Status: DC

## 2017-05-01 ENCOUNTER — Other Ambulatory Visit: Payer: Self-pay | Admitting: Family Medicine

## 2017-05-05 ENCOUNTER — Telehealth: Payer: Self-pay | Admitting: Family Medicine

## 2017-05-05 NOTE — Telephone Encounter (Signed)
Sent unable to reach letter about OV for more refills on HCTZ 

## 2017-05-05 NOTE — Telephone Encounter (Signed)
Sent unable to reach letter about OV for more refills on HCTZ

## 2017-05-11 DIAGNOSIS — Z13220 Encounter for screening for lipoid disorders: Secondary | ICD-10-CM | POA: Diagnosis not present

## 2017-05-11 DIAGNOSIS — E039 Hypothyroidism, unspecified: Secondary | ICD-10-CM | POA: Diagnosis not present

## 2017-05-11 DIAGNOSIS — Z23 Encounter for immunization: Secondary | ICD-10-CM | POA: Diagnosis not present

## 2017-05-11 DIAGNOSIS — E538 Deficiency of other specified B group vitamins: Secondary | ICD-10-CM | POA: Diagnosis not present

## 2017-05-11 DIAGNOSIS — E559 Vitamin D deficiency, unspecified: Secondary | ICD-10-CM | POA: Diagnosis not present

## 2017-05-11 DIAGNOSIS — M810 Age-related osteoporosis without current pathological fracture: Secondary | ICD-10-CM | POA: Diagnosis not present

## 2017-05-11 DIAGNOSIS — Z9884 Bariatric surgery status: Secondary | ICD-10-CM | POA: Diagnosis not present

## 2017-05-11 DIAGNOSIS — E119 Type 2 diabetes mellitus without complications: Secondary | ICD-10-CM | POA: Diagnosis not present

## 2017-05-11 DIAGNOSIS — Z0000 Encounter for general adult medical examination without abnormal findings: Secondary | ICD-10-CM | POA: Diagnosis not present

## 2017-05-27 DIAGNOSIS — H40033 Anatomical narrow angle, bilateral: Secondary | ICD-10-CM | POA: Diagnosis not present

## 2017-05-27 DIAGNOSIS — H1013 Acute atopic conjunctivitis, bilateral: Secondary | ICD-10-CM | POA: Diagnosis not present

## 2017-06-18 ENCOUNTER — Telehealth: Payer: Self-pay | Admitting: Family Medicine

## 2017-06-18 NOTE — Telephone Encounter (Signed)
I left a message asking the patient to call and schedule her AWV-I with the nurse. Delton PrairieV. McCain

## 2017-08-21 ENCOUNTER — Emergency Department (HOSPITAL_BASED_OUTPATIENT_CLINIC_OR_DEPARTMENT_OTHER)
Admission: EM | Admit: 2017-08-21 | Discharge: 2017-08-21 | Disposition: A | Discharge status: Home / Self Care | Payer: Medicare HMO | Attending: Emergency Medicine | Admitting: Emergency Medicine

## 2017-08-21 ENCOUNTER — Other Ambulatory Visit: Payer: Self-pay

## 2017-08-21 ENCOUNTER — Encounter (HOSPITAL_BASED_OUTPATIENT_CLINIC_OR_DEPARTMENT_OTHER): Payer: Self-pay | Admitting: Emergency Medicine

## 2017-08-21 DIAGNOSIS — E119 Type 2 diabetes mellitus without complications: Secondary | ICD-10-CM | POA: Diagnosis not present

## 2017-08-21 DIAGNOSIS — I10 Essential (primary) hypertension: Secondary | ICD-10-CM | POA: Insufficient documentation

## 2017-08-21 DIAGNOSIS — M7918 Myalgia, other site: Secondary | ICD-10-CM | POA: Diagnosis not present

## 2017-08-21 DIAGNOSIS — Y92410 Unspecified street and highway as the place of occurrence of the external cause: Secondary | ICD-10-CM | POA: Insufficient documentation

## 2017-08-21 DIAGNOSIS — R51 Headache: Secondary | ICD-10-CM | POA: Diagnosis not present

## 2017-08-21 DIAGNOSIS — Y999 Unspecified external cause status: Secondary | ICD-10-CM | POA: Diagnosis not present

## 2017-08-21 DIAGNOSIS — Y9389 Activity, other specified: Secondary | ICD-10-CM | POA: Insufficient documentation

## 2017-08-21 DIAGNOSIS — M179 Osteoarthritis of knee, unspecified: Secondary | ICD-10-CM | POA: Diagnosis not present

## 2017-08-21 MED ORDER — DIAZEPAM 5 MG PO TABS: 2.5000 mg | ORAL_TABLET | Freq: Four times a day (QID) | ORAL | 0 refills | Status: DC | PRN
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
## 2017-09-04 DIAGNOSIS — M62838 Other muscle spasm: Secondary | ICD-10-CM | POA: Diagnosis not present

## 2017-09-24 DIAGNOSIS — M542 Cervicalgia: Secondary | ICD-10-CM | POA: Diagnosis not present

## 2017-09-24 DIAGNOSIS — M6281 Muscle weakness (generalized): Secondary | ICD-10-CM | POA: Diagnosis not present

## 2017-09-30 DIAGNOSIS — Z23 Encounter for immunization: Secondary | ICD-10-CM | POA: Diagnosis not present

## 2017-09-30 DIAGNOSIS — E119 Type 2 diabetes mellitus without complications: Secondary | ICD-10-CM | POA: Diagnosis not present

## 2017-09-30 DIAGNOSIS — Z9884 Bariatric surgery status: Secondary | ICD-10-CM | POA: Diagnosis not present

## 2017-09-30 DIAGNOSIS — I10 Essential (primary) hypertension: Secondary | ICD-10-CM | POA: Diagnosis not present

## 2017-09-30 DIAGNOSIS — E559 Vitamin D deficiency, unspecified: Secondary | ICD-10-CM | POA: Diagnosis not present

## 2017-09-30 DIAGNOSIS — R600 Localized edema: Secondary | ICD-10-CM | POA: Diagnosis not present

## 2017-09-30 DIAGNOSIS — D649 Anemia, unspecified: Secondary | ICD-10-CM | POA: Diagnosis not present

## 2017-09-30 DIAGNOSIS — M810 Age-related osteoporosis without current pathological fracture: Secondary | ICD-10-CM | POA: Diagnosis not present

## 2017-09-30 DIAGNOSIS — M1990 Unspecified osteoarthritis, unspecified site: Secondary | ICD-10-CM | POA: Diagnosis not present

## 2017-09-30 DIAGNOSIS — Z7984 Long term (current) use of oral hypoglycemic drugs: Secondary | ICD-10-CM | POA: Diagnosis not present

## 2017-10-01 DIAGNOSIS — M6281 Muscle weakness (generalized): Secondary | ICD-10-CM | POA: Diagnosis not present

## 2017-10-01 DIAGNOSIS — M542 Cervicalgia: Secondary | ICD-10-CM | POA: Diagnosis not present

## 2017-10-19 DIAGNOSIS — M6281 Muscle weakness (generalized): Secondary | ICD-10-CM | POA: Diagnosis not present

## 2017-10-19 DIAGNOSIS — M542 Cervicalgia: Secondary | ICD-10-CM | POA: Diagnosis not present

## 2017-11-03 DIAGNOSIS — M542 Cervicalgia: Secondary | ICD-10-CM | POA: Diagnosis not present

## 2017-11-03 DIAGNOSIS — M6281 Muscle weakness (generalized): Secondary | ICD-10-CM | POA: Diagnosis not present

## 2017-11-18 DIAGNOSIS — M6281 Muscle weakness (generalized): Secondary | ICD-10-CM | POA: Diagnosis not present

## 2017-11-18 DIAGNOSIS — M542 Cervicalgia: Secondary | ICD-10-CM | POA: Diagnosis not present

## 2017-12-30 DIAGNOSIS — E538 Deficiency of other specified B group vitamins: Secondary | ICD-10-CM | POA: Diagnosis not present

## 2017-12-30 DIAGNOSIS — E119 Type 2 diabetes mellitus without complications: Secondary | ICD-10-CM | POA: Diagnosis not present

## 2017-12-30 DIAGNOSIS — D649 Anemia, unspecified: Secondary | ICD-10-CM | POA: Diagnosis not present

## 2017-12-30 DIAGNOSIS — Z780 Asymptomatic menopausal state: Secondary | ICD-10-CM | POA: Diagnosis not present

## 2017-12-30 DIAGNOSIS — F418 Other specified anxiety disorders: Secondary | ICD-10-CM | POA: Diagnosis not present

## 2017-12-30 DIAGNOSIS — E039 Hypothyroidism, unspecified: Secondary | ICD-10-CM | POA: Diagnosis not present

## 2017-12-30 DIAGNOSIS — Z9884 Bariatric surgery status: Secondary | ICD-10-CM | POA: Diagnosis not present

## 2017-12-30 DIAGNOSIS — I10 Essential (primary) hypertension: Secondary | ICD-10-CM | POA: Diagnosis not present

## 2017-12-30 DIAGNOSIS — E559 Vitamin D deficiency, unspecified: Secondary | ICD-10-CM | POA: Diagnosis not present

## 2018-01-07 DIAGNOSIS — Z1211 Encounter for screening for malignant neoplasm of colon: Secondary | ICD-10-CM | POA: Diagnosis not present

## 2018-01-13 ENCOUNTER — Encounter: Payer: Self-pay | Admitting: Hematology and Oncology

## 2018-02-04 ENCOUNTER — Telehealth: Payer: Self-pay

## 2018-02-04 ENCOUNTER — Inpatient Hospital Stay: Payer: Medicare HMO

## 2018-02-04 ENCOUNTER — Inpatient Hospital Stay: Payer: Medicare HMO | Attending: Hematology and Oncology | Admitting: Hematology and Oncology

## 2018-02-04 ENCOUNTER — Encounter: Payer: Self-pay | Admitting: Hematology and Oncology

## 2018-02-04 DIAGNOSIS — Z87891 Personal history of nicotine dependence: Secondary | ICD-10-CM | POA: Diagnosis not present

## 2018-02-04 DIAGNOSIS — E559 Vitamin D deficiency, unspecified: Secondary | ICD-10-CM | POA: Insufficient documentation

## 2018-02-04 DIAGNOSIS — Z9884 Bariatric surgery status: Secondary | ICD-10-CM | POA: Diagnosis not present

## 2018-02-04 DIAGNOSIS — E039 Hypothyroidism, unspecified: Secondary | ICD-10-CM | POA: Diagnosis not present

## 2018-02-04 DIAGNOSIS — E119 Type 2 diabetes mellitus without complications: Secondary | ICD-10-CM | POA: Diagnosis not present

## 2018-02-04 DIAGNOSIS — D508 Other iron deficiency anemias: Secondary | ICD-10-CM | POA: Insufficient documentation

## 2018-02-04 DIAGNOSIS — M1990 Unspecified osteoarthritis, unspecified site: Secondary | ICD-10-CM | POA: Insufficient documentation

## 2018-02-04 DIAGNOSIS — F329 Major depressive disorder, single episode, unspecified: Secondary | ICD-10-CM | POA: Diagnosis not present

## 2018-02-04 DIAGNOSIS — I10 Essential (primary) hypertension: Secondary | ICD-10-CM | POA: Insufficient documentation

## 2018-02-04 DIAGNOSIS — E538 Deficiency of other specified B group vitamins: Secondary | ICD-10-CM | POA: Diagnosis not present

## 2018-02-04 DIAGNOSIS — Z79899 Other long term (current) drug therapy: Secondary | ICD-10-CM

## 2018-02-04 LAB — IRON AND TIBC
IRON: 13 ug/dL — AB (ref 41–142)
Saturation Ratios: 3 % — ABNORMAL LOW (ref 21–57)
TIBC: 464 ug/dL — AB (ref 236–444)
UIBC: 451 ug/dL

## 2018-02-04 LAB — CBC WITH DIFFERENTIAL (CANCER CENTER ONLY)
BASOS PCT: 1 %
Basophils Absolute: 0.1 10*3/uL (ref 0.0–0.1)
Eosinophils Absolute: 0.1 10*3/uL (ref 0.0–0.5)
Eosinophils Relative: 2 %
HEMATOCRIT: 32.5 % — AB (ref 34.8–46.6)
HEMOGLOBIN: 9.9 g/dL — AB (ref 11.6–15.9)
LYMPHS ABS: 2.2 10*3/uL (ref 0.9–3.3)
Lymphocytes Relative: 37 %
MCH: 22.9 pg — ABNORMAL LOW (ref 25.1–34.0)
MCHC: 30.5 g/dL — AB (ref 31.5–36.0)
MCV: 75.1 fL — ABNORMAL LOW (ref 79.5–101.0)
MONOS PCT: 9 %
Monocytes Absolute: 0.6 10*3/uL (ref 0.1–0.9)
NEUTROS PCT: 51 %
Neutro Abs: 3.1 10*3/uL (ref 1.5–6.5)
Platelet Count: 292 10*3/uL (ref 145–400)
RBC: 4.33 MIL/uL (ref 3.70–5.45)
RDW: 17.4 % — ABNORMAL HIGH (ref 11.2–14.5)
WBC Count: 6.1 10*3/uL (ref 3.9–10.3)

## 2018-02-04 LAB — CMP (CANCER CENTER ONLY)
ALT: 22 U/L (ref 0–55)
AST: 27 U/L (ref 5–34)
Albumin: 3.4 g/dL — ABNORMAL LOW (ref 3.5–5.0)
Alkaline Phosphatase: 86 U/L (ref 40–150)
Anion gap: 8 (ref 3–11)
BUN: 12 mg/dL (ref 7–26)
CALCIUM: 8.3 mg/dL — AB (ref 8.4–10.4)
CHLORIDE: 112 mmol/L — AB (ref 98–109)
CO2: 22 mmol/L (ref 22–29)
Creatinine: 0.92 mg/dL (ref 0.60–1.10)
GFR, Estimated: >60 mL/min (ref >60)
Glucose, Bld: 121 mg/dL (ref 70–140)
Potassium: 3.9 mmol/L (ref 3.5–5.1)
SODIUM: 142 mmol/L (ref 136–145)
Total Bilirubin: 0.4 mg/dL (ref 0.2–1.2)
Total Protein: 6.7 g/dL (ref 6.4–8.3)

## 2018-02-04 LAB — FERRITIN: FERRITIN: 6 ng/mL — AB (ref 9–269)

## 2018-02-06 NOTE — Assessment & Plan Note (Signed)
72 y.o. female with history of gastric bypass in 2014 presenting with iron deficiency anemia.  Exact etiology of iron deficiency is not fully elicited, but considering concurrent presence of vitamin D deficiency, absorption issues due to previous surgery are most likely.  Patient has not had any recent follow-up with gastroenterology or general surgery in terms of monitoring her altered anatomy.  Considering the patient has been attempting oral iron replacement without notable improvement for over a year, parenteral replacement is quite reasonable at this point in time.  Plan: - Labs today as outlined below. -Consult gastroenterology for possible EGD and colonoscopy to assess for possible blood loss sites. - Proceed with Feraheme infusions 510 mg IV x2 weekly -Return to clinic in 6 weeks with labs obtained 2 to 3 days prior, clinic visit, and possible additional IV iron replacement targeting ferritin to exceed 50, with possible adjustment of the target up to 100 if hematological recovery is insufficient at the lower target value.

## 2018-02-06 NOTE — Progress Notes (Signed)
[facility] New Visit:  Assessment: Anemia, iron deficiency 72 y.o. female with history of gastric bypass in 2014 presenting with iron deficiency anemia.  Exact etiology of iron deficiency is not fully elicited, but considering concurrent presence of vitamin D deficiency, absorption issues due to previous surgery are most likely.  Patient has not had any recent follow-up with gastroenterology or general surgery in terms of monitoring her altered anatomy.  Considering the patient has been attempting oral iron replacement without notable improvement for over a year, parenteral replacement is quite reasonable at this point in time.  Plan: - Labs today as outlined below. -Consult gastroenterology for possible EGD and colonoscopy to assess for possible blood loss sites. - Proceed with Feraheme infusions 510 mg IV x2 weekly -Return to clinic in 6 weeks with labs obtained 2 to 3 days prior, clinic visit, and possible additional IV iron replacement targeting ferritin to exceed 50, with possible adjustment of the target up to 100 if hematological recovery is insufficient at the lower target value.   Voice recognition software was used and creation of this note. Despite my best effort at editing the text, some misspelling/errors may have occurred. Orders Placed This Encounter  Procedures  . CBC with Differential (Cancer Center Only)    Standing Status:   Future    Number of Occurrences:   1    Standing Expiration Date:   02/05/2019  . CMP ([facility] only)    Standing Status:   Future    Number of Occurrences:   1    Standing Expiration Date:   02/05/2019  . Iron and TIBC    Standing Status:   Future    Number of Occurrences:   1    Standing Expiration Date:   02/05/2019  . Ferritin    Standing Status:   Future    Number of Occurrences:   1    Standing Expiration Date:   02/05/2019  . Ambulatory referral to Gastroenterology    Referral Priority:   Routine    Referral  Type:   Consultation    Referral Reason:   Specialty Services Required    Number of Visits Requested:   1    All questions were answered.  . The patient knows to call the clinic with any problems, questions or concerns.  This note was electronically signed.    History of Presenting Illness Nancy Hinton 72 y.o. presenting to the [facility] for evaluation of anemia, referred by Dr Glenford Bayley.  Patient's past medical history is significant for hypertension, depression, vitamin D deficiency, possible history of B12 deficiency, possible diabetes mellitus type 2, hypothyroidism, and history of gastric bypass for weight loss in 2014.  Patient is currently receiving oral supplement with ferrous sulfate as well as prenatal vitamin and has been taking both medications were approximately 1 year without missing significant portion of doses.  Patient is a former smoker and quit smoking 1987.  Patient has no family history of hematological disorders.  At this time, patient's complaints are minimal.  She reports no fevers, chills, night sweats.  Denies any lightheadedness, dizziness, weakness or sensory deficit in the extremities.  She does report fatigue and diminished energy, but denies any appetite changes, abdominal pain, diarrhea, or constipation.  Oncological/hematological History: --Labs, 05/11/17: Vit B12 274, Vit D-25(OH) 26.3;              TSH 1.92 --Labs, 09/30/17: Hgb 9.9, MCV 72.7, MCH 22.6, MCHC 31.0,  RDW 17.1, Plt 431;  --Labs, 12/30/17: Hgb 9.9, MCV 72.4, MCH 22.6, MCHC 31.1, RDW 18.3, Plt 386; Fe 20, FeSat 4%, TIBC 555, Ferritin 5, Transferrin 397;  TSH 2.88   Medical History: Past Medical History:  Diagnosis Date  . Arthritis   . Diabetes mellitus without complication ([place])    history of DM prior to gastric bypass, she has not had to be on medicine since 170 lb weightloss.  . Hypertension     Surgical History: Past Surgical History:  Procedure Laterality Date  . COLON  RESECTION    . GASTRIC BYPASS    . TONSILLECTOMY      Family History: Family History  Problem Relation Age of Onset  . Alzheimer's disease Mother   . Heart disease Father   . Heart attack Maternal Grandmother     Social History: Social History   Socioeconomic History  . Marital status: Divorced    Spouse name: Not on file  . Number of children: Not on file  . Years of education: Not on file  . Highest education level: Not on file  Occupational History  . Not on file  Social Needs  . Financial resource strain: Not on file  . Food insecurity:    Worry: Not on file    Inability: Not on file  . Transportation needs:    Medical: Not on file    Non-medical: Not on file  Tobacco Use  . Smoking status: Never Smoker  . Smokeless tobacco: Never Used  Substance and Sexual Activity  . Alcohol use: No  . Drug use: No  . Sexual activity: Never  Lifestyle  . Physical activity:    Days per week: Not on file    Minutes per session: Not on file  . Stress: Not on file  Relationships  . Social connections:    Talks on phone: Not on file    Gets together: Not on file    Attends religious service: Not on file    Active member of club or organization: Not on file    Attends meetings of clubs or organizations: Not on file    Relationship status: Not on file  . Intimate partner violence:    Fear of current or ex partner: Not on file    Emotionally abused: Not on file    Physically abused: Not on file    Forced sexual activity: Not on file  Other Topics Concern  . Not on file  Social History Narrative  . Not on file    Allergies: Allergies  Allergen Reactions  . Codeine Nausea Only    Medications:  Current Outpatient Medications  Medication Sig Dispense Refill  . clonazePAM (KLONOPIN) 1 MG tablet TAKE 1 TABLET BY MOUTH EVERY NIGHT AT BEDTIME AS NEEDED FOR ANXIETY 30 tablet 0  . diazepam (VALIUM) 5 MG tablet Take 0.5-1 tablets (2.5-5 mg total) every 6 (six) hours as  needed by mouth (spasms). 10 tablet 0  . furosemide (LASIX) 20 MG tablet Take 20 mg by mouth.    . levothyroxine (SYNTHROID, LEVOTHROID) 88 MCG tablet TAKE 1 TABLET BY MOUTH EVERY DAY BEFORE BREAKFAST 30 tablet 0  . lisinopril (PRINIVIL,ZESTRIL) 20 MG tablet Take 1 tablet (20 mg total) by mouth daily. 90 tablet 3  . Multiple Vitamin (MULITIVITAMIN WITH MINERALS) TABS Take 1 tablet by mouth daily. Reported on 12/20/2015    . Vitamin D, Ergocalciferol, (DRISDOL) 50000 units CAPS capsule TAKE 1 CAPSULE BY MOUTH EVERY TUESDAY 2 capsule 0  No current facility-administered medications for this visit.     Review of Systems: Review of Systems  All other systems reviewed and are negative.    PHYSICAL EXAMINATION There were no vitals taken for this visit.  ECOG PERFORMANCE STATUS: 1 - Symptomatic but completely ambulatory  Physical Exam  Constitutional: She is oriented to person, place, and time. She appears well-developed and well-nourished. No distress.  HENT:  Head: Normocephalic and atraumatic.  Mouth/Throat: Oropharynx is clear and moist. No oropharyngeal exudate.  Eyes: Pupils are equal, round, and reactive to light. Conjunctivae and EOM are normal. No scleral icterus.  Neck: No thyromegaly present.  Cardiovascular: Normal rate, regular rhythm, normal heart sounds and intact distal pulses. Exam reveals no gallop and no friction rub.  No murmur heard. Pulmonary/Chest: Effort normal and breath sounds normal. No stridor. No respiratory distress. She has no wheezes.  Abdominal: Soft. Bowel sounds are normal. She exhibits no distension and no mass. There is no tenderness. There is no guarding.  Musculoskeletal: She exhibits no edema.  Lymphadenopathy:    She has no cervical adenopathy.  Neurological: She is alert and oriented to person, place, and time. She displays normal reflexes. No cranial nerve deficit or sensory deficit.  Skin: Skin is warm and dry. No rash noted. She is not  diaphoretic. No erythema. There is pallor.     LABORATORY DATA: I have personally reviewed the data as listed: Appointment on 02/04/2018  Component Date Value Ref Range Status  . Ferritin 02/04/2018 6* 9 - 269 ng/mL Final   Performed at [facility], [place] [address], [place], [place] [address]  . Iron 02/04/2018 13* 41 - 142 ug/dL Final  . TIBC 02/04/2018 464* 236 - 444 ug/dL Final  . Saturation Ratios 02/04/2018 3* 21 - 57 % Final  . UIBC 02/04/2018 451  ug/dL Final   Performed at [facility], [place] [address], [place], [place] [address]  . Sodium 02/04/2018 142  136 - 145 mmol/L Final  . Potassium 02/04/2018 3.9  3.5 - 5.1 mmol/L Final  . Chloride 02/04/2018 112* 98 - 109 mmol/L Final  . CO2 02/04/2018 22  22 - 29 mmol/L Final  . Glucose, Bld 02/04/2018 121  70 - 140 mg/dL Final  . BUN 02/04/2018 12  7 - 26 mg/dL Final  . Creatinine 02/04/2018 0.92  0.60 - 1.10 mg/dL Final  . Calcium 02/04/2018 8.3* 8.4 - 10.4 mg/dL Final  . Total Protein 02/04/2018 6.7  6.4 - 8.3 g/dL Final  . Albumin 02/04/2018 3.4* 3.5 - 5.0 g/dL Final  . AST 02/04/2018 27  5 - 34 U/L Final  . ALT 02/04/2018 22  0 - 55 U/L Final  . Alkaline Phosphatase 02/04/2018 86  40 - 150 U/L Final  . Total Bilirubin 02/04/2018 0.4  0.2 - 1.2 mg/dL Final  . GFR, Est Non Af Am 02/04/2018 >60  >60 mL/min Final  . GFR, Est AFR Am 02/04/2018 >60  >60 mL/min Final   Comment: (NOTE) The eGFR has been calculated using the CKD EPI equation. This calculation has not been validated in all clinical situations. eGFR's persistently <60 mL/min signify possible Chronic Kidney Disease.   Georgiann Hahn gap 02/04/2018 8  3 - 11 Final   Performed at [facility], [place] [address] [place],  [address]  . WBC Count 02/04/2018 6.1  3.9 - 10.3 K/uL Final  . RBC 02/04/2018 4.33  3.70 - 5.45 MIL/uL Final  .  Hemoglobin 02/04/2018 9.9* 11.6 - 15.9 g/dL Final  . HCT  02/04/2018 32.5* 34.8 - 46.6 % Final  . MCV 02/04/2018 75.1* 79.5 - 101.0 fL Final  . MCH 02/04/2018 22.9* 25.1 - 34.0 pg Final  . MCHC 02/04/2018 30.5* 31.5 - 36.0 g/dL Final  . RDW 02/04/2018 17.4* 11.2 - 14.5 % Final  . Platelet Count 02/04/2018 292  145 - 400 K/uL Final  . Neutrophils Relative % 02/04/2018 51  % Final  . Neutro Abs 02/04/2018 3.1  1.5 - 6.5 K/uL Final  . Lymphocytes Relative 02/04/2018 37  % Final  . Lymphs Abs 02/04/2018 2.2  0.9 - 3.3 K/uL Final  . Monocytes Relative 02/04/2018 9  % Final  . Monocytes Absolute 02/04/2018 0.6  0.1 - 0.9 K/uL Final  . Eosinophils Relative 02/04/2018 2  % Final  . Eosinophils Absolute 02/04/2018 0.1  0.0 - 0.5 K/uL Final  . Basophils Relative 02/04/2018 1  % Final  . Basophils Absolute 02/04/2018 0.1  0.0 - 0.1 K/uL Final   Performed at [facility], [place] [address] [place], [place] [address]         Ardath Sax, MD

## 2018-02-10 ENCOUNTER — Ambulatory Visit (HOSPITAL_COMMUNITY)
Admission: RE | Admit: 2018-02-10 | Discharge: 2018-02-10 | Disposition: A | Discharge status: Home / Self Care | Payer: Medicare HMO | Source: Ambulatory Visit | Attending: Hematology and Oncology | Admitting: Hematology and Oncology

## 2018-02-10 DIAGNOSIS — D509 Iron deficiency anemia, unspecified: Secondary | ICD-10-CM | POA: Diagnosis not present

## 2018-02-10 MED ORDER — SODIUM CHLORIDE 0.9 % IV SOLN
Freq: Once | INTRAVENOUS | Status: AC
  Administered 2018-02-10: 11:00:00 via INTRAVENOUS

## 2018-02-10 MED ORDER — SODIUM CHLORIDE 0.9 % IV SOLN
510.0000 mg | Freq: Once | INTRAVENOUS | Status: AC
  Administered 2018-02-10: 510 mg via INTRAVENOUS
  Filled 2018-02-10: qty 17

## 2018-02-10 NOTE — Discharge Instructions (Signed)

## 2018-02-10 NOTE — Progress Notes (Signed)
Patient received Feraheme via PIV. Tolerated well, vitals stable, discharge instructions given, verbalized understanding. Patient alert, oriented and ambulatory at the time of discharge.  

## 2018-02-11 DIAGNOSIS — Z1231 Encounter for screening mammogram for malignant neoplasm of breast: Secondary | ICD-10-CM | POA: Diagnosis not present

## 2018-02-11 DIAGNOSIS — M810 Age-related osteoporosis without current pathological fracture: Secondary | ICD-10-CM | POA: Diagnosis not present

## 2018-02-11 DIAGNOSIS — M85851 Other specified disorders of bone density and structure, right thigh: Secondary | ICD-10-CM | POA: Diagnosis not present

## 2018-02-18 ENCOUNTER — Telehealth: Payer: Self-pay

## 2018-02-18 ENCOUNTER — Ambulatory Visit (HOSPITAL_COMMUNITY)
Admission: RE | Admit: 2018-02-18 | Discharge: 2018-02-18 | Disposition: A | Discharge status: Home / Self Care | Payer: Medicare HMO | Source: Ambulatory Visit | Attending: Hematology and Oncology | Admitting: Hematology and Oncology

## 2018-02-18 DIAGNOSIS — D509 Iron deficiency anemia, unspecified: Secondary | ICD-10-CM | POA: Diagnosis not present

## 2018-02-18 MED ORDER — SODIUM CHLORIDE 0.9 % IV SOLN
510.0000 mg | Freq: Once | INTRAVENOUS | Status: AC
  Administered 2018-02-18: 510 mg via INTRAVENOUS
  Filled 2018-02-18: qty 17

## 2018-02-18 MED ORDER — SODIUM CHLORIDE 0.9 % IV SOLN
INTRAVENOUS | Status: DC
  Administered 2018-02-18: 10 mL/h via INTRAVENOUS

## 2018-02-18 NOTE — Progress Notes (Signed)
Pt arrived for IV infusion of feraheme; upon completion of infusion, approximate 2 in area around IV site bruising noted on pt's arm; IV flushed with normal saline and removed with catheter intact; pt denies pain, burning, or itching; pharmacy contacted about possible complications if IV had infiltrated; recommendation to remove IV immediately, elevate arm; pt advised to monitor site for pain, swelling; Dr. Almetta Lovely office contacted; left message on nurse line for return call; pt observed for 30 minutes post infusion with no adverse complications noted

## 2018-02-18 NOTE — Telephone Encounter (Signed)
Opened by error.

## 2018-02-18 NOTE — Discharge Instructions (Signed)

## 2018-02-18 NOTE — Telephone Encounter (Signed)
Received phone call from Palmer, RN regarding potential feraheme infiltration. Bruising noted after feraheme infusion completed. 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 Affected arm was elevated and ice applied. Pt denied pain or discomfort, and no edema was present. Per Yvonne Kendall, pt was also instructed to call the Cancer Center if she noticed any changes in appearance of the site or feelings of discomfort. In-basket sent to Dr. Gweneth Dimitri to make him aware. Pt care transferred to Dr. Candise Che as of 03/12/18. F/u appt 03/18/18.

## 2018-03-15 ENCOUNTER — Other Ambulatory Visit: Payer: Self-pay

## 2018-03-15 DIAGNOSIS — D508 Other iron deficiency anemias: Secondary | ICD-10-CM

## 2018-03-16 ENCOUNTER — Inpatient Hospital Stay: Payer: Medicare HMO | Attending: Hematology and Oncology

## 2018-03-16 DIAGNOSIS — F329 Major depressive disorder, single episode, unspecified: Secondary | ICD-10-CM | POA: Insufficient documentation

## 2018-03-16 DIAGNOSIS — E039 Hypothyroidism, unspecified: Secondary | ICD-10-CM | POA: Insufficient documentation

## 2018-03-16 DIAGNOSIS — Z9884 Bariatric surgery status: Secondary | ICD-10-CM | POA: Diagnosis not present

## 2018-03-16 DIAGNOSIS — E538 Deficiency of other specified B group vitamins: Secondary | ICD-10-CM | POA: Insufficient documentation

## 2018-03-16 DIAGNOSIS — E119 Type 2 diabetes mellitus without complications: Secondary | ICD-10-CM | POA: Insufficient documentation

## 2018-03-16 DIAGNOSIS — L988 Other specified disorders of the skin and subcutaneous tissue: Secondary | ICD-10-CM | POA: Insufficient documentation

## 2018-03-16 DIAGNOSIS — D508 Other iron deficiency anemias: Secondary | ICD-10-CM | POA: Insufficient documentation

## 2018-03-16 DIAGNOSIS — Z87891 Personal history of nicotine dependence: Secondary | ICD-10-CM | POA: Diagnosis not present

## 2018-03-16 DIAGNOSIS — E559 Vitamin D deficiency, unspecified: Secondary | ICD-10-CM | POA: Insufficient documentation

## 2018-03-16 DIAGNOSIS — Z79899 Other long term (current) drug therapy: Secondary | ICD-10-CM | POA: Diagnosis not present

## 2018-03-16 DIAGNOSIS — I10 Essential (primary) hypertension: Secondary | ICD-10-CM | POA: Diagnosis not present

## 2018-03-16 DIAGNOSIS — M1990 Unspecified osteoarthritis, unspecified site: Secondary | ICD-10-CM | POA: Diagnosis not present

## 2018-03-16 LAB — CBC WITH DIFFERENTIAL (CANCER CENTER ONLY)
BASOS ABS: 0.1 10*3/uL (ref 0.0–0.1)
BASOS PCT: 1 %
Eosinophils Absolute: 0.2 10*3/uL (ref 0.0–0.5)
Eosinophils Relative: 3 %
HEMATOCRIT: 39 % (ref 34.8–46.6)
HEMOGLOBIN: 12.2 g/dL (ref 11.6–15.9)
LYMPHS PCT: 30 %
Lymphs Abs: 2 10*3/uL (ref 0.9–3.3)
MCH: 25.8 pg (ref 25.1–34.0)
MCHC: 31.3 g/dL — AB (ref 31.5–36.0)
MCV: 82.6 fL (ref 79.5–101.0)
MONOS PCT: 5 %
Monocytes Absolute: 0.3 10*3/uL (ref 0.1–0.9)
NEUTROS PCT: 61 %
Neutro Abs: 4.1 10*3/uL (ref 1.5–6.5)
Platelet Count: 311 10*3/uL (ref 145–400)
RBC: 4.72 MIL/uL (ref 3.70–5.45)
RDW: 23.1 % — ABNORMAL HIGH (ref 11.2–14.5)
WBC Count: 6.6 10*3/uL (ref 3.9–10.3)

## 2018-03-16 LAB — CMP (CANCER CENTER ONLY)
ALBUMIN: 3.4 g/dL — AB (ref 3.5–5.0)
ALT: 21 U/L (ref 0–55)
AST: 23 U/L (ref 5–34)
Alkaline Phosphatase: 92 U/L (ref 40–150)
Anion gap: 9 (ref 3–11)
BILIRUBIN TOTAL: 0.3 mg/dL (ref 0.2–1.2)
BUN: 12 mg/dL (ref 7–26)
CALCIUM: 8.2 mg/dL — AB (ref 8.4–10.4)
CO2: 22 mmol/L (ref 22–29)
Chloride: 110 mmol/L — ABNORMAL HIGH (ref 98–109)
Creatinine: 0.84 mg/dL (ref 0.60–1.10)
GFR, Est AFR Am: >60 mL/min (ref >60)
GLUCOSE: 115 mg/dL (ref 70–140)
Potassium: 3.4 mmol/L — ABNORMAL LOW (ref 3.5–5.1)
Sodium: 141 mmol/L (ref 136–145)
TOTAL PROTEIN: 6.4 g/dL (ref 6.4–8.3)

## 2018-03-17 LAB — IRON AND TIBC
Iron: 36 ug/dL — ABNORMAL LOW (ref 41–142)
Saturation Ratios: 11 % — ABNORMAL LOW (ref 21–57)
TIBC: 340 ug/dL (ref 236–444)
UIBC: 304 ug/dL

## 2018-03-17 LAB — FERRITIN: FERRITIN: 39 ng/mL (ref 9–269)

## 2018-03-17 NOTE — Progress Notes (Signed)
HEMATOLOGY/ONCOLOGY CONSULTATION NOTE  Date of Service: 03/18/2018  Patient Care Team: System, Pcp Not In as PCP - General  Dr. Hamilton Capri as PCP  CHIEF COMPLAINTS/PURPOSE OF CONSULTATION:  Iron deficiency anemia  HISTORY OF PRESENTING ILLNESS:   Nancy Hinton is a wonderful 72 y.o. female who has been referred to [place] by my colleague Dr Milinda Antis for evaluation and management of Iron deficiency anemia. The pt reports that she is doing well overall.   The pt reports some discoloration of her skin at her left elbow where she received her IV iron infusion. She describes it as feeling like poor circulation but denies it being painful.  She notes that she is not very concerned about this. She has not taken PO Iron replacement since her infusion, but had been taking it for 5-6 years prior to this and after her gastric bypass.   She also notes that she is taking a water pill a few times a week but denies taking potassium replacement. She is being followed by Dr Conley Rolls for Vitamin D deficiency.   She notes that she has had gastric bypass 5-6 years ago and she has been referred back to Dr Myrtie Neither in GI. She has had cologuard testing.  Most recent lab results (03/16/18) of CBC and CMP is as follows: all values are WNL except for MCHC at 31.3, RDW at 23.1, Potassium at 3.4, Chloride at 110, Calcium at 8.2, Albumin at 3.4. Ferritin 03/16/18 is at 39 Iron/TIBC 03/16/18 showed an 11% saturation ratio.   On review of systems, pt reports left elbow skin discoloration, stable weight, and denies black stools, blood in the stools, changes in bowel habits, abdominal pains, and any other symptoms.    MEDICAL HISTORY:  Past Medical History:  Diagnosis Date  . Arthritis   . Diabetes mellitus without complication ([facility])    history of DM prior to gastric bypass, she has not had to be on medicine since 170 lb weightloss.  . Hypertension     SURGICAL HISTORY: Past Surgical History:  Procedure Laterality  Date  . COLON RESECTION    . GASTRIC BYPASS    . TONSILLECTOMY      SOCIAL HISTORY: Social History   Socioeconomic History  . Marital status: Divorced    Spouse name: Not on file  . Number of children: Not on file  . Years of education: Not on file  . Highest education level: Not on file  Occupational History  . Not on file  Social Needs  . Financial resource strain: Not on file  . Food insecurity:    Worry: Not on file    Inability: Not on file  . Transportation needs:    Medical: Not on file    Non-medical: Not on file  Tobacco Use  . Smoking status: Never Smoker  . Smokeless tobacco: Never Used  Substance and Sexual Activity  . Alcohol use: No  . Drug use: No  . Sexual activity: Never  Lifestyle  . Physical activity:    Days per week: Not on file    Minutes per session: Not on file  . Stress: Not on file  Relationships  . Social connections:    Talks on phone: Not on file    Gets together: Not on file    Attends religious service: Not on file    Active member of club or organization: Not on file    Attends meetings of clubs or organizations: Not on file  Relationship status: Not on file  . Intimate partner violence:    Fear of current or ex partner: Not on file    Emotionally abused: Not on file    Physically abused: Not on file    Forced sexual activity: Not on file  Other Topics Concern  . Not on file  Social History Narrative  . Not on file    FAMILY HISTORY: Family History  Problem Relation Age of Onset  . Alzheimer's disease Mother   . Heart disease Father   . Heart attack Maternal Grandmother     ALLERGIES:  is allergic to codeine.  MEDICATIONS:  Current Outpatient Medications  Medication Sig Dispense Refill  . clonazePAM (KLONOPIN) 1 MG tablet TAKE 1 TABLET BY MOUTH EVERY NIGHT AT BEDTIME AS NEEDED FOR ANXIETY 30 tablet 0  . diazepam (VALIUM) 5 MG tablet Take 0.5-1 tablets (2.5-5 mg total) every 6 (six) hours as needed by mouth  (spasms). 10 tablet 0  . furosemide (LASIX) 20 MG tablet Take 20 mg by mouth.    . levothyroxine (SYNTHROID, LEVOTHROID) 88 MCG tablet TAKE 1 TABLET BY MOUTH EVERY DAY BEFORE BREAKFAST 30 tablet 0  . lisinopril (PRINIVIL,ZESTRIL) 20 MG tablet Take 1 tablet (20 mg total) by mouth daily. 90 tablet 3  . Multiple Vitamin (MULITIVITAMIN WITH MINERALS) TABS Take 1 tablet by mouth daily. Reported on 12/20/2015    . Vitamin D, Ergocalciferol, (DRISDOL) 50000 units CAPS capsule TAKE 1 CAPSULE BY MOUTH EVERY TUESDAY 2 capsule 0   No current facility-administered medications for this visit.     REVIEW OF SYSTEMS:    10 Point review of Systems was done is negative except as noted above.  PHYSICAL EXAMINATION:  . Vitals:   03/18/18 1231  BP: (!) 178/88  Pulse: 60  Resp: 18  Temp: 97.8 F (36.6 C)  SpO2: 100%   Filed Weights   03/18/18 1231  Weight: 246 lb 11.2 oz (111.9 kg)   .Body mass index is 42.35 kg/m.  GENERAL:alert, in no acute distress and comfortable SKIN: no acute rashes, no significant lesions EYES: conjunctiva are pink and non-injected, sclera anicteric OROPHARYNX: MMM, no exudates, no oropharyngeal erythema or ulceration NECK: supple, no JVD LYMPH:  no palpable lymphadenopathy in the cervical, axillary or inguinal regions LUNGS: clear to auscultation b/l with normal respiratory effort HEART: regular rate & rhythm ABDOMEN:  normoactive bowel sounds , non tender, not distended. Extremity: no pedal edema PSYCH: alert & oriented x 3 with fluent speech NEURO: no focal motor/sensory deficits  LABORATORY DATA:  I have reviewed the data as listed  . CBC Latest Ref Rng & Units 03/16/2018 02/04/2018 10/03/2015  WBC 3.9 - 10.3 K/uL 6.6 6.1 6.5  Hemoglobin 11.6 - 15.9 g/dL 16.112.2 0.9(U9.9(L) 04.512.5  Hematocrit 34.8 - 46.6 % 39.0 32.5(L) 38.5  Platelets 145 - 400 K/uL 311 292 336    . CMP Latest Ref Rng & Units 03/16/2018 02/04/2018 10/03/2015  Glucose 70 - 140 mg/dL 409115 811121 914(N162(H)    BUN 7 - 26 mg/dL 12 12 15   Creatinine 0.60 - 1.10 mg/dL 8.290.84 5.620.92 1.300.86  Sodium 136 - 145 mmol/L 141 142 137  Potassium 3.5 - 5.1 mmol/L 3.4(L) 3.9 4.3  Chloride 98 - 109 mmol/L 110(H) 112(H) 102  CO2 22 - 29 mmol/L 22 22 26   Calcium 8.4 - 10.4 mg/dL 8.2(L) 8.3(L) 8.8  Total Protein 6.4 - 8.3 g/dL 6.4 6.7 6.7  Total Bilirubin 0.2 - 1.2 mg/dL 0.3 0.4 0.6  Alkaline  Phos 40 - 150 U/L 92 86 83  AST 5 - 34 U/L 23 27 19   ALT 0 - 55 U/L 21 22 18    . Lab Results  Component Value Date   IRON 36 (L) 03/16/2018   TIBC 340 03/16/2018   IRONPCTSAT 11 (L) 03/16/2018   (Iron and TIBC)  Lab Results  Component Value Date   FERRITIN 39 03/16/2018     RADIOGRAPHIC STUDIES: I have personally reviewed the radiological images as listed and agreed with the findings in the report. No results found.  ASSESSMENT & PLAN:  72 y.o. female with   1. Iron Deficiency Anemia ?absoprtion issues related to gastric bypass surgery. No clinically overt GI bleeding noted. PLAN -Discussed patient's most recent labs from 03/16/18, Hgb has normalized to 12.2 and MCV has normalized to 82.6, Ferritin increased from 6 to 39.  -Recommend potassium replacement while taking Lasix -Iron deficiency could be due to absorption issues, in context of having had a gastric bypass -Follow up with GI Dr Myrtie Neither on 04/01/18 - will defer ot GI rgarding need for GI w/u -Would like to proceed with IV Injectafer again today to further replenish Ferritin to maintain ferritin >100 in setting of absorption issues and likely chronic need for IV iron replacement.  2. . Patient Active Problem List   Diagnosis Date Noted  . Iron deficiency anemia secondary to inadequate dietary iron intake 02/04/2018  . Osteoporosis 12/20/2015  . Vitamin D deficiency 10/08/2015  . Thyroid activity decreased 10/08/2015  . Arthritis 10/08/2015  . H/O gastric bypass 10/08/2015  . Anemia, iron deficiency 12/21/2014  . Essential hypertension 12/21/2014    -mx per PCP RTC with Dr Candise Che in 4 months with labs   All of the patients questions were answered with apparent satisfaction. The patient knows to call the clinic with any problems, questions or concerns.  The toal time spent in the appt was 25 minutes and more than 50% was on counseling and direct patient cares.    Wyvonnia Lora MD MS AAHIVMS [facility] Hematology/Oncology Physician [facility]  (Office):       [phone] (Work cell):  [phone] (Fax):           [phone]  03/18/2018 12:54 PM  I, Marcelline Mates, am acting as a Neurosurgeon for Dr Candise Che.   .I have reviewed the above documentation for accuracy and completeness, and I agree with the above. Johney Maine MD

## 2018-03-18 ENCOUNTER — Inpatient Hospital Stay: Payer: Medicare HMO

## 2018-03-18 ENCOUNTER — Inpatient Hospital Stay (HOSPITAL_BASED_OUTPATIENT_CLINIC_OR_DEPARTMENT_OTHER): Payer: Medicare HMO | Admitting: Hematology

## 2018-03-18 VITALS — BP 178/88 | HR 60 | Temp 97.8°F | Resp 18 | Ht 64.0 in | Wt 246.7 lb

## 2018-03-18 VITALS — BP 160/74 | HR 78 | Resp 16

## 2018-03-18 DIAGNOSIS — D508 Other iron deficiency anemias: Secondary | ICD-10-CM

## 2018-03-18 DIAGNOSIS — Z9884 Bariatric surgery status: Secondary | ICD-10-CM | POA: Diagnosis not present

## 2018-03-18 DIAGNOSIS — F329 Major depressive disorder, single episode, unspecified: Secondary | ICD-10-CM | POA: Diagnosis not present

## 2018-03-18 DIAGNOSIS — E559 Vitamin D deficiency, unspecified: Secondary | ICD-10-CM | POA: Diagnosis not present

## 2018-03-18 DIAGNOSIS — L988 Other specified disorders of the skin and subcutaneous tissue: Secondary | ICD-10-CM | POA: Diagnosis not present

## 2018-03-18 DIAGNOSIS — Z79899 Other long term (current) drug therapy: Secondary | ICD-10-CM

## 2018-03-18 DIAGNOSIS — E119 Type 2 diabetes mellitus without complications: Secondary | ICD-10-CM

## 2018-03-18 DIAGNOSIS — M1990 Unspecified osteoarthritis, unspecified site: Secondary | ICD-10-CM | POA: Diagnosis not present

## 2018-03-18 DIAGNOSIS — E538 Deficiency of other specified B group vitamins: Secondary | ICD-10-CM | POA: Diagnosis not present

## 2018-03-18 DIAGNOSIS — E039 Hypothyroidism, unspecified: Secondary | ICD-10-CM | POA: Diagnosis not present

## 2018-03-18 DIAGNOSIS — I10 Essential (primary) hypertension: Secondary | ICD-10-CM | POA: Diagnosis not present

## 2018-03-18 DIAGNOSIS — Z87891 Personal history of nicotine dependence: Secondary | ICD-10-CM | POA: Diagnosis not present

## 2018-03-18 MED ORDER — SODIUM CHLORIDE 0.9 % IV SOLN
510.0000 mg | Freq: Once | INTRAVENOUS | Status: AC
  Administered 2018-03-18: 510 mg via INTRAVENOUS
  Filled 2018-03-18: qty 17

## 2018-03-18 MED ORDER — SODIUM CHLORIDE 0.9 % IV SOLN
Freq: Once | INTRAVENOUS | Status: AC
  Administered 2018-03-18: 13:00:00 via INTRAVENOUS

## 2018-03-18 NOTE — Patient Instructions (Signed)

## 2018-03-22 ENCOUNTER — Telehealth: Payer: Self-pay | Admitting: Hematology

## 2018-03-22 NOTE — Telephone Encounter (Signed)
Spoke w/ pt regarding added appt on 10/4 beginning at 11 am. Patient updated her calendar and did not need calendar mailed to her.

## 2018-03-30 ENCOUNTER — Encounter: Payer: Self-pay | Admitting: Gastroenterology

## 2018-03-30 ENCOUNTER — Ambulatory Visit: Payer: Medicare HMO | Admitting: Gastroenterology

## 2018-03-30 VITALS — BP 134/82 | HR 72 | Ht 64.0 in | Wt 250.6 lb

## 2018-03-30 DIAGNOSIS — Z9884 Bariatric surgery status: Secondary | ICD-10-CM | POA: Diagnosis not present

## 2018-03-30 DIAGNOSIS — D509 Iron deficiency anemia, unspecified: Secondary | ICD-10-CM

## 2018-03-30 NOTE — Patient Instructions (Signed)
If you are age 71 or older, your body mass index should be between 23-30. Your Body mass index is 43.02 kg/m. If this is out of the aforementioned range listed, please consider follow up with your Primary Care Provider.  If you are age 664 or younger, your body mass index should be between 19-25. Your Body mass index is 43.02 kg/m. If this is out of the aformentioned range listed, please consider follow up with your Primary Care Provider.   It was a pleasure to meet you today!  Dr. Myrtie Neitheranis

## 2018-03-30 NOTE — Progress Notes (Signed)
[facility] Consult Note:  History: Nancy Hinton 03/30/2018  Referring physician: Lenell AntuLe, Thao P, DO;  Wyvonnia LoraGautam Kale, MD (Hematology)  Reason for consult/chief complaint: Anemia (Iron def., hx of infusions (last one inefeective))   Subjective  HPI:  Nancy Hinton is a pleasant 72 year old woman referred by hematology for evaluation of iron deficiency anemia.  She denies chronic abdominal pain, nausea, vomiting, early satiety, black or bloody stool or change in bowel habits.  She reports having had one or maybe 2 Cologuard tests in the last couple of years and was told they were normal.  This appears to have been done with primary care, as I could not find a result of that in this EMR. She had taken oral iron and then fallen out of regular primary care until establishing with her current provider.  She was found to have iron deficiency anemia and then referred to hematology.  She is now received 3 doses of IV iron with normalization of ferritin and hemoglobin.  She reports that Dr.Kale is planning to see her again in October to recheck blood counts.  ROS:  Review of Systems  She has chronic arthritic pain, and denies chest pain dyspnea or dysuria. Past Medical History: Past Medical History:  Diagnosis Date  . Arthritis   . Diabetes mellitus without complication (HCC)    history of DM prior to gastric bypass, she has not had to be on medicine since 170 lb weightloss.  . Hypertension    Gastric bypass in [place] 5 or 6 years ago  Past Surgical History: Past Surgical History:  Procedure Laterality Date  . COLON RESECTION    . GASTRIC BYPASS    . TONSILLECTOMY       Family History: Family History  Problem Relation Age of Onset  . Alzheimer's disease Mother   . Heart disease Father   . Heart attack Maternal Grandmother   . Breast cancer Maternal Grandmother     Social History: Social History   Socioeconomic History  . Marital status: Divorced   Spouse name: Not on file  . Number of children: Not on file  . Years of education: Not on file  . Highest education level: Not on file  Occupational History  . Not on file  Social Needs  . Financial resource strain: Not on file  . Food insecurity:    Worry: Not on file    Inability: Not on file  . Transportation needs:    Medical: Not on file    Non-medical: Not on file  Tobacco Use  . Smoking status: Never Smoker  . Smokeless tobacco: Never Used  Substance and Sexual Activity  . Alcohol use: No  . Drug use: No  . Sexual activity: Never  Lifestyle  . Physical activity:    Days per week: Not on file    Minutes per session: Not on file  . Stress: Not on file  Relationships  . Social connections:    Talks on phone: Not on file    Gets together: Not on file    Attends religious service: Not on file    Active member of club or organization: Not on file    Attends meetings of clubs or organizations: Not on file    Relationship status: Not on file  Other Topics Concern  . Not on file  Social History Narrative  . Not on file    Allergies: Allergies  Allergen Reactions  . Codeine Nausea Only    Outpatient  Meds: Current Outpatient Medications  Medication Sig Dispense Refill  . clonazePAM (KLONOPIN) 1 MG tablet TAKE 1 TABLET BY MOUTH EVERY NIGHT AT BEDTIME AS NEEDED FOR ANXIETY 30 tablet 0  . furosemide (LASIX) 20 MG tablet Take 20 mg by mouth.    Marland Kitchen glipiZIDE (GLUCOTROL XL) 5 MG 24 hr tablet Take 2 tablets by mouth daily.  1  . levothyroxine (SYNTHROID, LEVOTHROID) 88 MCG tablet TAKE 1 TABLET BY MOUTH EVERY DAY BEFORE BREAKFAST 30 tablet 0  . lisinopril (PRINIVIL,ZESTRIL) 20 MG tablet Take 1 tablet (20 mg total) by mouth daily. 90 tablet 3  . Multiple Vitamin (MULITIVITAMIN WITH MINERALS) TABS Take 1 tablet by mouth daily. Reported on 12/20/2015    . tiZANidine (ZANAFLEX) 2 MG tablet Take 1 tablet by mouth daily.  0  . Vitamin D, Ergocalciferol, (DRISDOL) 50000 units CAPS  capsule TAKE 1 CAPSULE BY MOUTH EVERY TUESDAY 2 capsule 0   No current facility-administered medications for this visit.       ___________________________________________________________________ Objective   Exam:  BP 134/82 (Cuff Size: Large)   Pulse 72   Ht 5\' 4"  (1.626 m)   Wt 250 lb 9.6 oz (113.7 kg)   BMI 43.02 kg/m      Eyes: sclera anicteric, no redness  ENT: oral mucosa moist without lesions, no cervical or supraclavicular lymphadenopathy, good dentition  CV: RRR without murmur, S1/S2, no JVD, no peripheral edema  Resp: clear to auscultation bilaterally, normal RR and effort noted  GI: soft, no tenderness, with active bowel sounds.  No appreciable hepatosplenomegaly, exam limited by body habitus.  Skin; warm and dry, no rash or jaundice noted  Neuro: awake, alert and oriented x 3. Normal gross motor function and fluent speech Antalgic gait Labs:  CBC Latest Ref Rng & Units 03/16/2018 02/04/2018 10/03/2015  WBC 3.9 - 10.3 K/uL 6.6 6.1 6.5  Hemoglobin 11.6 - 15.9 g/dL 16.1 0.9(U) 04.5  Hematocrit 34.8 - 46.6 % 39.0 32.5(L) 38.5  Platelets 145 - 400 K/uL 311 292 336   02/04/18:  Iron 13, TIBC 464, Ferritin 6  03/16/18: iron 36, TIBC 340 Ferritin 39  Assessment: Encounter Diagnoses  Name Primary?  . Iron deficiency anemia, unspecified iron deficiency anemia type Yes  . History of gastric bypass     Her iron deficiency is from iron malabsorption after gastric bypass surgery.  She has no overt GI bleeding or localizing GI symptoms to suggest a GI source of blood loss. She is reportedly up-to-date on colon cancer screening with negative Cologuard.  Plan:  She does not appear to need endoscopic work-up for this iron deficiency anemia, as we have an explanation, and iron and hemoglobin are now normalized after appropriate iron replacement.  She will follow with hematology and received periodic IV iron and see me as needed.  Thank you for the courtesy of this  consult.  Please call me with any questions or concerns.  [name]  CC: Lenell Antu, DO  Wyvonnia Lora, MD

## 2018-04-01 ENCOUNTER — Ambulatory Visit: Payer: Medicare HMO | Admitting: Gastroenterology

## 2018-04-01 DIAGNOSIS — M810 Age-related osteoporosis without current pathological fracture: Secondary | ICD-10-CM | POA: Diagnosis not present

## 2018-07-15 ENCOUNTER — Inpatient Hospital Stay: Payer: Medicare HMO

## 2018-07-15 ENCOUNTER — Inpatient Hospital Stay: Payer: Medicare HMO | Attending: Hematology and Oncology | Admitting: Hematology

## 2018-08-16 DIAGNOSIS — H40033 Anatomical narrow angle, bilateral: Secondary | ICD-10-CM | POA: Diagnosis not present

## 2018-08-16 DIAGNOSIS — E119 Type 2 diabetes mellitus without complications: Secondary | ICD-10-CM | POA: Diagnosis not present

## 2018-08-17 DIAGNOSIS — E119 Type 2 diabetes mellitus without complications: Secondary | ICD-10-CM | POA: Diagnosis not present

## 2018-08-17 DIAGNOSIS — Z23 Encounter for immunization: Secondary | ICD-10-CM | POA: Diagnosis not present

## 2018-08-17 DIAGNOSIS — Z0000 Encounter for general adult medical examination without abnormal findings: Secondary | ICD-10-CM | POA: Diagnosis not present

## 2018-08-17 DIAGNOSIS — K909 Intestinal malabsorption, unspecified: Secondary | ICD-10-CM | POA: Diagnosis not present

## 2018-08-17 DIAGNOSIS — M810 Age-related osteoporosis without current pathological fracture: Secondary | ICD-10-CM | POA: Diagnosis not present

## 2018-08-17 DIAGNOSIS — Z136 Encounter for screening for cardiovascular disorders: Secondary | ICD-10-CM | POA: Diagnosis not present

## 2018-08-17 DIAGNOSIS — I10 Essential (primary) hypertension: Secondary | ICD-10-CM | POA: Diagnosis not present

## 2018-08-17 DIAGNOSIS — D649 Anemia, unspecified: Secondary | ICD-10-CM | POA: Diagnosis not present

## 2018-08-17 DIAGNOSIS — E559 Vitamin D deficiency, unspecified: Secondary | ICD-10-CM | POA: Diagnosis not present

## 2018-08-23 ENCOUNTER — Other Ambulatory Visit: Payer: Self-pay

## 2018-08-23 DIAGNOSIS — R609 Edema, unspecified: Secondary | ICD-10-CM

## 2018-10-13 ENCOUNTER — Telehealth (HOSPITAL_COMMUNITY): Payer: Self-pay | Admitting: *Deleted

## 2018-10-13 NOTE — Telephone Encounter (Signed)
1/20 left msg stating I will call back to confirm appts

## 2018-10-14 ENCOUNTER — Encounter: Payer: Medicare HMO | Admitting: Vascular Surgery

## 2018-10-14 ENCOUNTER — Encounter (HOSPITAL_COMMUNITY): Payer: Medicare HMO

## 2018-10-21 DIAGNOSIS — Z9884 Bariatric surgery status: Secondary | ICD-10-CM | POA: Diagnosis not present

## 2018-10-21 DIAGNOSIS — I10 Essential (primary) hypertension: Secondary | ICD-10-CM | POA: Diagnosis not present

## 2018-11-04 DIAGNOSIS — Z6841 Body Mass Index (BMI) 40.0 and over, adult: Secondary | ICD-10-CM | POA: Diagnosis not present

## 2018-11-04 DIAGNOSIS — Z9884 Bariatric surgery status: Secondary | ICD-10-CM | POA: Diagnosis not present

## 2018-11-04 DIAGNOSIS — E119 Type 2 diabetes mellitus without complications: Secondary | ICD-10-CM | POA: Diagnosis not present

## 2018-11-04 DIAGNOSIS — E038 Other specified hypothyroidism: Secondary | ICD-10-CM | POA: Diagnosis not present

## 2018-11-11 DIAGNOSIS — Z7189 Other specified counseling: Secondary | ICD-10-CM | POA: Diagnosis not present

## 2018-11-11 DIAGNOSIS — Z713 Dietary counseling and surveillance: Secondary | ICD-10-CM | POA: Diagnosis not present

## 2018-11-11 DIAGNOSIS — F54 Psychological and behavioral factors associated with disorders or diseases classified elsewhere: Secondary | ICD-10-CM | POA: Diagnosis not present

## 2018-11-11 DIAGNOSIS — F39 Unspecified mood [affective] disorder: Secondary | ICD-10-CM | POA: Diagnosis not present

## 2018-11-11 DIAGNOSIS — Z6841 Body Mass Index (BMI) 40.0 and over, adult: Secondary | ICD-10-CM | POA: Diagnosis not present

## 2018-12-12 DIAGNOSIS — Z7189 Other specified counseling: Secondary | ICD-10-CM | POA: Diagnosis not present

## 2018-12-12 DIAGNOSIS — F54 Psychological and behavioral factors associated with disorders or diseases classified elsewhere: Secondary | ICD-10-CM | POA: Diagnosis not present

## 2018-12-12 DIAGNOSIS — F411 Generalized anxiety disorder: Secondary | ICD-10-CM | POA: Diagnosis not present

## 2018-12-12 DIAGNOSIS — Z6841 Body Mass Index (BMI) 40.0 and over, adult: Secondary | ICD-10-CM | POA: Diagnosis not present

## 2018-12-16 DIAGNOSIS — E119 Type 2 diabetes mellitus without complications: Secondary | ICD-10-CM | POA: Diagnosis not present

## 2018-12-16 DIAGNOSIS — Z9884 Bariatric surgery status: Secondary | ICD-10-CM | POA: Diagnosis not present

## 2018-12-16 DIAGNOSIS — E039 Hypothyroidism, unspecified: Secondary | ICD-10-CM | POA: Diagnosis not present

## 2018-12-16 DIAGNOSIS — I10 Essential (primary) hypertension: Secondary | ICD-10-CM | POA: Diagnosis not present

## 2018-12-16 DIAGNOSIS — Z6841 Body Mass Index (BMI) 40.0 and over, adult: Secondary | ICD-10-CM | POA: Diagnosis not present

## 2018-12-22 DIAGNOSIS — E559 Vitamin D deficiency, unspecified: Secondary | ICD-10-CM | POA: Diagnosis not present

## 2018-12-22 DIAGNOSIS — F4323 Adjustment disorder with mixed anxiety and depressed mood: Secondary | ICD-10-CM | POA: Diagnosis not present

## 2018-12-22 DIAGNOSIS — M1990 Unspecified osteoarthritis, unspecified site: Secondary | ICD-10-CM | POA: Diagnosis not present

## 2018-12-22 DIAGNOSIS — I10 Essential (primary) hypertension: Secondary | ICD-10-CM | POA: Diagnosis not present

## 2018-12-22 DIAGNOSIS — Z7984 Long term (current) use of oral hypoglycemic drugs: Secondary | ICD-10-CM | POA: Diagnosis not present

## 2018-12-22 DIAGNOSIS — E119 Type 2 diabetes mellitus without complications: Secondary | ICD-10-CM | POA: Diagnosis not present

## 2019-03-13 DIAGNOSIS — Z7189 Other specified counseling: Secondary | ICD-10-CM | POA: Diagnosis not present

## 2019-03-13 DIAGNOSIS — Z6841 Body Mass Index (BMI) 40.0 and over, adult: Secondary | ICD-10-CM | POA: Diagnosis not present

## 2019-03-13 DIAGNOSIS — F39 Unspecified mood [affective] disorder: Secondary | ICD-10-CM | POA: Diagnosis not present

## 2019-03-13 DIAGNOSIS — F54 Psychological and behavioral factors associated with disorders or diseases classified elsewhere: Secondary | ICD-10-CM | POA: Diagnosis not present

## 2019-04-03 DIAGNOSIS — Z6841 Body Mass Index (BMI) 40.0 and over, adult: Secondary | ICD-10-CM | POA: Diagnosis not present

## 2019-04-03 DIAGNOSIS — F54 Psychological and behavioral factors associated with disorders or diseases classified elsewhere: Secondary | ICD-10-CM | POA: Diagnosis not present

## 2019-04-03 DIAGNOSIS — F39 Unspecified mood [affective] disorder: Secondary | ICD-10-CM | POA: Diagnosis not present

## 2019-04-04 DIAGNOSIS — I10 Essential (primary) hypertension: Secondary | ICD-10-CM | POA: Diagnosis not present

## 2019-04-04 DIAGNOSIS — Z9884 Bariatric surgery status: Secondary | ICD-10-CM | POA: Diagnosis not present

## 2019-04-04 DIAGNOSIS — E119 Type 2 diabetes mellitus without complications: Secondary | ICD-10-CM | POA: Diagnosis not present

## 2019-04-04 DIAGNOSIS — Z6841 Body Mass Index (BMI) 40.0 and over, adult: Secondary | ICD-10-CM | POA: Diagnosis not present

## 2019-04-04 DIAGNOSIS — E038 Other specified hypothyroidism: Secondary | ICD-10-CM | POA: Diagnosis not present

## 2019-05-04 DIAGNOSIS — K909 Intestinal malabsorption, unspecified: Secondary | ICD-10-CM | POA: Diagnosis not present

## 2019-05-04 DIAGNOSIS — I10 Essential (primary) hypertension: Secondary | ICD-10-CM | POA: Diagnosis not present

## 2019-05-04 DIAGNOSIS — Z9884 Bariatric surgery status: Secondary | ICD-10-CM | POA: Diagnosis not present

## 2019-07-21 DIAGNOSIS — E119 Type 2 diabetes mellitus without complications: Secondary | ICD-10-CM | POA: Diagnosis not present

## 2019-07-21 DIAGNOSIS — I10 Essential (primary) hypertension: Secondary | ICD-10-CM | POA: Diagnosis not present

## 2019-07-21 DIAGNOSIS — Z23 Encounter for immunization: Secondary | ICD-10-CM | POA: Diagnosis not present

## 2019-11-16 DIAGNOSIS — E662 Morbid (severe) obesity with alveolar hypoventilation: Secondary | ICD-10-CM | POA: Diagnosis not present

## 2019-11-16 DIAGNOSIS — F0390 Unspecified dementia without behavioral disturbance: Secondary | ICD-10-CM | POA: Diagnosis not present

## 2019-11-16 DIAGNOSIS — I10 Essential (primary) hypertension: Secondary | ICD-10-CM | POA: Diagnosis not present

## 2019-11-16 DIAGNOSIS — F330 Major depressive disorder, recurrent, mild: Secondary | ICD-10-CM | POA: Diagnosis not present

## 2019-11-16 DIAGNOSIS — R5383 Other fatigue: Secondary | ICD-10-CM | POA: Diagnosis not present

## 2019-11-16 DIAGNOSIS — D692 Other nonthrombocytopenic purpura: Secondary | ICD-10-CM | POA: Diagnosis not present

## 2019-11-16 DIAGNOSIS — E038 Other specified hypothyroidism: Secondary | ICD-10-CM | POA: Diagnosis not present

## 2019-11-16 DIAGNOSIS — E559 Vitamin D deficiency, unspecified: Secondary | ICD-10-CM | POA: Diagnosis not present

## 2019-11-16 DIAGNOSIS — F418 Other specified anxiety disorders: Secondary | ICD-10-CM | POA: Diagnosis not present

## 2019-11-16 DIAGNOSIS — E1169 Type 2 diabetes mellitus with other specified complication: Secondary | ICD-10-CM | POA: Diagnosis not present

## 2019-11-16 DIAGNOSIS — Z1211 Encounter for screening for malignant neoplasm of colon: Secondary | ICD-10-CM | POA: Diagnosis not present

## 2019-11-16 DIAGNOSIS — G20 Parkinson's disease: Secondary | ICD-10-CM | POA: Diagnosis not present

## 2019-11-16 DIAGNOSIS — Z6841 Body Mass Index (BMI) 40.0 and over, adult: Secondary | ICD-10-CM | POA: Diagnosis not present

## 2019-11-17 ENCOUNTER — Other Ambulatory Visit: Payer: Self-pay | Admitting: Vascular Surgery

## 2019-11-17 DIAGNOSIS — R609 Edema, unspecified: Secondary | ICD-10-CM

## 2020-03-28 DIAGNOSIS — M1990 Unspecified osteoarthritis, unspecified site: Secondary | ICD-10-CM | POA: Diagnosis not present

## 2020-03-28 DIAGNOSIS — E038 Other specified hypothyroidism: Secondary | ICD-10-CM | POA: Diagnosis not present

## 2020-03-28 DIAGNOSIS — E785 Hyperlipidemia, unspecified: Secondary | ICD-10-CM | POA: Diagnosis not present

## 2020-03-28 DIAGNOSIS — I10 Essential (primary) hypertension: Secondary | ICD-10-CM | POA: Diagnosis not present

## 2020-03-28 DIAGNOSIS — E119 Type 2 diabetes mellitus without complications: Secondary | ICD-10-CM | POA: Diagnosis not present

## 2020-03-28 DIAGNOSIS — E039 Hypothyroidism, unspecified: Secondary | ICD-10-CM | POA: Diagnosis not present

## 2020-03-28 DIAGNOSIS — E1169 Type 2 diabetes mellitus with other specified complication: Secondary | ICD-10-CM | POA: Diagnosis not present

## 2020-03-28 DIAGNOSIS — D509 Iron deficiency anemia, unspecified: Secondary | ICD-10-CM | POA: Diagnosis not present

## 2020-03-28 DIAGNOSIS — M810 Age-related osteoporosis without current pathological fracture: Secondary | ICD-10-CM | POA: Diagnosis not present

## 2020-05-11 DIAGNOSIS — E038 Other specified hypothyroidism: Secondary | ICD-10-CM | POA: Diagnosis not present

## 2020-05-11 DIAGNOSIS — M1990 Unspecified osteoarthritis, unspecified site: Secondary | ICD-10-CM | POA: Diagnosis not present

## 2020-05-11 DIAGNOSIS — E119 Type 2 diabetes mellitus without complications: Secondary | ICD-10-CM | POA: Diagnosis not present

## 2020-05-11 DIAGNOSIS — E1169 Type 2 diabetes mellitus with other specified complication: Secondary | ICD-10-CM | POA: Diagnosis not present

## 2020-05-11 DIAGNOSIS — M810 Age-related osteoporosis without current pathological fracture: Secondary | ICD-10-CM | POA: Diagnosis not present

## 2020-05-11 DIAGNOSIS — E785 Hyperlipidemia, unspecified: Secondary | ICD-10-CM | POA: Diagnosis not present

## 2020-05-11 DIAGNOSIS — D509 Iron deficiency anemia, unspecified: Secondary | ICD-10-CM | POA: Diagnosis not present

## 2020-05-11 DIAGNOSIS — I10 Essential (primary) hypertension: Secondary | ICD-10-CM | POA: Diagnosis not present

## 2020-05-11 DIAGNOSIS — E039 Hypothyroidism, unspecified: Secondary | ICD-10-CM | POA: Diagnosis not present

## 2020-06-10 DIAGNOSIS — E039 Hypothyroidism, unspecified: Secondary | ICD-10-CM | POA: Diagnosis not present

## 2020-06-10 DIAGNOSIS — M810 Age-related osteoporosis without current pathological fracture: Secondary | ICD-10-CM | POA: Diagnosis not present

## 2020-06-10 DIAGNOSIS — M1990 Unspecified osteoarthritis, unspecified site: Secondary | ICD-10-CM | POA: Diagnosis not present

## 2020-06-10 DIAGNOSIS — E038 Other specified hypothyroidism: Secondary | ICD-10-CM | POA: Diagnosis not present

## 2020-06-10 DIAGNOSIS — E119 Type 2 diabetes mellitus without complications: Secondary | ICD-10-CM | POA: Diagnosis not present

## 2020-06-10 DIAGNOSIS — E785 Hyperlipidemia, unspecified: Secondary | ICD-10-CM | POA: Diagnosis not present

## 2020-06-10 DIAGNOSIS — I10 Essential (primary) hypertension: Secondary | ICD-10-CM | POA: Diagnosis not present

## 2020-06-10 DIAGNOSIS — E1169 Type 2 diabetes mellitus with other specified complication: Secondary | ICD-10-CM | POA: Diagnosis not present

## 2020-06-10 DIAGNOSIS — D509 Iron deficiency anemia, unspecified: Secondary | ICD-10-CM | POA: Diagnosis not present

## 2020-07-10 DIAGNOSIS — E1169 Type 2 diabetes mellitus with other specified complication: Secondary | ICD-10-CM | POA: Diagnosis not present

## 2020-07-10 DIAGNOSIS — M810 Age-related osteoporosis without current pathological fracture: Secondary | ICD-10-CM | POA: Diagnosis not present

## 2020-07-10 DIAGNOSIS — E038 Other specified hypothyroidism: Secondary | ICD-10-CM | POA: Diagnosis not present

## 2020-07-10 DIAGNOSIS — E785 Hyperlipidemia, unspecified: Secondary | ICD-10-CM | POA: Diagnosis not present

## 2020-07-10 DIAGNOSIS — D509 Iron deficiency anemia, unspecified: Secondary | ICD-10-CM | POA: Diagnosis not present

## 2020-07-10 DIAGNOSIS — I10 Essential (primary) hypertension: Secondary | ICD-10-CM | POA: Diagnosis not present

## 2020-07-10 DIAGNOSIS — M1990 Unspecified osteoarthritis, unspecified site: Secondary | ICD-10-CM | POA: Diagnosis not present

## 2020-07-10 DIAGNOSIS — E119 Type 2 diabetes mellitus without complications: Secondary | ICD-10-CM | POA: Diagnosis not present

## 2020-07-10 DIAGNOSIS — E039 Hypothyroidism, unspecified: Secondary | ICD-10-CM | POA: Diagnosis not present

## 2021-01-03 DIAGNOSIS — J069 Acute upper respiratory infection, unspecified: Secondary | ICD-10-CM | POA: Diagnosis not present

## 2021-03-06 DIAGNOSIS — E119 Type 2 diabetes mellitus without complications: Secondary | ICD-10-CM | POA: Diagnosis not present

## 2021-03-06 DIAGNOSIS — E039 Hypothyroidism, unspecified: Secondary | ICD-10-CM | POA: Diagnosis not present

## 2021-03-06 DIAGNOSIS — M1990 Unspecified osteoarthritis, unspecified site: Secondary | ICD-10-CM | POA: Diagnosis not present

## 2021-03-06 DIAGNOSIS — E1169 Type 2 diabetes mellitus with other specified complication: Secondary | ICD-10-CM | POA: Diagnosis not present

## 2021-03-06 DIAGNOSIS — E785 Hyperlipidemia, unspecified: Secondary | ICD-10-CM | POA: Diagnosis not present

## 2021-03-06 DIAGNOSIS — M810 Age-related osteoporosis without current pathological fracture: Secondary | ICD-10-CM | POA: Diagnosis not present

## 2021-03-06 DIAGNOSIS — D509 Iron deficiency anemia, unspecified: Secondary | ICD-10-CM | POA: Diagnosis not present

## 2021-03-06 DIAGNOSIS — Z6841 Body Mass Index (BMI) 40.0 and over, adult: Secondary | ICD-10-CM | POA: Diagnosis not present

## 2021-03-06 DIAGNOSIS — Z7984 Long term (current) use of oral hypoglycemic drugs: Secondary | ICD-10-CM | POA: Diagnosis not present

## 2021-03-06 DIAGNOSIS — E662 Morbid (severe) obesity with alveolar hypoventilation: Secondary | ICD-10-CM | POA: Diagnosis not present

## 2021-03-06 DIAGNOSIS — F418 Other specified anxiety disorders: Secondary | ICD-10-CM | POA: Diagnosis not present

## 2021-03-06 DIAGNOSIS — I10 Essential (primary) hypertension: Secondary | ICD-10-CM | POA: Diagnosis not present

## 2021-03-25 ENCOUNTER — Telehealth: Payer: Self-pay | Admitting: Hematology

## 2021-03-25 NOTE — Telephone Encounter (Signed)
Scheduled appt per 6/1 referral. Pt aware.

## 2021-04-01 ENCOUNTER — Telehealth: Payer: Self-pay | Admitting: Hematology

## 2021-04-01 NOTE — Telephone Encounter (Signed)
Cancelled appts per 6/21 sch msg. Pt said she would call back at a later date and r/s.

## 2021-04-08 DIAGNOSIS — F418 Other specified anxiety disorders: Secondary | ICD-10-CM | POA: Diagnosis not present

## 2021-04-08 DIAGNOSIS — E1169 Type 2 diabetes mellitus with other specified complication: Secondary | ICD-10-CM | POA: Diagnosis not present

## 2021-04-08 DIAGNOSIS — S8012XA Contusion of left lower leg, initial encounter: Secondary | ICD-10-CM | POA: Diagnosis not present

## 2021-04-08 DIAGNOSIS — I4510 Unspecified right bundle-branch block: Secondary | ICD-10-CM | POA: Diagnosis not present

## 2021-04-08 DIAGNOSIS — R55 Syncope and collapse: Secondary | ICD-10-CM | POA: Diagnosis not present

## 2021-04-08 DIAGNOSIS — W19XXXA Unspecified fall, initial encounter: Secondary | ICD-10-CM | POA: Diagnosis not present

## 2021-04-15 ENCOUNTER — Other Ambulatory Visit: Payer: Medicare HMO

## 2021-04-15 ENCOUNTER — Ambulatory Visit: Payer: Medicare HMO | Admitting: Hematology

## 2021-05-20 DIAGNOSIS — F418 Other specified anxiety disorders: Secondary | ICD-10-CM | POA: Diagnosis not present

## 2021-06-19 DIAGNOSIS — Z20822 Contact with and (suspected) exposure to covid-19: Secondary | ICD-10-CM | POA: Diagnosis not present

## 2021-06-19 NOTE — Progress Notes (Signed)
Cardiology Office Note:    Date:  06/20/2021   ID:  Nancy Hinton, DOB Apr 02, 1946, MRN [id number]  PCP:  Lenell Antu, DO  Cardiologist:  None   Referring MD: Sheliah Hatch, PA-C   Chief Complaint  Patient presents with   Atrial Fibrillation   Irregular Heart Beat     History of Present Illness:    Nancy Hinton is a 75 y.o. female with a hx of DM II, morbid obesity, primary hypertension, RBBB, anxiety disorder, and recent syncope referred for evaluation.  10 weeks ago she had onset of weakness, diaphoresis, near syncope, and after 5 minutes had an actual syncopal episode.  2 grandchildren were home with her.  The older ones states that she did not wake up for up to a minute.  They called their father who came.  She did not go to the emergency room.  2 days later she went to the primary care physician's office who referred her for cardiology evaluation.  That office visit occurred on 04/08/2021.  I do not find an EKG available but the physician notes that right bundle branch block was present and that was the reason for evaluation given the history of syncope.  Today the patient states she has not had a similar recurrent episode.  She has occasional palpitation.  She is not complaining of any today.  Randomly, today's EKG demonstrated sinus rhythm and then development of paroxysmal atrial fibrillation at rates greater than 125 bpm.  She remained asymptomatic.  Since the syncopal episode she has noticed left greater than right lower extremity swelling.  The lower extremities tend to have some swelling over time but it was not asymmetrical.  She did injure her left leg with a syncopal episode and also had head trauma with black eye.  Past Medical History:  Diagnosis Date   Arthritis    Diabetes mellitus without complication (HCC)    history of DM prior to gastric bypass, she has not had to be on medicine since 170 lb weightloss.   Hypertension     Past Surgical  History:  Procedure Laterality Date   COLON RESECTION     GASTRIC BYPASS     TONSILLECTOMY      Current Medications: Current Meds  Medication Sig   apixaban (ELIQUIS) 5 MG TABS tablet Take 1 tablet (5 mg total) by mouth 2 (two) times daily.   clonazePAM (KLONOPIN) 1 MG tablet TAKE 1 TABLET BY MOUTH EVERY NIGHT AT BEDTIME AS NEEDED FOR ANXIETY   furosemide (LASIX) 20 MG tablet Take 20 mg by mouth.   glipiZIDE (GLUCOTROL XL) 10 MG 24 hr tablet Take 10 mg by mouth daily with breakfast.   levothyroxine (SYNTHROID, LEVOTHROID) 88 MCG tablet TAKE 1 TABLET BY MOUTH EVERY DAY BEFORE BREAKFAST   lisinopril (PRINIVIL,ZESTRIL) 20 MG tablet Take 1 tablet (20 mg total) by mouth daily.   metoprolol succinate (TOPROL XL) 25 MG 24 hr tablet Take 1 tablet (25 mg total) by mouth daily.   Multiple Vitamin (MULITIVITAMIN WITH MINERALS) TABS Take 1 tablet by mouth daily. Reported on 12/20/2015   simvastatin (ZOCOR) 10 MG tablet Take 10 mg by mouth daily.   tiZANidine (ZANAFLEX) 2 MG tablet Take 1 tablet by mouth daily.   topiramate (TOPAMAX) 50 MG tablet Take 60 mg by mouth daily.   Vitamin D, Ergocalciferol, (DRISDOL) 50000 units CAPS capsule TAKE 1 CAPSULE BY MOUTH EVERY TUESDAY     Allergies:   Codeine   Social History  Socioeconomic History   Marital status: Divorced    Spouse name: Not on file   Number of children: Not on file   Years of education: Not on file   Highest education level: Not on file  Occupational History   Not on file  Tobacco Use   Smoking status: Never   Smokeless tobacco: Never  Substance and Sexual Activity   Alcohol use: No   Drug use: No   Sexual activity: Never  Other Topics Concern   Not on file  Social History Narrative   Not on file   Social Determinants of Health   Financial Resource Strain: Not on file  Food Insecurity: Not on file  Transportation Needs: Not on file  Physical Activity: Not on file  Stress: Not on file  Social Connections: Not on file      Family History: The patient's family history includes Alzheimer's disease in her mother; Breast cancer in her maternal grandmother; Heart attack in her maternal grandmother; Heart disease in her father.  ROS:   Please see the history of present illness.    Does not sleep well.  Has nocturia.  States that her grandchildren do not mention that she snores.  Does have excessive daytime sleepiness.  All other systems reviewed and are negative.  EKGs/Labs/Other Studies Reviewed:    The following studies were reviewed today: No prior cardiac studies.  EKG:  EKG normal sinus rhythm with paroxysmal atrial fibrillation with RVR.  Right bundle with left anterior hemiblock.  Recent Labs: No results found for requested labs within last 8760 hours.  Recent Lipid Panel    Component Value Date/Time   CHOL 167 09/07/2014 0929   TRIG 70 09/07/2014 0929   HDL 57 09/07/2014 0929   CHOLHDL 2.9 09/07/2014 0929   VLDL 14 09/07/2014 0929   LDLCALC 96 09/07/2014 0929    Physical Exam:    VS:  BP 114/80   Pulse (!) 125   Ht 5\' 4"  (1.626 m)   Wt 229 lb 12.8 oz (104.2 kg)   SpO2 99%   BMI 39.45 kg/m     Wt Readings from Last 3 Encounters:  06/20/21 229 lb 12.8 oz (104.2 kg)  03/30/18 250 lb 9.6 oz (113.7 kg)  03/18/18 246 lb 11.2 oz (111.9 kg)     GEN: Obese. No acute distress HEENT: Normal NECK: No JVD. LYMPHATICS: No lymphadenopathy CARDIAC: No murmur. IIRR which is intermittent.  No gallop, or edema. VASCULAR:  Normal Pulses. No bruits. RESPIRATORY:  Clear to auscultation without rales, wheezing or rhonchi  ABDOMEN: Soft, non-tender, non-distended, No pulsatile mass, MUSCULOSKELETAL: No deformity  SKIN: Warm and dry NEUROLOGIC:  Alert and oriented x 3 PSYCHIATRIC:  Normal affect   ASSESSMENT:    1. Syncope and collapse   2. Paroxysmal atrial fibrillation (HCC)   3. RBBB   4. Primary hypertension   5. Controlled type 2 diabetes mellitus with other circulatory complication,  without long-term current use of insulin (HCC)   6. Morbid obesity (HCC)   7. Leg edema, left    PLAN:    In order of problems listed above:  Rule out tachycardia-bradycardia syndrome.  4-week monitor. Identified serendipitously on today's EKG.  Start Toprol-XL 25 mg/day.  Start Eliquis 5 mg twice daily.  Comprehensive metabolic panel, CBC, and BNP will be obtained.  2D Doppler echocardiogram will be performed.  The Chads Vasc is greater than 4. Bifascicular block with right bundle and left anterior hemiblock as noted above.  Rule  out transient high-grade AV block as etiology of syncope. Relatively well controlled.  When repeated 128/90 mmHg.  Add Toprol-XL 25 mg/day. Hemoglobin A1c 7.2 in May. Morbid obesity.  Need to exclude sleep apnea as possible etiology of paroxysmal A. fib. Left greater than right lower extremity swelling since syncopal episode.  Rule out DVT.  Bilateral lower extremity venous Doppler study   Blood work as noted, 2D Doppler echocardiogram, 30-day monitor, bilateral lower extremity Doppler study, metoprolol succinate 25 mg/day, 4-week follow-up.   Medication Adjustments/Labs and Tests Ordered: Current medicines are reviewed at length with the patient today.  Concerns regarding medicines are outlined above.  Orders Placed This Encounter  Procedures   Basic metabolic panel   CBC   Pro b natriuretic peptide   Hepatic function panel   Cardiac event monitor   EKG 12-Lead   ECHOCARDIOGRAM COMPLETE   VAS [place] LOWER EXTREMITY VENOUS (DVT)    Meds ordered this encounter  Medications   apixaban (ELIQUIS) 5 MG TABS tablet    Sig: Take 1 tablet (5 mg total) by mouth 2 (two) times daily.    Dispense:  60 tablet    Refill:  11   metoprolol succinate (TOPROL XL) 25 MG 24 hr tablet    Sig: Take 1 tablet (25 mg total) by mouth daily.    Dispense:  90 tablet    Refill:  3     Patient Instructions  Medication Instructions:  1) START Eliquis 5mg  twice daily 2) START  Metoprolol Succinate 25mg  once daily 3) DISCONTINUE Furosemide  *If you need a refill on your cardiac medications before your next appointment, please call your pharmacy*   Lab Work: BMET, CBC, Liver and Pro BNP today  If you have labs (blood work) drawn today and your tests are completely normal, you will receive your results only by: MyChart Message (if you have MyChart) OR A paper copy in the mail If you have any lab test that is abnormal or we need to change your treatment, we will call you to review the results.   Testing/Procedures: Your physician has requested that you have an echocardiogram. Echocardiography is a painless test that uses sound waves to create images of your heart. It provides your doctor with information about the size and shape of your heart and how well your heart's chambers and valves are working. This procedure takes approximately one hour. There are no restrictions for this procedure.  Your physician has requested that you have a lower or upper extremity venous duplex. This test is an ultrasound of the veins in the legs or arms. It looks at venous blood flow that carries blood from the heart to the legs or arms. Allow one hour for a Lower Venous exam. Allow thirty minutes for an Upper Venous exam. There are no restrictions or special instructions.  Your physician has recommended that you wear an event monitor. Event monitors are medical devices that record the heart's electrical activity. Doctors most often these monitors to diagnose arrhythmias. Arrhythmias are problems with the speed or rhythm of the heartbeat. The monitor is a small, portable device. You can wear one while you do your normal daily activities. This is usually used to diagnose what is causing palpitations/syncope (passing out).    Follow-Up: At [facility], you and your health needs are our priority.  As part of our continuing mission to provide you with exceptional heart care, we have  created designated Provider Care Teams.  These Care Teams include  your primary Cardiologist (physician) and Advanced Practice Providers (APPs -  Physician Assistants and Nurse Practitioners) who all work together to provide you with the care you need, when you need it.  We recommend signing up for the patient portal called "MyChart".  Sign up information is provided on this After Visit Summary.  MyChart is used to connect with patients for Virtual Visits (Telemedicine).  Patients are able to view lab/test results, encounter notes, upcoming appointments, etc.  Non-urgent messages can be sent to your provider as well.   To learn more about what you can do with MyChart, go to [email or web address].    Your next appointment:   4 week(s)  The format for your next appointment:   In Person  Provider:   You may see Verdis Prime, MD or one of the following Advanced Practice Providers on your designated Care Team:   Nada Boozer, NP   Other Instructions  [employer] Cardiac Event Monitor Instructions Your physician has requested you wear your cardiac event monitor for _____ days, (1-30). [employer] may call or text to confirm a shipping address. The monitor will be sent to a land address via UPS. [employer] will not ship a monitor to a PO BOX. It typically takes 3-5 days to receive your monitor after it has been enrolled. [employer] will assist with USPS tracking if your package is delayed. The telephone number for [employer] is [phone]. Once you have received your monitor, please review the enclosed instructions. Instruction tutorials can also be viewed under help and settings on the enclosed cell phone. Your monitor has already been registered assigning a specific monitor serial # to you.  Applying the monitor Remove cell phone from case and turn it on. The cell phone works as IT consultant and needs to be within [employer] of you at all times. The cell phone will need to be charged on a  daily basis. We recommend you plug the cell phone into the enclosed charger at your bedside table every night.  Monitor batteries: You will receive two monitor batteries labelled #1 and #2. These are your recorders. Plug battery #2 onto the second connection on the enclosed charger. Keep one battery on the charger at all times. This will keep the monitor battery deactivated. It will also keep it fully charged for when you need to switch your monitor batteries. A small light will be blinking on the battery emblem when it is charging. The light on the battery emblem will remain on when the battery is fully charged.  Open package of a Monitor strip. Insert battery #1 into black hood on strip and gently squeeze monitor battery onto connection as indicated in instruction booklet. Set aside while preparing skin.  Choose location for your strip, vertical or horizontal, as indicated in the instruction booklet. Shave to remove all hair from location. There cannot be any lotions, oils, powders, or colognes on skin where monitor is to be applied. Wipe skin clean with enclosed Saline wipe. Dry skin completely.  Peel paper labeled #1 off the back of the Monitor strip exposing the adhesive. Place the monitor on the chest in the vertical or horizontal position shown in the instruction booklet. One arrow on the monitor strip must be pointing upward. Carefully remove paper labeled #2, attaching remainder of strip to your skin. Try not to create any folds or wrinkles in the strip as you apply it.  Firmly press and release the circle in the center of the monitor battery. You will hear a  small beep. This is turning the monitor battery on. The heart emblem on the monitor battery will light up every 5 seconds if the monitor battery in turned on and connected to the patient securely. Do not push and hold the circle down as this turns the monitor battery off. The cell phone will locate the monitor battery. A screen  will appear on the cell phone checking the connection of your monitor strip. This may read poor connection initially but change to good connection within the next minute. Once your monitor accepts the connection you will hear a series of 3 beeps followed by a climbing crescendo of beeps. A screen will appear on the cell phone showing the two monitor strip placement options. Touch the picture that demonstrates where you applied the monitor strip.  Your monitor strip and battery are waterproof. You are able to shower, bathe, or swim with the monitor on. They just ask you do not submerge deeper than 3 feet underwater. We recommend removing the monitor if you are swimming in a lake, river, or ocean.  Your monitor battery will need to be switched to a fully charged monitor battery approximately once a week. The cell phone will alert you of an action which needs to be made.  On the cell phone, tap for details to reveal connection status, monitor battery status, and cell phone battery status. The green dots indicates your monitor is in good status. A red dot indicates there is something that needs your attention.  To record a symptom, click the circle on the monitor battery. In 30-60 seconds a list of symptoms will appear on the cell phone. Select your symptom and tap save. Your monitor will record a sustained or significant arrhythmia regardless of you clicking the button. Some patients do not feel the heart rhythm irregularities. [employer] will notify us of any serious or critical events.  Refer to instruction booklet for instructions on switching batteries, changing strips, the Do not disturb or Pause features, or any additional questions.  Call [employer] at [phone], to confirm your monitor is transmitting and record your baseline. They will answer any questions you may have regarding the monitor instructions at that time.  Returning the monitor to [employer] Place all equipment  back into blue box. Peel off strip of paper to expose adhesive and close box securely. There is a prepaid UPS shipping label on this box. Drop in a UPS drop box, or at a UPS facility like [employer]. You may also contact [employer] to arrange UPS to pick up monitor package at your home.     Signed, Lesleigh Noe, MD  06/20/2021 10:31 AM    [facility]

## 2021-06-20 ENCOUNTER — Ambulatory Visit: Payer: Medicare HMO | Admitting: Interventional Cardiology

## 2021-06-20 ENCOUNTER — Other Ambulatory Visit: Payer: Self-pay

## 2021-06-20 ENCOUNTER — Encounter: Payer: Self-pay | Admitting: Interventional Cardiology

## 2021-06-20 VITALS — BP 114/80 | HR 125 | Ht 64.0 in | Wt 229.8 lb

## 2021-06-20 DIAGNOSIS — I4510 Unspecified right bundle-branch block: Secondary | ICD-10-CM

## 2021-06-20 DIAGNOSIS — I10 Essential (primary) hypertension: Secondary | ICD-10-CM | POA: Diagnosis not present

## 2021-06-20 DIAGNOSIS — R55 Syncope and collapse: Secondary | ICD-10-CM | POA: Diagnosis not present

## 2021-06-20 DIAGNOSIS — E1159 Type 2 diabetes mellitus with other circulatory complications: Secondary | ICD-10-CM | POA: Diagnosis not present

## 2021-06-20 DIAGNOSIS — R600 Localized edema: Secondary | ICD-10-CM | POA: Diagnosis not present

## 2021-06-20 DIAGNOSIS — I480 Paroxysmal atrial fibrillation: Secondary | ICD-10-CM | POA: Diagnosis not present

## 2021-06-20 MED ORDER — APIXABAN 5 MG PO TABS: 5.0000 mg | ORAL_TABLET | Freq: Two times a day (BID) | ORAL | 11 refills | Status: DC

## 2021-06-20 MED ORDER — METOPROLOL SUCCINATE ER 25 MG PO TB24: 25.0000 mg | ORAL_TABLET | Freq: Every day | ORAL | 3 refills | Status: DC

## 2021-06-20 NOTE — Patient Instructions (Addendum)
Medication Instructions:  1) START Eliquis 5mg  twice daily 2) START Metoprolol Succinate 25mg  once daily 3) DISCONTINUE Furosemide  *If you need a refill on your cardiac medications before your next appointment, please call your pharmacy*   Lab Work: BMET, CBC, Liver and Pro BNP today  If you have labs (blood work) drawn today and your tests are completely normal, you will receive your results only by: MyChart Message (if you have MyChart) OR A paper copy in the mail If you have any lab test that is abnormal or we need to change your treatment, we will call you to review the results.   Testing/Procedures: Your physician has requested that you have an echocardiogram. Echocardiography is a painless test that uses sound waves to create images of your heart. It provides your doctor with information about the size and shape of your heart and how well your heart's chambers and valves are working. This procedure takes approximately one hour. There are no restrictions for this procedure.  Your physician has requested that you have a lower or upper extremity venous duplex. This test is an ultrasound of the veins in the legs or arms. It looks at venous blood flow that carries blood from the heart to the legs or arms. Allow one hour for a Lower Venous exam. Allow thirty minutes for an Upper Venous exam. There are no restrictions or special instructions.  Your physician has recommended that you wear an event monitor. Event monitors are medical devices that record the heart's electrical activity. Doctors most often these monitors to diagnose arrhythmias. Arrhythmias are problems with the speed or rhythm of the heartbeat. The monitor is a small, portable device. You can wear one while you do your normal daily activities. This is usually used to diagnose what is causing palpitations/syncope (passing out).    Follow-Up: At [facility], you and your health needs are our priority.  As part of our  continuing mission to provide you with exceptional heart care, we have created designated Provider Care Teams.  These Care Teams include your primary Cardiologist (physician) and Advanced Practice Providers (APPs -  Physician Assistants and Nurse Practitioners) who all work together to provide you with the care you need, when you need it.  We recommend signing up for the patient portal called "MyChart".  Sign up information is provided on this After Visit Summary.  MyChart is used to connect with patients for Virtual Visits (Telemedicine).  Patients are able to view lab/test results, encounter notes, upcoming appointments, etc.  Non-urgent messages can be sent to your provider as well.   To learn more about what you can do with MyChart, go to [place].    Your next appointment:   4 week(s)- ok to use 10/14 at 11:40am  The format for your next appointment:   In Person  Provider:   You may see [facility], MD or one of the following Advanced Practice Providers on your designated Care Team:   [email or web address], NP   Other Instructions  [employer] Cardiac Event Monitor Instructions Your physician has requested you wear your cardiac event monitor for _____ days, (1-30). [employer] may call or text to confirm a shipping address. The monitor will be sent to a land address via UPS. [employer] will not ship a monitor to a PO BOX. It typically takes 3-5 days to receive your monitor after it has been enrolled. [employer] will assist with USPS tracking if your package is delayed. The telephone number for [employer] is [phone].  Once you have received your monitor, please review the enclosed instructions. Instruction tutorials can also be viewed under help and settings on the enclosed cell phone. Your monitor has already been registered assigning a specific monitor serial # to you.  Applying the monitor Remove cell phone from case and turn it on. The cell phone works as Nurse, learning disability and needs to be within [employer] of you at all times. The cell phone will need to be charged on a daily basis. We recommend you plug the cell phone into the enclosed charger at your bedside table every night.  Monitor batteries: You will receive two monitor batteries labelled #1 and #2. These are your recorders. Plug battery #2 onto the second connection on the enclosed charger. Keep one battery on the charger at all times. This will keep the monitor battery deactivated. It will also keep it fully charged for when you need to switch your monitor batteries. A small light will be blinking on the battery emblem when it is charging. The light on the battery emblem will remain on when the battery is fully charged.  Open package of a Monitor strip. Insert battery #1 into black hood on strip and gently squeeze monitor battery onto connection as indicated in instruction booklet. Set aside while preparing skin.  Choose location for your strip, vertical or horizontal, as indicated in the instruction booklet. Shave to remove all hair from location. There cannot be any lotions, oils, powders, or colognes on skin where monitor is to be applied. Wipe skin clean with enclosed Saline wipe. Dry skin completely.  Peel paper labeled #1 off the back of the Monitor strip exposing the adhesive. Place the monitor on the chest in the vertical or horizontal position shown in the instruction booklet. One arrow on the monitor strip must be pointing upward. Carefully remove paper labeled #2, attaching remainder of strip to your skin. Try not to create any folds or wrinkles in the strip as you apply it.  Firmly press and release the circle in the center of the monitor battery. You will hear a small beep. This is turning the monitor battery on. The heart emblem on the monitor battery will light up every 5 seconds if the monitor battery in turned on and connected to the patient securely. Do not push and hold the  circle down as this turns the monitor battery off. The cell phone will locate the monitor battery. A screen will appear on the cell phone checking the connection of your monitor strip. This may read poor connection initially but change to good connection within the next minute. Once your monitor accepts the connection you will hear a series of 3 beeps followed by a climbing crescendo of beeps. A screen will appear on the cell phone showing the two monitor strip placement options. Touch the picture that demonstrates where you applied the monitor strip.  Your monitor strip and battery are waterproof. You are able to shower, bathe, or swim with the monitor on. They just ask you do not submerge deeper than 3 feet underwater. We recommend removing the monitor if you are swimming in a lake, river, or ocean.  Your monitor battery will need to be switched to a fully charged monitor battery approximately once a week. The cell phone will alert you of an action which needs to be made.  On the cell phone, tap for details to reveal connection status, monitor battery status, and cell phone battery status. The green dots indicates your  monitor is in good status. A red dot indicates there is something that needs your attention.  To record a symptom, click the circle on the monitor battery. In 30-60 seconds a list of symptoms will appear on the cell phone. Select your symptom and tap save. Your monitor will record a sustained or significant arrhythmia regardless of you clicking the button. Some patients do not feel the heart rhythm irregularities. [employer] will notify us of any serious or critical events.  Refer to instruction booklet for instructions on switching batteries, changing strips, the Do not disturb or Pause features, or any additional questions.  Call [employer] at [phone], to confirm your monitor is transmitting and record your baseline. They will answer any questions you may have  regarding the monitor instructions at that time.  Returning the monitor to [employer] Place all equipment back into blue box. Peel off strip of paper to expose adhesive and close box securely. There is a prepaid UPS shipping label on this box. Drop in a UPS drop box, or at a UPS facility like [employer]. You may also contact [employer] to arrange UPS to pick up monitor package at your home.

## 2021-06-21 LAB — CBC
Hematocrit: 38 % (ref 34.0–46.6)
Hemoglobin: 11.6 g/dL (ref 11.1–15.9)
MCH: 25.2 pg — ABNORMAL LOW (ref 26.6–33.0)
MCHC: 30.5 g/dL — ABNORMAL LOW (ref 31.5–35.7)
MCV: 82 fL (ref 79–97)
Platelets: 391 10*3/uL (ref 150–450)
RBC: 4.61 x10E6/uL (ref 3.77–5.28)
RDW: 15.5 % — ABNORMAL HIGH (ref 11.7–15.4)
WBC: 8.4 10*3/uL (ref 3.4–10.8)

## 2021-06-21 LAB — HEPATIC FUNCTION PANEL
ALT: 21 IU/L (ref 0–32)
AST: 34 IU/L (ref 0–40)
Albumin: 3.5 g/dL — ABNORMAL LOW (ref 3.7–4.7)
Alkaline Phosphatase: 90 IU/L (ref 44–121)
Bilirubin Total: 0.4 mg/dL (ref 0.0–1.2)
Bilirubin, Direct: 0.14 mg/dL (ref 0.00–0.40)
Total Protein: 6 g/dL (ref 6.0–8.5)

## 2021-06-21 LAB — BASIC METABOLIC PANEL
BUN/Creatinine Ratio: 14 (ref 12–28)
BUN: 12 mg/dL (ref 8–27)
CO2: 19 mmol/L — ABNORMAL LOW (ref 20–29)
Calcium: 8.1 mg/dL — ABNORMAL LOW (ref 8.7–10.3)
Chloride: 107 mmol/L — ABNORMAL HIGH (ref 96–106)
Creatinine, Ser: 0.87 mg/dL (ref 0.57–1.00)
Glucose: 139 mg/dL — ABNORMAL HIGH (ref 65–99)
Potassium: 3.7 mmol/L (ref 3.5–5.2)
Sodium: 142 mmol/L (ref 134–144)
eGFR: 70 mL/min/{1.73_m2} (ref >59)

## 2021-06-21 LAB — PRO B NATRIURETIC PEPTIDE: NT-Pro BNP: 1257 pg/mL — ABNORMAL HIGH (ref 0–301)

## 2021-06-23 ENCOUNTER — Other Ambulatory Visit: Payer: Self-pay

## 2021-06-23 ENCOUNTER — Ambulatory Visit (HOSPITAL_COMMUNITY)
Admission: RE | Admit: 2021-06-23 | Discharge: 2021-06-23 | Disposition: A | Discharge status: Home / Self Care | Payer: Medicare HMO | Source: Ambulatory Visit | Attending: Cardiology | Admitting: Cardiology

## 2021-06-23 DIAGNOSIS — R600 Localized edema: Secondary | ICD-10-CM | POA: Diagnosis not present

## 2021-06-25 ENCOUNTER — Ambulatory Visit (INDEPENDENT_AMBULATORY_CARE_PROVIDER_SITE_OTHER): Payer: Medicare HMO

## 2021-06-25 ENCOUNTER — Encounter: Payer: Self-pay | Admitting: Interventional Cardiology

## 2021-06-25 DIAGNOSIS — I480 Paroxysmal atrial fibrillation: Secondary | ICD-10-CM

## 2021-06-25 DIAGNOSIS — R55 Syncope and collapse: Secondary | ICD-10-CM

## 2021-06-27 ENCOUNTER — Telehealth: Payer: Self-pay | Admitting: Medical

## 2021-06-27 NOTE — Telephone Encounter (Signed)
   Received a call from [employer] with critical EKG report. First documented atrial fibrillation - sinus brady 46 bpm>atrial fibrillation 60 bpm auto detect at 5:11am CST. [employer] left a voicemail for patient.   She was incidentally found to have paroxysmal atrial fibrillation at an office visit with Dr. Katrinka Blazing 06/20/21 and was started on eliquis 5mg  BID for stroke ppx and metoprolol succinate 25mg  daily for rate control.   I attempted to contact the patient as well, however phone went straight to voicemail.   I will route to triage to follow-up with patient and ensure symptoms are well controlled. Plan to continue ongoing cardiac monitoring to assess Afib burden. Rates are well controlled and she is appropriately anticoagulated.   , PA-C 06/27/21; 7:43 AM

## 2021-06-27 NOTE — Telephone Encounter (Signed)
Left message for patient to call back  

## 2021-06-27 NOTE — Telephone Encounter (Signed)
Attempted phone call to pt and advised to contact RN at [phone].

## 2021-06-30 ENCOUNTER — Telehealth: Payer: Self-pay

## 2021-06-30 NOTE — Telephone Encounter (Signed)
Received a [employer] critical monitor report from 06/25/21.  PA and RN have attempted to contact pt previously messages left.  I called pt unable to leave a voicemail as mailbox is full.  I called 2nd number listed on chart and was able to leave a voicemail requesting that pt call back.  [phone]     Cardiac Monitor Alert  Date of alert:  06/30/2021   Patient Name: Nancy Hinton  DOB: 10/23/1945  MRN: [id number]   [facility] Cardiologist: None  [facility] EP:  None    Monitor Information: Cardiac Event Monitor [[employer]]  Reason:  VT, Sb1st degree Ordering provider:  Verdis Prime, MD   Alert Ventricular Tachycardia, SB/1st degree AVB This is the 2nd alert for this rhythm.   Next Cardiology Appointment   Date:  07/25/21 at 11:40 am  Provider:  Verdis Prime, MD  The patient could NOT be reached by telephone today.    Arrhythmia, symptoms and history reviewed with Blima Rich, PA attempted to call pt on 06/27/21.   Plan:  Attempt to reach pt   Other:   Macie Burows, RN  06/30/2021 10:13 AM

## 2021-07-01 NOTE — Telephone Encounter (Signed)
Called pt to f/u critical heart monitor.  Mailbox full unable to leave a message.  I will send a letter to pt requesting that she call our office.

## 2021-07-04 NOTE — Telephone Encounter (Signed)
Follow Up:      Patient is retuning Jennifer's call from yesterday, concerning her results.

## 2021-07-04 NOTE — Telephone Encounter (Signed)
See previous note ./cy ?

## 2021-07-04 NOTE — Telephone Encounter (Signed)
Spoke with pt and was  not aware of episode in question Per pt would have been sleeping./cy

## 2021-07-07 ENCOUNTER — Other Ambulatory Visit: Payer: Self-pay | Admitting: *Deleted

## 2021-07-07 MED ORDER — FUROSEMIDE 20 MG PO TABS: 20.0000 mg | ORAL_TABLET | Freq: Every day | ORAL | 0 refills | Status: DC

## 2021-07-07 MED ORDER — POTASSIUM CHLORIDE ER 10 MEQ PO TBCR: 10.0000 meq | EXTENDED_RELEASE_TABLET | Freq: Every day | ORAL | 3 refills | Status: DC

## 2021-07-11 ENCOUNTER — Other Ambulatory Visit: Payer: Self-pay

## 2021-07-11 ENCOUNTER — Ambulatory Visit (HOSPITAL_COMMUNITY): Payer: Medicare HMO | Attending: Cardiology

## 2021-07-11 DIAGNOSIS — I480 Paroxysmal atrial fibrillation: Secondary | ICD-10-CM | POA: Insufficient documentation

## 2021-07-11 DIAGNOSIS — R55 Syncope and collapse: Secondary | ICD-10-CM | POA: Diagnosis not present

## 2021-07-12 DIAGNOSIS — Z20822 Contact with and (suspected) exposure to covid-19: Secondary | ICD-10-CM | POA: Diagnosis not present

## 2021-07-13 DIAGNOSIS — Z20822 Contact with and (suspected) exposure to covid-19: Secondary | ICD-10-CM | POA: Diagnosis not present

## 2021-07-13 LAB — ECHOCARDIOGRAM COMPLETE
AR max vel: 2.48 cm2
AV Area VTI: 2.37 cm2
AV Area mean vel: 2.41 cm2
AV Mean grad: 5 mmHg
AV Peak grad: 9.1 mmHg
Ao pk vel: 1.51 m/s
Area-P 1/2: 3.12 cm2
MV M vel: 6.54 m/s
MV Peak grad: 171.1 mmHg
S' Lateral: 3.5 cm

## 2021-07-14 DIAGNOSIS — Z20822 Contact with and (suspected) exposure to covid-19: Secondary | ICD-10-CM | POA: Diagnosis not present

## 2021-07-17 ENCOUNTER — Telehealth: Payer: Self-pay | Admitting: Interventional Cardiology

## 2021-07-17 NOTE — Telephone Encounter (Signed)
Informed pt of results. Pt verbalized understanding. 

## 2021-07-17 NOTE — Telephone Encounter (Signed)
° °  Pt is returning call to get echo result °

## 2021-07-23 NOTE — Progress Notes (Signed)
Cardiology Office Note:    Date:  07/25/2021   ID:  Nancy Hinton, DOB [birth year], MRN [id number]  PCP:  Lenell Antu, DO  Cardiologist:  Lesleigh Noe, MD   Referring MD: Lenell Antu, DO   Chief Complaint  Patient presents with   Follow-up    Syncope Nonsustained VT     History of Present Illness:    Nancy Hinton is a 75 y.o. female with a hx of DM II, morbid obesity, primary hypertension, RBBB, anxiety disorder, and recent syncope referred for evaluation.  Her isolated episode of syncope occurred while sitting, she awakened on the floor, upon awakening she was having nausea but no chest pain or palpitations.  She has never had a prior or subsequent event.  Longstanding diabetes.  Past Medical History:  Diagnosis Date   Arthritis    Diabetes mellitus without complication (HCC)    history of DM prior to gastric bypass, she has not had to be on medicine since 170 lb weightloss.   Hypertension     Past Surgical History:  Procedure Laterality Date   COLON RESECTION     GASTRIC BYPASS     TONSILLECTOMY      Current Medications: Current Meds  Medication Sig   ACCU-CHEK GUIDE test strip    apixaban (ELIQUIS) 5 MG TABS tablet Take 1 tablet (5 mg total) by mouth 2 (two) times daily.   buPROPion (WELLBUTRIN XL) 150 MG 24 hr tablet Take 150 mg by mouth daily.   clonazePAM (KLONOPIN) 1 MG tablet TAKE 1 TABLET BY MOUTH EVERY NIGHT AT BEDTIME AS NEEDED FOR ANXIETY   furosemide (LASIX) 20 MG tablet Take 1 tablet (20 mg total) by mouth daily.   glipiZIDE (GLUCOTROL XL) 10 MG 24 hr tablet Take 10 mg by mouth daily with breakfast.   levothyroxine (SYNTHROID, LEVOTHROID) 88 MCG tablet TAKE 1 TABLET BY MOUTH EVERY DAY BEFORE BREAKFAST   lisinopril (PRINIVIL,ZESTRIL) 20 MG tablet Take 1 tablet (20 mg total) by mouth daily.   metoprolol succinate (TOPROL XL) 25 MG 24 hr tablet Take 1 tablet (25 mg total) by mouth daily.   Multiple Vitamin (MULITIVITAMIN WITH  MINERALS) TABS Take 1 tablet by mouth daily. Reported on 12/20/2015   potassium chloride (KLOR-CON) 10 MEQ tablet Take 1 tablet (10 mEq total) by mouth daily.   simvastatin (ZOCOR) 10 MG tablet Take 10 mg by mouth daily.   tiZANidine (ZANAFLEX) 2 MG tablet Take 1 tablet by mouth daily.   topiramate (TOPAMAX) 50 MG tablet Take 60 mg by mouth daily.   Vitamin D, Ergocalciferol, (DRISDOL) 50000 units CAPS capsule TAKE 1 CAPSULE BY MOUTH EVERY TUESDAY     Allergies:   Codeine   Social History   Socioeconomic History   Marital status: Divorced    Spouse name: Not on file   Number of children: Not on file   Years of education: Not on file   Highest education level: Not on file  Occupational History   Not on file  Tobacco Use   Smoking status: Never   Smokeless tobacco: Never  Substance and Sexual Activity   Alcohol use: No   Drug use: No   Sexual activity: Never  Other Topics Concern   Not on file  Social History Narrative   Not on file   Social Determinants of Health   Financial Resource Strain: Not on file  Food Insecurity: Not on file  Transportation Needs: Not on file  Physical Activity:  Not on file  Stress: Not on file  Social Connections: Not on file     Family History: The patient's family history includes Alzheimer's disease in her mother; Breast cancer in her maternal grandmother; Heart attack in her maternal grandmother; Heart disease in her father.  ROS:   Please see the history of present illness.    She feels well.  She is gone about her daily activities.  She has completed the monitor which was returned yesterday.  All other systems reviewed and are negative.  EKGs/Labs/Other Studies Reviewed:    The following studies were reviewed today:  4 week MONITOR Result pending  ECHOCARDIOGRAPHY 06/2021: IMPRESSIONS   1. Left ventricular ejection fraction, by estimation, is 50 to 55%. The  left ventricle has low normal function. The left ventricle has no  regional  wall motion abnormalities. Left ventricular diastolic parameters were  normal.   2. Right ventricular systolic function is normal. The right ventricular  size is mildly enlarged. Tricuspid regurgitation signal is inadequate for  assessing PA pressure.   3. Left atrial size was severely dilated.   4. The mitral valve is normal in structure. Mild to moderate mitral valve  regurgitation. No evidence of mitral stenosis.   5. The aortic valve is tricuspid. Aortic valve regurgitation is not  visualized. No aortic stenosis is present.   6. Pulmonic valve regurgitation is mild to moderate.   7. Aortic dilatation noted. There is mild dilatation of the ascending  aorta, measuring 38 mm.   8. The inferior vena cava is dilated in size with >50% respiratory  variability, suggesting right atrial pressure of 8 mmHg.   EKG:  EKG not repeated today.  Recent Labs: 06/20/2021: ALT 21; BUN 12; Creatinine, Ser 0.87; Hemoglobin 11.6; NT-Pro BNP 1,257; Platelets 391; Potassium 3.7; Sodium 142  Recent Lipid Panel    Component Value Date/Time   CHOL 167 09/07/2014 0929   TRIG 70 09/07/2014 0929   HDL 57 09/07/2014 0929   CHOLHDL 2.9 09/07/2014 0929   VLDL 14 09/07/2014 0929   LDLCALC 96 09/07/2014 0929    Physical Exam:    VS:  BP 124/62   Pulse (!) 59   Ht 5\' 4"  (1.626 m)   Wt 217 lb 9.6 oz (98.7 kg)   SpO2 99%   BMI 37.35 kg/m     Wt Readings from Last 3 Encounters:  07/25/21 217 lb 9.6 oz (98.7 kg)  06/20/21 229 lb 12.8 oz (104.2 kg)  03/30/18 250 lb 9.6 oz (113.7 kg)     GEN: Weight. No acute distress HEENT: Normal NECK: No JVD. LYMPHATICS: No lymphadenopathy CARDIAC: No murmur. RRR no gallop, or edema. VASCULAR:  normal Pulses. No bruits.   ASSESSMENT:    1. Syncope and collapse   2. RBBB   3. Primary hypertension   4. Controlled type 2 diabetes mellitus with other circulatory complication, without long-term current use of insulin (HCC)   5. Morbid obesity (HCC)     PLAN:    In order of problems listed above:  Etiology is not certain at this time.  Possibly vagal.  2 events on the monitor have demonstrated nonsustained VT with longest episode being 8 beats.  This will require an ischemia evaluation.  I have chosen to perform coronary CTA with FFR if indicated. Noted Well-controlled Significant risk factor for CAD.  Because of diabetes, CT a with FFR seems the most appropriate study to rule out critical coronary disease.    Plan 62-month follow-up  appointment to correlate data and determine if further work-up is needed.  If no significant obstructive coronary disease, we will simply clinically observe.   Medication Adjustments/Labs and Tests Ordered: Current medicines are reviewed at length with the patient today.  Concerns regarding medicines are outlined above.  Orders Placed This Encounter  Procedures   CT CORONARY MORPH W/CTA COR W/SCORE W/CA W/CM &/OR WO/CM   Basic metabolic panel   Hepatic function panel   Lipid panel    No orders of the defined types were placed in this encounter.   Patient Instructions  Medication Instructions:  Your physician recommends that you continue on your current medications as directed. Please refer to the Current Medication list given to you today.  *If you need a refill on your cardiac medications before your next appointment, please call your pharmacy*   Lab Work: Liver, Lipid and BMET today  If you have labs (blood work) drawn today and your tests are completely normal, you will receive your results only by: MyChart Message (if you have MyChart) OR A paper copy in the mail If you have any lab test that is abnormal or we need to change your treatment, we will call you to review the results.   Testing/Procedures: Your physician recommends that you have a Coronary CT performed.   Follow-Up: At [facility], you and your health needs are our priority.  As part of our continuing mission to  provide you with exceptional heart care, we have created designated Provider Care Teams.  These Care Teams include your primary Cardiologist (physician) and Advanced Practice Providers (APPs -  Physician Assistants and Nurse Practitioners) who all work together to provide you with the care you need, when you need it.  We recommend signing up for the patient portal called "MyChart".  Sign up information is provided on this After Visit Summary.  MyChart is used to connect with patients for Virtual Visits (Telemedicine).  Patients are able to view lab/test results, encounter notes, upcoming appointments, etc.  Non-urgent messages can be sent to your provider as well.   To learn more about what you can do with MyChart, go to [email or web address].    Your next appointment:   3 month(s)  The format for your next appointment:   In Person  Provider:   You may see Lesleigh Noe, MD or one of the following Advanced Practice Providers on your designated Care Team:   Nada Boozer, NP   Other Instructions      Signed, Lesleigh Noe, MD  07/25/2021 8:36 AM    [facility]

## 2021-07-24 ENCOUNTER — Telehealth: Payer: Self-pay | Admitting: Interventional Cardiology

## 2021-07-24 NOTE — Telephone Encounter (Signed)
[employer] called with a critical EKG for this patient

## 2021-07-24 NOTE — Telephone Encounter (Addendum)
   Cardiac Monitor Alert  Date of alert:  07/24/2021   Patient Name: Nancy Hinton  DOB: Jun 11, 1946  MRN: [id number]   [facility] Cardiologist: None  [facility] EP:  None    Monitor Information: Cardiac Event Monitor [[employer]]  Reason:Syncope with collapse Ordering provider:  Katrinka Blazing   Alert Ventricular Tachycardia This is the 1st alert for this rhythm.   Next Cardiology Appointment   Date:  07/25/2021 Provider:  Dr Katrinka Blazing  The patient was contacted today.  She is asymptomatic. Arrhythmia, symptoms and history reviewed with Dr Katrinka Blazing.  Plan:  No intervention needed at this time per Dr Geoffry Paradise, RN  07/24/2021 8:55 AM

## 2021-07-24 NOTE — Telephone Encounter (Signed)
Spoke with Apolinar Junes with [employer] who reports on 07/22/2021 at 147pm CT pt had 8 beats of V-tach with rate of 196, underlying SR at 73bpm.

## 2021-07-25 ENCOUNTER — Encounter: Payer: Self-pay | Admitting: Interventional Cardiology

## 2021-07-25 ENCOUNTER — Other Ambulatory Visit: Payer: Self-pay

## 2021-07-25 ENCOUNTER — Ambulatory Visit: Payer: Medicare HMO | Admitting: Interventional Cardiology

## 2021-07-25 VITALS — BP 124/62 | HR 59 | Ht 64.0 in | Wt 217.6 lb

## 2021-07-25 DIAGNOSIS — E1159 Type 2 diabetes mellitus with other circulatory complications: Secondary | ICD-10-CM

## 2021-07-25 DIAGNOSIS — I10 Essential (primary) hypertension: Secondary | ICD-10-CM | POA: Diagnosis not present

## 2021-07-25 DIAGNOSIS — R55 Syncope and collapse: Secondary | ICD-10-CM | POA: Diagnosis not present

## 2021-07-25 DIAGNOSIS — I4510 Unspecified right bundle-branch block: Secondary | ICD-10-CM

## 2021-07-25 DIAGNOSIS — I480 Paroxysmal atrial fibrillation: Secondary | ICD-10-CM

## 2021-07-25 LAB — BASIC METABOLIC PANEL
BUN/Creatinine Ratio: 24 (ref 12–28)
BUN: 21 mg/dL (ref 8–27)
CO2: 21 mmol/L (ref 20–29)
Calcium: 8.1 mg/dL — ABNORMAL LOW (ref 8.7–10.3)
Chloride: 107 mmol/L — ABNORMAL HIGH (ref 96–106)
Creatinine, Ser: 0.86 mg/dL (ref 0.57–1.00)
Glucose: 147 mg/dL — ABNORMAL HIGH (ref 70–99)
Potassium: 4 mmol/L (ref 3.5–5.2)
Sodium: 140 mmol/L (ref 134–144)
eGFR: 71 mL/min/{1.73_m2} (ref >59)

## 2021-07-25 LAB — LIPID PANEL
Chol/HDL Ratio: 3 ratio (ref 0.0–4.4)
Cholesterol, Total: 144 mg/dL (ref 100–199)
HDL: 48 mg/dL (ref >39)
LDL Chol Calc (NIH): 76 mg/dL (ref 0–99)
Triglycerides: 110 mg/dL (ref 0–149)
VLDL Cholesterol Cal: 20 mg/dL (ref 5–40)

## 2021-07-25 LAB — HEPATIC FUNCTION PANEL
ALT: 18 IU/L (ref 0–32)
AST: 29 IU/L (ref 0–40)
Albumin: 3.4 g/dL — ABNORMAL LOW (ref 3.7–4.7)
Alkaline Phosphatase: 86 IU/L (ref 44–121)
Bilirubin Total: 0.4 mg/dL (ref 0.0–1.2)
Bilirubin, Direct: 0.12 mg/dL (ref 0.00–0.40)
Total Protein: 5.9 g/dL — ABNORMAL LOW (ref 6.0–8.5)

## 2021-07-25 NOTE — Patient Instructions (Addendum)
Medication Instructions:  Your physician recommends that you continue on your current medications as directed. Please refer to the Current Medication list given to you today.  *If you need a refill on your cardiac medications before your next appointment, please call your pharmacy*   Lab Work: Liver, Lipid and BMET today  If you have labs (blood work) drawn today and your tests are completely normal, you will receive your results only by: MyChart Message (if you have MyChart) OR A paper copy in the mail If you have any lab test that is abnormal or we need to change your treatment, we will call you to review the results.   Testing/Procedures: Your physician recommends that you have a Coronary CT performed.   Follow-Up: At [facility], you and your health needs are our priority.  As part of our continuing mission to provide you with exceptional heart care, we have created designated Provider Care Teams.  These Care Teams include your primary Cardiologist (physician) and Advanced Practice Providers (APPs -  Physician Assistants and Nurse Practitioners) who all work together to provide you with the care you need, when you need it.  We recommend signing up for the patient portal called "MyChart".  Sign up information is provided on this After Visit Summary.  MyChart is used to connect with patients for Virtual Visits (Telemedicine).  Patients are able to view lab/test results, encounter notes, upcoming appointments, etc.  Non-urgent messages can be sent to your provider as well.   To learn more about what you can do with MyChart, go to [email or web address].    Your next appointment:   3 month(s)  The format for your next appointment:   In Person  Provider:   You may see Lesleigh Noe, MD or one of the following Advanced Practice Providers on your designated Care Team:   Nada Boozer, NP   Other Instructions    Your cardiac CT will be scheduled at one of the below  locations:   [facility] [address] [place], [place] [address] [phone]  OR  [facility] [address] [place], [place] [address] [phone]  If scheduled at [facility], please arrive at the [facility] main entrance (entrance A) of [facility] 30 minutes prior to test start time. Proceed to the [facility] Radiology Department (first floor) to check-in and test prep.  If scheduled at [facility], please arrive 15 mins early for check-in and test prep.  Please follow these instructions carefully (unless otherwise directed):  Hold all erectile dysfunction medications at least 3 days (72 hrs) prior to test.  On the Night Before the Test: Be sure to Drink plenty of water. Do not consume any caffeinated/decaffeinated beverages or chocolate 12 hours prior to your test. Do not take any antihistamines 12 hours prior to your test.   On the Day of the Test: Drink plenty of water until 1 hour prior to the test. Do not eat any food 4 hours prior to the test. You may take your regular medications prior to the test.  Take two metoprolol  the morning of your CT. HOLD Furosemide/Hydrochlorothiazide morning of the test. FEMALES- please wear underwire-free bra if available, avoid dresses & tight clothing       After the Test: Drink plenty of water. After receiving IV contrast, you may experience a mild flushed feeling. This is normal. On occasion, you may experience a mild rash up  to 24 hours after the test. This is not dangerous. If this occurs, you can take Benadryl 25 mg and increase your fluid intake. If you experience trouble breathing, this can be serious. If it is severe call 911 IMMEDIATELY. If it is mild, please call our office. If you take any of these medications: Glipizide/Metformin, Avandament, Glucavance, please do not take 48 hours after completing test unless  otherwise instructed.  Please allow 2-4 weeks for scheduling of routine cardiac CTs. Some insurance companies require a pre-authorization which may delay scheduling of this test.   For non-scheduling related questions, please contact the cardiac imaging nurse navigator should you have any questions/concerns: Rockwell Alexandria, Cardiac Imaging Nurse Navigator Larey Brick, Cardiac Imaging Nurse Navigator [facility] Direct Office Dial: [phone]   For scheduling needs, including cancellations and rescheduling, please call [place], [phone].

## 2021-08-01 ENCOUNTER — Ambulatory Visit (HOSPITAL_COMMUNITY): Payer: Medicare HMO

## 2021-08-04 ENCOUNTER — Telehealth (HOSPITAL_COMMUNITY): Payer: Self-pay | Admitting: *Deleted

## 2021-08-04 NOTE — Telephone Encounter (Signed)
Reaching out to patient to offer assistance regarding upcoming cardiac imaging study; pt verbalizes understanding of appt date/time, but wishes to re-schedule due to family issues.  Patient re-schedule to August 18, 2021 at 1pm. She is aware we will give her another call closer to her new appointment.   Larey Brick RN Navigator Cardiac Imaging [facility] [phone] office [phone] cell

## 2021-08-05 ENCOUNTER — Ambulatory Visit (HOSPITAL_COMMUNITY): Admission: RE | Admit: 2021-08-05 | Payer: Medicare HMO | Source: Ambulatory Visit

## 2021-08-14 ENCOUNTER — Telehealth (HOSPITAL_COMMUNITY): Payer: Self-pay | Admitting: Emergency Medicine

## 2021-08-14 NOTE — Telephone Encounter (Signed)
Attempted to call patient regarding upcoming cardiac CT appointment. °Left message on voicemail with name and callback number °Addalee Kavanagh RN Navigator Cardiac Imaging °[facility] °[phone] Office °[phone] Cell ° °

## 2021-08-15 ENCOUNTER — Telehealth (HOSPITAL_COMMUNITY): Payer: Self-pay | Admitting: Emergency Medicine

## 2021-08-15 NOTE — Telephone Encounter (Signed)
Pt calling to r/s her ccta appt.  New appt 08/26/21 at 8:30am  Will call her closer to appt to review ccta instructions  Rockwell Alexandria RN Navigator Cardiac Imaging [facility] [phone] Office  [phone] Cell

## 2021-08-18 ENCOUNTER — Ambulatory Visit (HOSPITAL_COMMUNITY): Payer: Medicare HMO

## 2021-08-26 ENCOUNTER — Ambulatory Visit (HOSPITAL_COMMUNITY)
Admission: RE | Admit: 2021-08-26 | Discharge: 2021-08-26 | Disposition: A | Discharge status: Home / Self Care | Payer: Medicare HMO | Source: Ambulatory Visit | Attending: Interventional Cardiology | Admitting: Interventional Cardiology

## 2021-08-26 MED ORDER — NITROGLYCERIN 0.4 MG SL SUBL: 0.8000 mg | SUBLINGUAL_TABLET | Freq: Once | SUBLINGUAL | Status: DC

## 2021-09-11 ENCOUNTER — Telehealth (HOSPITAL_COMMUNITY): Payer: Self-pay | Admitting: *Deleted

## 2021-09-11 NOTE — Telephone Encounter (Signed)
Reaching out to patient to offer assistance regarding upcoming cardiac imaging study; pt verbalizes understanding of appt date/time, parking situation and where to check in, pre-test NPO status and medications ordered, and verified current allergies; name and call back number provided for further questions should they arise  Koen Antilla RN Navigator Cardiac Imaging [facility] [phone] office [phone] cell  

## 2021-09-15 ENCOUNTER — Ambulatory Visit (HOSPITAL_COMMUNITY)
Admission: RE | Admit: 2021-09-15 | Discharge: 2021-09-15 | Disposition: A | Discharge status: Home / Self Care | Payer: Medicare HMO | Source: Ambulatory Visit | Attending: Interventional Cardiology | Admitting: Interventional Cardiology

## 2021-09-15 MED ORDER — NITROGLYCERIN 0.4 MG SL SUBL: 0.8000 mg | SUBLINGUAL_TABLET | Freq: Once | SUBLINGUAL | Status: DC

## 2021-09-22 ENCOUNTER — Other Ambulatory Visit: Payer: Self-pay | Admitting: *Deleted

## 2021-09-22 MED ORDER — APIXABAN 5 MG PO TABS: 5.0000 mg | ORAL_TABLET | Freq: Two times a day (BID) | ORAL | 1 refills | Status: DC

## 2021-09-22 NOTE — Telephone Encounter (Signed)
Prescription refill request for Eliquis received. Indication: afib  Last office visit: Katrinka Blazing 07/25/2021 Scr: 0.87, 06/20/2021 Age: 75 yo  Weight: 98.7 kg   Refill sent.

## 2021-10-07 ENCOUNTER — Other Ambulatory Visit: Payer: Self-pay | Admitting: *Deleted

## 2021-10-07 MED ORDER — POTASSIUM CHLORIDE ER 10 MEQ PO TBCR: 10.0000 meq | EXTENDED_RELEASE_TABLET | Freq: Every day | ORAL | 3 refills | Status: DC

## 2021-10-07 MED ORDER — METOPROLOL SUCCINATE ER 25 MG PO TB24: 25.0000 mg | ORAL_TABLET | Freq: Every day | ORAL | 3 refills | Status: DC

## 2021-10-17 DIAGNOSIS — E785 Hyperlipidemia, unspecified: Secondary | ICD-10-CM | POA: Diagnosis not present

## 2021-10-17 DIAGNOSIS — I480 Paroxysmal atrial fibrillation: Secondary | ICD-10-CM | POA: Diagnosis not present

## 2021-10-17 DIAGNOSIS — I951 Orthostatic hypotension: Secondary | ICD-10-CM | POA: Diagnosis not present

## 2021-10-17 DIAGNOSIS — I5020 Unspecified systolic (congestive) heart failure: Secondary | ICD-10-CM | POA: Diagnosis not present

## 2021-10-29 ENCOUNTER — Ambulatory Visit: Payer: Medicare HMO | Admitting: Interventional Cardiology

## 2021-10-31 ENCOUNTER — Encounter (HOSPITAL_COMMUNITY): Payer: Self-pay

## 2021-11-18 ENCOUNTER — Other Ambulatory Visit: Payer: Self-pay | Admitting: *Deleted

## 2021-11-18 ENCOUNTER — Telehealth: Payer: Self-pay | Admitting: *Deleted

## 2021-11-18 DIAGNOSIS — I10 Essential (primary) hypertension: Secondary | ICD-10-CM

## 2021-11-18 NOTE — Telephone Encounter (Signed)
Pt called back and scheduled labs for 2/13

## 2021-11-18 NOTE — Telephone Encounter (Signed)
-----   Message from Lorrin Jackson sent at 11/18/2021  9:02 AM EST ----- Regarding: labs Scheduled 12/01/21 at 8:30    Thanks, [place]

## 2021-11-18 NOTE — Telephone Encounter (Signed)
Called pt and left message to call back and schedule BMET prior to CT.

## 2021-11-24 ENCOUNTER — Other Ambulatory Visit: Payer: Medicare HMO | Admitting: *Deleted

## 2021-11-24 ENCOUNTER — Other Ambulatory Visit: Payer: Self-pay

## 2021-11-24 DIAGNOSIS — I10 Essential (primary) hypertension: Secondary | ICD-10-CM

## 2021-11-25 LAB — BASIC METABOLIC PANEL
BUN/Creatinine Ratio: 15 (ref 12–28)
BUN: 13 mg/dL (ref 8–27)
CO2: 22 mmol/L (ref 20–29)
Calcium: 7.7 mg/dL — ABNORMAL LOW (ref 8.7–10.3)
Chloride: 108 mmol/L — ABNORMAL HIGH (ref 96–106)
Creatinine, Ser: 0.85 mg/dL (ref 0.57–1.00)
Glucose: 139 mg/dL — ABNORMAL HIGH (ref 70–99)
Potassium: 4.2 mmol/L (ref 3.5–5.2)
Sodium: 142 mmol/L (ref 134–144)
eGFR: 72 mL/min/{1.73_m2} (ref >59)

## 2021-11-27 DIAGNOSIS — L03116 Cellulitis of left lower limb: Secondary | ICD-10-CM | POA: Diagnosis not present

## 2021-11-27 DIAGNOSIS — E039 Hypothyroidism, unspecified: Secondary | ICD-10-CM | POA: Diagnosis not present

## 2021-11-27 DIAGNOSIS — E1169 Type 2 diabetes mellitus with other specified complication: Secondary | ICD-10-CM | POA: Diagnosis not present

## 2021-11-27 DIAGNOSIS — Z7984 Long term (current) use of oral hypoglycemic drugs: Secondary | ICD-10-CM | POA: Diagnosis not present

## 2021-11-27 DIAGNOSIS — F418 Other specified anxiety disorders: Secondary | ICD-10-CM | POA: Diagnosis not present

## 2021-11-27 DIAGNOSIS — E119 Type 2 diabetes mellitus without complications: Secondary | ICD-10-CM | POA: Diagnosis not present

## 2021-11-27 DIAGNOSIS — I10 Essential (primary) hypertension: Secondary | ICD-10-CM | POA: Diagnosis not present

## 2021-11-27 DIAGNOSIS — E785 Hyperlipidemia, unspecified: Secondary | ICD-10-CM | POA: Diagnosis not present

## 2021-11-28 ENCOUNTER — Telehealth (HOSPITAL_COMMUNITY): Payer: Self-pay | Admitting: *Deleted

## 2021-11-28 NOTE — Telephone Encounter (Signed)
Reaching out to patient to offer assistance regarding upcoming cardiac imaging study; pt verbalizes understanding of appt date/time, parking situation and where to check in, pre-test NPO status and verified current allergies; name and call back number provided for further questions should they arise  Larey Brick RN Navigator Cardiac Imaging [facility] [phone] office [phone] cell  Patient to take her daily metoprolol succiante two hours prior to her cardiac CT. She is aware to arrive at 8am for her 8:30am scan.

## 2021-12-01 ENCOUNTER — Other Ambulatory Visit: Payer: Self-pay

## 2021-12-01 ENCOUNTER — Ambulatory Visit (HOSPITAL_COMMUNITY)
Admission: RE | Admit: 2021-12-01 | Discharge: 2021-12-01 | Disposition: A | Discharge status: Home / Self Care | Payer: Medicare HMO | Source: Ambulatory Visit | Attending: Interventional Cardiology | Admitting: Interventional Cardiology

## 2021-12-01 DIAGNOSIS — R55 Syncope and collapse: Secondary | ICD-10-CM

## 2021-12-01 DIAGNOSIS — I2510 Atherosclerotic heart disease of native coronary artery without angina pectoris: Secondary | ICD-10-CM | POA: Insufficient documentation

## 2021-12-01 DIAGNOSIS — I10 Essential (primary) hypertension: Secondary | ICD-10-CM

## 2021-12-01 DIAGNOSIS — I700 Atherosclerosis of aorta: Secondary | ICD-10-CM | POA: Insufficient documentation

## 2021-12-01 DIAGNOSIS — E1159 Type 2 diabetes mellitus with other circulatory complications: Secondary | ICD-10-CM

## 2021-12-01 MED ORDER — NITROGLYCERIN 0.4 MG SL SUBL
SUBLINGUAL_TABLET | SUBLINGUAL | Status: AC
  Filled 2021-12-01: qty 2

## 2021-12-01 MED ORDER — NITROGLYCERIN 0.4 MG SL SUBL
0.8000 mg | SUBLINGUAL_TABLET | Freq: Once | SUBLINGUAL | Status: AC
  Administered 2021-12-01: 0.8 mg via SUBLINGUAL

## 2021-12-01 MED ORDER — IOHEXOL 350 MG/ML SOLN
100.0000 mL | Freq: Once | INTRAVENOUS | Status: AC | PRN
  Administered 2021-12-01: 100 mL via INTRAVENOUS

## 2021-12-08 DIAGNOSIS — Z7984 Long term (current) use of oral hypoglycemic drugs: Secondary | ICD-10-CM | POA: Diagnosis not present

## 2021-12-08 DIAGNOSIS — R600 Localized edema: Secondary | ICD-10-CM | POA: Diagnosis not present

## 2021-12-08 DIAGNOSIS — L03116 Cellulitis of left lower limb: Secondary | ICD-10-CM | POA: Diagnosis not present

## 2021-12-08 DIAGNOSIS — E1169 Type 2 diabetes mellitus with other specified complication: Secondary | ICD-10-CM | POA: Diagnosis not present

## 2021-12-10 ENCOUNTER — Telehealth: Payer: Self-pay | Admitting: Interventional Cardiology

## 2021-12-10 NOTE — Telephone Encounter (Signed)
Patient returning Nancy Hinton's call in regards to CT results.  ?

## 2021-12-11 NOTE — Telephone Encounter (Signed)
Returned call to patient to discuss CT results. ? ?The patient has been notified of the result and verbalized understanding.  All questions (if any) were answered. ?Franchot Gallo, RN 12/11/2021 9:21 AM  ? ?

## 2021-12-11 NOTE — Telephone Encounter (Signed)
Follow Up: ? ? ? ?Patient is retuning Jennifer's call, concerning her CT results. ?

## 2021-12-17 NOTE — Progress Notes (Unsigned)
Cardiology Office Note    Date:  12/17/2021   ID:  Nancy Hinton, Nancy Hinton 1946-10-02, MRN [id number]   PCP:  Glenford Bayley, DO   [place]  Cardiologist:  Sinclair Grooms, MD *** Advanced Practice Provider:  No care team member to display Electrophysiologist:  None   [id number]   No chief complaint on file.   History of Present Illness:  Nancy Hinton is a 76 y.o. female  with a hx of DM II, morbid obesity, primary hypertension, RBBB, anxiety disorder.  Patient saw Dr. Tamala Julian 07/25/21 for episode of syncope while sitting and woke up on floor feeling nausea. 4 week monitor showed 1% AF 4 hrs 38 min, 4% PVC burden, 20% PAC burden and NSVT. Echo 06/2021 low normal LVEF 50-55%, Coronary CTA 11/2021 calcium score 168 25-49% LAD.    Past Medical History:  Diagnosis Date   Arthritis    Diabetes mellitus without complication ([place])    history of DM prior to gastric bypass, she has not had to be on medicine since 170 lb weightloss.   Hypertension     Past Surgical History:  Procedure Laterality Date   COLON RESECTION     GASTRIC BYPASS     TONSILLECTOMY      Current Medications: No outpatient medications have been marked as taking for the 12/31/21 encounter (Appointment) with Imogene Burn, PA-C.     Allergies:   Codeine   Social History   Socioeconomic History   Marital status: Divorced    Spouse name: Not on file   Number of children: Not on file   Years of education: Not on file   Highest education level: Not on file  Occupational History   Not on file  Tobacco Use   Smoking status: Never   Smokeless tobacco: Never  Substance and Sexual Activity   Alcohol use: No   Drug use: No   Sexual activity: Never  Other Topics Concern   Not on file  Social History Narrative   Not on file   Social Determinants of Health   Financial Resource Strain: Not on file  Food Insecurity: Not on file  Transportation Needs: Not on file   Physical Activity: Not on file  Stress: Not on file  Social Connections: Not on file     Family History:  The patient's ***family history includes Alzheimer's disease in her mother; Breast cancer in her maternal grandmother; Heart attack in her maternal grandmother; Heart disease in her father.   ROS:   Please see the history of present illness.    ROS All other systems reviewed and are negative.   PHYSICAL EXAM:   VS:  There were no vitals taken for this visit.  Physical Exam  GEN: Well nourished, well developed, in no acute distress  HEENT: normal  Neck: no JVD, carotid bruits, or masses Cardiac:RRR; no murmurs, rubs, or gallops  Respiratory:  clear to auscultation bilaterally, normal work of breathing GI: soft, nontender, nondistended, + BS Ext: without cyanosis, clubbing, or edema, Good distal pulses bilaterally MS: no deformity or atrophy  Skin: warm and dry, no rash Neuro:  Alert and Oriented x 3, Strength and sensation are intact Psych: euthymic mood, full affect  Wt Readings from Last 3 Encounters:  07/25/21 217 lb 9.6 oz (98.7 kg)  06/20/21 229 lb 12.8 oz (104.2 kg)  03/30/18 250 lb 9.6 oz (113.7 kg)      Studies/Labs Reviewed:   EKG:  EKG is*** ordered today.  The ekg ordered today demonstrates ***  Recent Labs: 06/20/2021: Hemoglobin 11.6; NT-Pro BNP 1,257; Platelets 391 07/25/2021: ALT 18 11/24/2021: BUN 13; Creatinine, Ser 0.85; Potassium 4.2; Sodium 142   Lipid Panel    Component Value Date/Time   CHOL 144 07/25/2021 0846   TRIG 110 07/25/2021 0846   HDL 48 07/25/2021 0846   CHOLHDL 3.0 07/25/2021 0846   CHOLHDL 2.9 09/07/2014 0929   VLDL 14 09/07/2014 0929   LDLCALC 76 07/25/2021 0846    Additional studies/ records that were reviewed today include:  CTA 12/01/21 IMPRESSION: Calcium Score: Calcium isolated to LAD   Coronary Arteries: Right dominant with no anomalies   LM: Normal   LAD: 25-49% calcified plaque in mid vessel   D1: Normal    D2: Normal   Circumflex: Normal   OM1: Normal   OM2: Normal   RCA:  Normal   PDA: Normal   PLA: Normal   IMPRESSION: 1. Calcium score 168 isolated to LAD which is 71 st percentile for age/sex   2.  Dilated ascending thoracic aorta 3.9 cm   3. CAD RADS 3 non obstructive disease in mid LAD see description above      Electronically Signed: By: Jenkins Rouge M.D. On: 12/01/2021 09:11   4 week monitor 07/2021 NSR 1 % burden AF (4 hours 38 minutes) 4% PVC burden 20% PAC burden Non sustained VT   Finalized by Belva Crome, MD on Fri Aug 01, 2021  4:50 PM NSR 1 % burden AF (4 hours 38 minutes) 4% PVC burden 20% PAC burden Non sustained SVT  ECHOCARDIOGRAPHY 06/2021: IMPRESSIONS   1. Left ventricular ejection fraction, by estimation, is 50 to 55%. The  left ventricle has low normal function. The left ventricle has no regional  wall motion abnormalities. Left ventricular diastolic parameters were  normal.   2. Right ventricular systolic function is normal. The right ventricular  size is mildly enlarged. Tricuspid regurgitation signal is inadequate for  assessing PA pressure.   3. Left atrial size was severely dilated.   4. The mitral valve is normal in structure. Mild to moderate mitral valve  regurgitation. No evidence of mitral stenosis.   5. The aortic valve is tricuspid. Aortic valve regurgitation is not  visualized. No aortic stenosis is present.   6. Pulmonic valve regurgitation is mild to moderate.   7. Aortic dilatation noted. There is mild dilatation of the ascending  aorta, measuring 38 mm.   8. The inferior vena cava is dilated in size with >50% respiratory  variability, suggesting right atrial pressure of 8 mmHg.        Risk Assessment/Calculations:   {Does this patient have ATRIAL FIBRILLATION?[phone]}     ASSESSMENT:    1. Paroxysmal atrial fibrillation (HCC)   2. NSVT (nonsustained ventricular tachycardia)   3. Coronary artery  disease involving native coronary artery of native heart without angina pectoris   4. Ascending aorta dilatation (HCC)   5. Syncope and collapse   6. Controlled type 2 diabetes mellitus with other circulatory complication, without long-term current use of insulin ([place])   7. Primary hypertension   8. Morbid obesity ([place])   9. RBBB      PLAN:  In order of problems listed above:  PAF on holter on eliquis  NSVT-normal LVEF on echo and coronary CTA-minimal CAD  CAD-mild on CTA  Dilated ascending thoracic aorta 3.9 cm  Syncope with collapse ? Etiology possibly vasovagal  DM2  HTN  Morbid obesity  RBBB   Shared Decision Making/Informed Consent   {Are you ordering a CV Procedure (e.g. stress test, cath, DCCV, TEE, etc)?   Press F2        :[id number]    Medication Adjustments/Labs and Tests Ordered: Current medicines are reviewed at length with the patient today.  Concerns regarding medicines are outlined above.  Medication changes, Labs and Tests ordered today are listed in the Patient Instructions below. There are no Patient Instructions on file for this visit.   Sumner Boast, PA-C  12/17/2021 7:42 AM    [facility] [place], [place], [place]  [address] Phone: [phone]; Fax: [phone]

## 2021-12-24 NOTE — Progress Notes (Addendum)
Hinton, Nancy FREMIN ([id number]) ?Visit Report for 12/25/2021 ?Allergy List Details ?Patient Name: Date of Service: ?[name], Nancy RIA D. 12/25/2021 9:00 A M ?Medical Record Number: [id number] ?Patient Account Number: [id number] ?Date of Birth/Sex: Treating RN: ?09-23-1946 (76 y.o. F) ?Primary Care Miko Sirico: Rikki Spearing Other Clinician: ?Referring Stasia Somero: ?Treating Mataya Kilduff/Extender: Kalman Shan ?Rikki Spearing ?Weeks in Treatment: 0 ?Allergies ?Active Allergies ?codeine ?Glucophage ?Tylenol ?Allergy Notes ?Electronic Signature(s) ?Signed: 12/24/2021 2:13:27 PM By: Valeria Batman EMT ?Entered By: Valeria Batman on 12/24/2021 14:13:26 ?-------------------------------------------------------------------------------- ?Arrival Information Details ?Patient Name: Date of Service: ?[name], Nancy RIA D. 12/25/2021 9:00 A M ?Medical Record Number: [id number] ?Patient Account Number: [id number] ?Date of Birth/Sex: Treating RN: ?28-Sep-1946 (76 y.o. F) Nancy Hinton, Nancy Hinton ?Primary Care Averyanna Sax: Rikki Spearing Other Clinician: ?Referring Tamberly Pomplun: ?Treating Kip Cropp/Extender: Kalman Shan ?Rikki Spearing ?Weeks in Treatment: 0 ?Visit Information ?Patient Arrived: Ambulatory ?Arrival Time: 09:08 ?Accompanied By: self ?Transfer Assistance: None ?Patient Identification Verified: Yes ?Secondary Verification Process Completed: Yes ?Patient Requires Transmission-Based Precautions: No ?Patient Has Alerts: Yes ?Patient Alerts: Patient on Blood Thinner ?[id number] to obtain;pain ?Electronic Signature(s) ?Signed: 12/25/2021 6:07:05 PM By: Deon Pilling RN, BSN ?Entered By: Deon Pilling on 12/25/2021 09:47:52 ?-------------------------------------------------------------------------------- ?Clinic Level of Care Assessment Details ?Patient Name: Date of Service: ?[name], Nancy RIA D. 12/25/2021 9:00 A M ?Medical Record Number: [id number] ?Patient Account Number: [id number] ?Date of Birth/Sex: Treating RN: ?1946/06/29 (76 y.o. F) Nancy Hinton, Nancy Hinton ?Primary Care Lulani Bour:  Rikki Spearing Other Clinician: ?Referring Lauralee Waters: ?Treating Jessenya Berdan/Extender: Kalman Shan ?Rikki Spearing ?Weeks in Treatment: 0 ?Clinic Level of Care Assessment Items ?TOOL 1 Quantity Score ?X- 1 0 ?Use when EandM and Procedure is performed on INITIAL visit ?ASSESSMENTS - Nursing Assessment / Reassessment ?X- 1 20 ?General Physical Exam (combine w/ comprehensive assessment (listed just below) when performed on new pt. evals) ?X- 1 25 ?Comprehensive Assessment (HX, ROS, Risk Assessments, Wounds Hx, etc.) ?ASSESSMENTS - Wound and Skin Assessment / Reassessment ?X- 1 10 ?Dermatologic / Skin Assessment (not related to wound area) ?ASSESSMENTS - Ostomy and/or Continence Assessment and Care ?[]  - 0 ?Incontinence Assessment and Management ?[]  - 0 ?Ostomy Care Assessment and Management (repouching, etc.) ?PROCESS - Coordination of Care ?[]  - 0 ?Simple Patient / Family Education for ongoing care ?X- 1 20 ?Complex (extensive) Patient / Family Education for ongoing care ?X- 1 10 ?Staff obtains Consents, Records, T Results / Process Orders ?est ?[]  - 0 ?Staff telephones [place], Nursing Homes / Clarify orders / etc ?[]  - 0 ?Routine Transfer to another Facility (non-emergent condition) ?[]  - 0 ?Routine Hospital Admission (non-emergent condition) ?X- 1 15 ?New Admissions / Biomedical engineer / Ordering NPWT Apligraf, etc. ?, ?[]  - 0 ?Emergency Hospital Admission (emergent condition) ?PROCESS - Special Needs ?[]  - 0 ?Pediatric / Minor Patient Management ?[]  - 0 ?Isolation Patient Management ?[]  - 0 ?Hearing / Language / Visual special needs ?[]  - 0 ?Assessment of Community assistance (transportation, D/C planning, etc.) ?[]  - 0 ?Additional assistance / Altered mentation ?[]  - 0 ?Support Surface(s) Assessment (bed, cushion, seat, etc.) ?INTERVENTIONS - Miscellaneous ?[]  - 0 ?External ear exam ?[]  - 0 ?Patient Transfer (multiple staff / Civil Service fast streamer / Similar devices) ?[]  - 0 ?Simple Staple / Suture removal (25 or less) ?[]  -  0 ?Complex Staple / Suture removal (26 or more) ?[]  - 0 ?Hypo/Hyperglycemic Management (do not check if billed separately) ?X- 1 15 ?Ankle / Brachial Index (ABI) - do not check if billed separately ?Has the patient been seen at the hospital within  the last three years: Yes ?Total Score: 115 ?Level Of Care: New/Established - Level 3 ?Electronic Signature(s) ?Signed: 12/25/2021 6:07:05 PM By: Deon Pilling RN, BSN ?Signed: 12/25/2021 6:07:05 PM By: Deon Pilling RN, BSN ?Entered By: Deon Pilling on 12/25/2021 09:57:48 ?-------------------------------------------------------------------------------- ?Encounter Discharge Information Details ?Patient Name: ?Date of Service: ?[name], Nancy RIA D. 12/25/2021 9:00 A M ?Medical Record Number: [id number] ?Patient Account Number: [id number] ?Date of Birth/Sex: ?Treating RN: ?August 06, 1946 (76 y.o. F) Nancy Hinton, Nancy Hinton ?Primary Care Kristy Catoe: Rikki Spearing ?Other Clinician: ?Referring Karisha Marlin: ?Treating Tyren Dugar/Extender: Kalman Shan ?Rikki Spearing ?Weeks in Treatment: 0 ?Encounter Discharge Information Items Post Procedure Vitals ?Discharge Condition: Stable ?Temperature (F): 98.1 ?Ambulatory Status: Ambulatory ?Pulse (bpm): 50 ?Discharge Destination: Home ?Respiratory Rate (breaths/min): 20 ?Transportation: Private Auto ?Blood Pressure (mmHg): 115/58 ?Accompanied By: self ?Schedule Follow-up Appointment: Yes ?Clinical Summary of Care: ?Electronic Signature(s) ?Signed: 12/25/2021 6:07:05 PM By: Deon Pilling RN, BSN ?Entered By: Deon Pilling on 12/25/2021 10:00:34 ?-------------------------------------------------------------------------------- ?Lower Extremity Assessment Details ?Patient Name: ?Date of Service: ?[name], Nancy RIA D. 12/25/2021 9:00 A M ?Medical Record Number: [id number] ?Patient Account Number: [id number] ?Date of Birth/Sex: ?Treating RN: ?October 30, 1945 (76 y.o. F) Nancy Hinton ?Primary Care Asser Lucena: Rikki Spearing ?Other Clinician: ?Referring Alanii Ramer: ?Treating Nameer Summer/Extender:  Kalman Shan ?Rikki Spearing ?Weeks in Treatment: 0 ?Edema Assessment ?Assessed: [Left: Yes] [Right: No] ?Edema: [Left: Ye] [Right: s] ?Calf ?Left: Right: ?Point of Measurement: 29 cm From Medial Instep 47.5 cm ?Ankle ?Left: Right: ?Point of Measurement: 7 cm From Medial Instep 29.5 cm ?Knee To Floor ?Left: Right: ?From Medial Instep 40 cm ?Vascular Assessment ?Pulses: ?Dorsalis Pedis ?Palpable: [Left:Yes] ?Notes ?Unable to obtain ABI, pt could not handle pain ?Electronic Signature(s) ?Signed: 12/25/2021 5:06:01 PM By: Nancy Creamer RN, BSN ?Entered By: Nancy Hinton on 12/25/2021 09:35:47 ?-------------------------------------------------------------------------------- ?Multi Wound Chart Details ?Patient Name: ?Date of Service: ?[name], Nancy RIA D. 12/25/2021 9:00 A M ?Medical Record Number: [id number] ?Patient Account Number: [id number] ?Date of Birth/Sex: ?Treating RN: ?1946/04/14 (76 y.o. F) ?Primary Care Abayomi Pattison: Rikki Spearing ?Other Clinician: ?Referring Celester Morgan: ?Treating Rylend Pietrzak/Extender: Kalman Shan ?Rikki Spearing ?Weeks in Treatment: 0 ?Vital Signs ?Height(in): 62 ?Capillary Blood Glucose(mg/dl): 128 ?Weight(lbs): 214 ?Pulse(bpm): 52 ?Body Mass Index(BMI): 39.1 ?Blood Pressure(mmHg): 115/58 ?Temperature(??F): 98.1 ?Respiratory Rate(breaths/min): 20 ?Photos: [N/A:N/A] ?Left, Anterior Lower Leg N/A N/A ?Wound Location: ?Hematoma N/A N/A ?Wounding Event: ?Abrasion N/A N/A ?Primary Etiology: ?Lymphedema N/A N/A ?Secondary Etiology: ?Anemia, Arrhythmia, Hypertension, N/A N/A ?Comorbid History: ?Type II Diabetes, Osteoarthritis ?11/22/2021 N/A N/A ?Date Acquired: ?0 N/A N/A ?Weeks of Treatment: ?Open N/A N/A ?Wound Status: ?No N/A N/A ?Wound Recurrence: ?4.4x4.8x0.2 N/A N/A ?Measurements L x W x D (cm) ?16.588 N/A N/A ?A (cm?) : ?rea ?3.318 N/A N/A ?Volume (cm?) : ?0.00% N/A N/A ?% Reduction in A rea: ?0.00% N/A N/A ?% Reduction in Volume: ?Full Thickness Without Exposed N/A N/A ?Classification: ?Support  Structures ?Large N/A N/A ?Exudate A mount: ?Serous N/A N/A ?Exudate Type: ?amber N/A N/A ?Exudate Color: ?Distinct, outline attached N/A N/A ?Wound Margin: ?Medium (34-66%) N/A N/A ?Granulation A mount: ?Red, Pink N/A N/A

## 2021-12-25 ENCOUNTER — Encounter (HOSPITAL_BASED_OUTPATIENT_CLINIC_OR_DEPARTMENT_OTHER): Payer: Medicare HMO | Attending: Internal Medicine | Admitting: Internal Medicine

## 2021-12-25 ENCOUNTER — Other Ambulatory Visit: Payer: Self-pay

## 2021-12-25 DIAGNOSIS — E039 Hypothyroidism, unspecified: Secondary | ICD-10-CM | POA: Insufficient documentation

## 2021-12-25 DIAGNOSIS — I890 Lymphedema, not elsewhere classified: Secondary | ICD-10-CM | POA: Insufficient documentation

## 2021-12-25 DIAGNOSIS — I10 Essential (primary) hypertension: Secondary | ICD-10-CM | POA: Insufficient documentation

## 2021-12-25 DIAGNOSIS — M1990 Unspecified osteoarthritis, unspecified site: Secondary | ICD-10-CM | POA: Diagnosis not present

## 2021-12-25 DIAGNOSIS — I872 Venous insufficiency (chronic) (peripheral): Secondary | ICD-10-CM | POA: Insufficient documentation

## 2021-12-25 DIAGNOSIS — E11622 Type 2 diabetes mellitus with other skin ulcer: Secondary | ICD-10-CM | POA: Insufficient documentation

## 2021-12-25 DIAGNOSIS — L97822 Non-pressure chronic ulcer of other part of left lower leg with fat layer exposed: Secondary | ICD-10-CM | POA: Insufficient documentation

## 2021-12-25 NOTE — Progress Notes (Signed)
[place], Nancy Hinton ([id number]) ?Visit Report for 12/25/2021 ?Abuse Risk Screen Details ?Patient Name: Date of Service: ?[name], [name] 12/25/2021 9:00 A M ?Medical Record Number: [id number] ?Patient Account Number: [id number] ?Date of Birth/Sex: Treating RN: ?[birth year] (76 y.o. F) Hinton, Nancy ?Primary Care Mychael Smock: [facility]ri Other Clinician: ?Referring Heather Mckendree: ?Treating Makaiah Terwilliger/Extender: Geralyn Corwin ?[facility] ?Weeks in Treatment: 0 ?Abuse Risk Screen Items ?Answer ?ABUSE RISK SCREEN: ?Has anyone close to you tried to hurt or harm you recentlyo No ?Do you feel uncomfortable with anyone in your familyo No ?Has anyone forced you do things that you didnt want to doo No ?Electronic Signature(s) ?Signed: 12/25/2021 6:07:05 PM By: Shawn Stall RN, BSN ?Entered By: Shawn Stall on 12/25/2021 09:14:15 ?-------------------------------------------------------------------------------- ?Activities of Daily Living Details ?Patient Name: Date of Service: ?[name], [name] 12/25/2021 9:00 A M ?Medical Record Number: [id number] ?Patient Account Number: [id number] ?Date of Birth/Sex: Treating RN: ?Mar 12, 1946 (76 y.o. F) Hinton, Nancy ?Primary Care [place] Mullett: [facility]ri Other Clinician: ?Referring Islay Polanco: ?Treating Alisa Stjames/Extender: Geralyn Corwin ?[facility] ?Weeks in Treatment: 0 ?Activities of Daily Living Items ?Answer ?Activities of Daily Living (Please select one for each item) ?Drive Automobile Completely Able ?T Medications ?ake Completely Able ?Use T elephone Completely Able ?Care for Appearance Completely Able ?Use T oilet Completely Able ?Bath / Shower Completely Able ?Dress Self Completely Able ?Feed Self Completely Able ?Walk Completely Able ?Get In / Out Bed Completely Able ?Housework Completely Able ?Prepare Meals Completely Able ?Handle Money Completely Able ?Shop for Self Completely Able ?Electronic Signature(s) ?Signed: 12/25/2021 6:07:05 PM By: Shawn Stall RN, BSN ?Entered By: Shawn Stall on  12/25/2021 09:14:35 ?-------------------------------------------------------------------------------- ?Education Screening Details ?Patient Name: ?Date of Service: ?[name], [name] 12/25/2021 9:00 A M ?Medical Record Number: [id number] ?Patient Account Number: [id number] ?Date of Birth/Sex: ?Treating RN: ?1946/07/08 (76 y.o. F) Hinton, Nancy ?Primary Care Keeshawn Fakhouri: [facility]ri ?Other Clinician: ?Referring Lanard Arguijo: ?Treating Fernandez Kenley/Extender: Geralyn Corwin ?[facility] ?Weeks in Treatment: 0 ?Primary Learner Assessed: Patient ?Learning Preferences/Education Level/Primary Language ?Learning Preference: Explanation, Demonstration, Printed Material ?Highest Education Level: College or Above ?Preferred Language: English ?Cognitive Barrier ?Language Barrier: No ?Translator Needed: No ?Memory Deficit: No ?Emotional Barrier: No ?Cultural/Religious Beliefs Affecting Medical Care: No ?Physical Barrier ?Impaired Vision: Yes Glasses, reading ?Impaired Hearing: No ?Decreased Hand dexterity: No ?Knowledge/Comprehension ?Knowledge Level: High ?Comprehension Level: High ?Ability to understand written instructions: High ?Ability to understand verbal instructions: High ?Motivation ?Anxiety Level: Calm ?Cooperation: Cooperative ?Education Importance: Acknowledges Need ?Interest in Health Problems: Asks Questions ?Perception: Coherent ?Willingness to Engage in Self-Management High ?Activities: ?Readiness to Engage in Self-Management High ?Activities: ?Electronic Signature(s) ?Signed: 12/25/2021 6:07:05 PM By: Shawn Stall RN, BSN ?Entered By: Shawn Stall on 12/25/2021 09:14:59 ?-------------------------------------------------------------------------------- ?Fall Risk Assessment Details ?Patient Name: ?Date of Service: ?[name], [name] 12/25/2021 9:00 A M ?Medical Record Number: [id number] ?Patient Account Number: [id number] ?Date of Birth/Sex: ?Treating RN: ?29-Jun-1946 (76 y.o. F) Hinton, Nancy ?Primary Care Briyanna Billingham: [facility] ?Other Clinician: ?Referring Klover Priestly: ?Treating Millicent Blazejewski/Extender: Geralyn Corwin ?[facility] ?Weeks in Treatment: 0 ?Fall Risk Assessment Items ?Have you had 2 or more falls in the last 12 monthso 0 Yes ?Have you had any fall that resulted in injury in the last 12 monthso 0 Yes ?FALLS RISK SCREEN ?History of falling - immediate or within 3 months 0 No ?Secondary diagnosis (Do you have 2 or more medical diagnoseso) 0 No ?Ambulatory aid ?None/bed rest/wheelchair/nurse 0 Yes ?Crutches/cane/walker 0 No ?Furniture 0 No ?Intravenous therapy  Access/Saline/Heparin Lock 0 No ?Gait/Transferring ?Normal/ bed rest/ wheelchair 0 Yes ?Weak (short steps with or without shuffle, stooped but able to lift head while walking, may seek 0 No ?support from furniture) ?Impaired (short steps with shuffle, may have difficulty arising from chair, head down, impaired 0 No ?balance) ?Mental Status ?Oriented to own ability 0 Yes ?Electronic Signature(s) ?Signed: 12/25/2021 6:07:05 PM By: Shawn Stall RN, BSN ?Entered By: Shawn Stall on 12/25/2021 09:16:15 ?-------------------------------------------------------------------------------- ?Foot Assessment Details ?Patient Name: ?Date of Service: ?[name], [name] 12/25/2021 9:00 A M ?Medical Record Number: [id number] ?Patient Account Number: [id number] ?Date of Birth/Sex: ?Treating RN: ?Jan 06, 1946 (76 y.o. F) Hinton, Nancy ?Primary Care Kenzey Birkland: [facility] ?Other Clinician: ?Referring Barnet Benavides: ?Treating Wiliam Cauthorn/Extender: Geralyn Corwin ?[facility] ?Weeks in Treatment: 0 ?Foot Assessment Items ?Site Locations ?+ = Sensation present, - = Sensation absent, C = Callus, U = Ulcer ?R = Redness, W = Warmth, M = Maceration, PU = Pre-ulcerative lesion ?F = Fissure, S = Swelling, D = Dryness ?Assessment ?Right: Left: ?Other Deformity: No No ?Prior Foot Ulcer: No No ?Prior Amputation: No No ?Charcot Joint: No No ?Ambulatory Status: Ambulatory Without Help ?Gait: Steady ?Electronic  Signature(s) ?Signed: 12/25/2021 6:07:05 PM By: Shawn Stall RN, BSN ?Entered By: Shawn Stall on 12/25/2021 09:33:40 ?-------------------------------------------------------------------------------- ?Nutrition Risk Screening Details ?Patient Name: ?Date of Service: ?[name], [name] 12/25/2021 9:00 A M ?Medical Record Number: [id number] ?Patient Account Number: [id number] ?Date of Birth/Sex: ?Treating RN: ?01/28/1946 (76 y.o. F) Hinton, Nancy ?Primary Care Dandrae Kustra: [facility] ?Other Clinician: ?Referring Shevy Yaney: ?Treating Kimberle Stanfill/Extender: Geralyn Corwin ?[facility] ?Weeks in Treatment: 0 ?Height (in): 62 ?Weight (lbs): 214 ?Body Mass Index (BMI): 39.1 ?Nutrition Risk Screening Items ?Score Screening ?NUTRITION RISK SCREEN: ?I have an illness or condition that made me change the kind and/or amount of food I eat 2 Yes ?I eat fewer than two meals per day 0 No ?I eat few fruits and vegetables, or milk products 0 No ?I have three or more drinks of beer, liquor or wine almost every day 0 No ?I have tooth or mouth problems that make it hard for me to eat 0 No ?I don't always have enough money to buy the food I need 0 No ?I eat alone most of the time 0 No ?I take three or more different prescribed or over-the-counter drugs a day 1 Yes ?Without wanting to, I have lost or gained 10 pounds in the last six months 0 No ?I am not always physically able to shop, cook and/or feed myself 0 No ?Nutrition Protocols ?Good Risk Protocol ?Provide education on elevated blood ?Moderate Risk Protocol 0 sugars and impact on wound healing, ?as applicable ?High Risk Proctocol ?Risk Level: Moderate Risk ?Score: 3 ?Electronic Signature(s) ?Signed: 12/25/2021 6:07:05 PM By: Shawn Stall RN, BSN ?Entered By: Shawn Stall on 12/25/2021 09:16:40 ?

## 2021-12-25 NOTE — Progress Notes (Signed)
Bureau, CARON CARREON ([id number]) ?Visit Report for 12/25/2021 ?Chief Complaint Document Details ?Patient Name: Date of Service: ?[name], [name] 12/25/2021 9:00 A M ?Medical Record Number: [id number] ?Patient Account Number: [id number] ?Date of Birth/Sex: Treating RN: ?08-Jan-1946 (76 y.o. F) ?Primary Care Provider: Rikki Spearing Other Clinician: ?Referring Provider: ?Treating Provider/Extender: Kalman Shan ?Rikki Spearing ?Weeks in Treatment: 0 ?Information Obtained from: Patient ?Chief Complaint ?Left lower extremity wound ?Electronic Signature(s) ?Signed: 12/25/2021 10:08:03 AM By: Kalman Shan DO ?Entered By: Kalman Shan on 12/25/2021 09:59:59 ?-------------------------------------------------------------------------------- ?Debridement Details ?Patient Name: Date of Service: ?[name], [name] 12/25/2021 9:00 A M ?Medical Record Number: [id number] ?Patient Account Number: [id number] ?Date of Birth/Sex: Treating RN: ?[birth year] (76 y.o. F) [name], [name] ?Primary Care Provider: Rikki Spearing Other Clinician: ?Referring Provider: ?Treating Provider/Extender: Kalman Shan ?Rikki Spearing ?Weeks in Treatment: 0 ?Debridement Performed for Assessment: Wound #1 Left,Anterior Lower Leg ?Performed By: Physician Kalman Shan, DO ?Debridement Type: Debridement ?Level of Consciousness (Pre-procedure): Awake and Alert ?Pre-procedure Verification/Time Out Yes - 09:45 ?Taken: ?Start Time: 09:46 ?Pain Control: Lidocaine 5% topical ointment ?T Area Debrided (L x W): ?otal 4.4 (cm) x 4.8 (cm) = 21.12 (cm?) ?Tissue and other material debrided: ?Viable, Non-Viable, Slough, Subcutaneous, Skin: Dermis , Skin: Epidermis, Biofilm, Fibrin/Exudate, Slough ?Level: Skin/Subcutaneous Tissue ?Debridement Description: Excisional ?Instrument: Curette ?Bleeding: Minimum ?Hemostasis Achieved: Pressure ?End Time: 09:51 ?Procedural Pain: 1 ?Post Procedural Pain: 4 ?Response to Treatment: Procedure was tolerated well ?Level of Consciousness (Post-  Awake and Alert ?procedure): ?Post Debridement Measurements of Total Wound ?Length: (cm) 4.4 ?Width: (cm) 4.8 ?Depth: (cm) 1 ?Volume: (cm?) 16.588 ?Character of Wound/Ulcer Post Debridement: Requires Further Debridement ?Post Procedure Diagnosis ?Same as Pre-procedure ?Electronic Signature(s) ?Signed: 12/25/2021 10:08:03 AM By: Kalman Shan DO ?Signed: 12/25/2021 6:07:05 PM By: Deon Pilling RN, BSN ?Entered By: Deon Pilling on 12/25/2021 09:55:02 ?-------------------------------------------------------------------------------- ?HPI Details ?Patient Name: Date of Service: ?[name], [name] 12/25/2021 9:00 A M ?Medical Record Number: [id number] ?Patient Account Number: [id number] ?Date of Birth/Sex: Treating RN: ?July 27, 1946 (76 y.o. F) ?Primary Care Provider: Rikki Spearing Other Clinician: ?Referring Provider: ?Treating Provider/Extender: Kalman Shan ?Rikki Spearing ?Weeks in Treatment: 0 ?History of Present Illness ?HPI Description: Admission in 12/25/2021 ?Ms. Lulubelle Dillavou is a 76 year old female with a past medical history of essential hypertension, hypothyroidism and type 2 diabetes on oral agents with ?recent hemoglobin A1c of 7.4 that presents to the clinic for a 42-month history of open wound to her left lower extremity. She states that on January 1 she was ?putting a [place] in the freezer and it fell out and hit her leg. She developed a hematoma and this eventually ruptured. She visited her primary care physician ?for this issue and was prescribed Bactrim for cellulitis and mupirocin cream to use daily to the wound bed. She has pictures and showed them to me today. ?Over the past 2 months the wound has improved in size and appearance. She currently denies systemic signs of infection. She does not have/wear ?compression stockings. ?Electronic Signature(s) ?Signed: 12/25/2021 10:08:03 AM By: Kalman Shan DO ?Entered By: Kalman Shan on 12/25/2021  10:02:08 ?-------------------------------------------------------------------------------- ?Physical Exam Details ?Patient Name: Date of Service: ?[name], [name] 12/25/2021 9:00 A M ?Medical Record Number: [id number] ?Patient Account Number: [id number] ?Date of Birth/Sex: Treating RN: ?[birth year] (76 y.o. F) ?Primary Care Provider: Rikki Spearing Other Clinician: ?Referring Provider: ?Treating Provider/Extender: Kalman Shan ?Rikki Spearing ?Weeks in Treatment: 0 ?Constitutional ?respirations regular, non-labored and within target range for patient.Marland Kitchen ?Cardiovascular ?2+  dorsalis pedis/posterior tibialis pulses. ?Psychiatric ?pleasant and cooperative. ?Notes ?Left lower extremity: Wound to the anterior aspect with granulation tissue and nonviable tissue present. There are increased pockets of depth throughout the ?wound bed. Undermining noted at the 1 o'clock position. No signs of surrounding tissue infection. ?Electronic Signature(s) ?Signed: 12/25/2021 10:08:03 AM By: Kalman Shan DO ?Entered By: Kalman Shan on 12/25/2021 10:02:49 ?-------------------------------------------------------------------------------- ?Physician Orders Details ?Patient Name: ?Date of Service: ?[name], [name] 12/25/2021 9:00 A M ?Medical Record Number: [id number] ?Patient Account Number: [id number] ?Date of Birth/Sex: ?Treating RN: ?03/25/1946 (76 y.o. F) [name], [name] ?Primary Care Provider: Rikki Spearing ?Other Clinician: ?Referring Provider: ?Treating Provider/Extender: Kalman Shan ?Rikki Spearing ?Weeks in Treatment: 0 ?Verbal / Phone Orders: No ?Diagnosis Coding ?ICD-10 Coding ?Code Description ?[id number] Non-pressure chronic ulcer of other part of left lower leg with fat layer exposed ?E03.9 Hypothyroidism, unspecified ?E11.622 Type 2 diabetes mellitus with other skin ulcer ?I89.0 Lymphedema, not elsewhere classified ?Follow-up Appointments ?ppointment in 1 week. - Dr. Heber [place] and Tammi Klippel, Room 8 ?Return A ?Bathing/ Shower/ Hygiene ?May  shower with protection but do not get wound dressing(s) wet. ?Edema Control - Lymphedema / SCD / Other ?Elevate legs to the level of the heart or above for 30 minutes daily and/or when sitting, a frequency of: - 3-4 times a day throughout the day. ?Avoid standing for long periods of time. ?Exercise regularly ?Moisturize legs daily. - right leg every night before bed. ?Wound Treatment ?Wound #1 - Lower Leg Wound Laterality: Left, Anterior ?Cleanser: Soap and Water 1 x Per Week/30 Days ?Discharge Instructions: May shower and wash wound with dial antibacterial soap and water prior to dressing change. ?Cleanser: Wound Cleanser 1 x Per Week/30 Days ?Discharge Instructions: Cleanse the wound with wound cleanser prior to applying a clean dressing using gauze sponges, not tissue or cotton balls. ?Peri-Wound Care: Sween Lotion (Moisturizing lotion) 1 x Per Week/30 Days ?Discharge Instructions: Apply moisturizing lotion as directed ?Topical: Gentamicin 1 x Per Week/30 Days ?Discharge Instructions: apply directly to wound bed. ?Prim Dressing: Hydrofera Blue Classic Foam, 4x4 in 1 x Per Week/30 Days ?ary ?Discharge Instructions: Moisten with saline pack lightly into depth and over wound bed. ?Secondary Dressing: Woven Gauze Sponge, Non-Sterile 4x4 in 1 x Per Week/30 Days ?Discharge Instructions: Apply over primary dressing as directed. ?Secondary Dressing: ABD Pad, 8x10 1 x Per Week/30 Days ?Discharge Instructions: Apply over primary dressing as directed. ?Secondary Dressing: Zetuvit Plus 4x8 in 1 x Per Week/30 Days ?Discharge Instructions: Apply over primary dressing as directed. ?Compression Wrap: Kerlix Roll 4.5x3.1 (in/yd) 1 x Per Week/30 Days ?Discharge Instructions: Apply Kerlix and Coban compression as directed. ?Compression Wrap: Coban Self-Adherent Wrap 4x5 (in/yd) 1 x Per Week/30 Days ?Discharge Instructions: Apply over Kerlix as directed. ?Patient Medications ?llergies: codeine, Glucophage, Tylenol ?A ?Notifications  Medication Indication Start End ?lidocaine ?DOSE topical 5 % ointment - ointment topical applied only to wound with debridements in clinic. ?Electronic Signature(s) ?Signed: 12/25/2021 10:08:03 AM By: Kalman Shan DO ?Entered By: Kalman Shan on 12/25/2021

## 2021-12-31 ENCOUNTER — Encounter: Payer: Self-pay | Admitting: Physician Assistant

## 2021-12-31 ENCOUNTER — Ambulatory Visit: Payer: Medicare HMO | Admitting: Physician Assistant

## 2021-12-31 ENCOUNTER — Other Ambulatory Visit: Payer: Self-pay

## 2021-12-31 VITALS — BP 112/60 | HR 70 | Ht 64.0 in | Wt 205.2 lb

## 2021-12-31 DIAGNOSIS — I4729 Other ventricular tachycardia: Secondary | ICD-10-CM

## 2021-12-31 DIAGNOSIS — I4510 Unspecified right bundle-branch block: Secondary | ICD-10-CM | POA: Diagnosis not present

## 2021-12-31 DIAGNOSIS — R55 Syncope and collapse: Secondary | ICD-10-CM | POA: Diagnosis not present

## 2021-12-31 DIAGNOSIS — I10 Essential (primary) hypertension: Secondary | ICD-10-CM | POA: Diagnosis not present

## 2021-12-31 DIAGNOSIS — E1159 Type 2 diabetes mellitus with other circulatory complications: Secondary | ICD-10-CM

## 2021-12-31 DIAGNOSIS — I77810 Thoracic aortic ectasia: Secondary | ICD-10-CM

## 2021-12-31 DIAGNOSIS — I480 Paroxysmal atrial fibrillation: Secondary | ICD-10-CM

## 2021-12-31 DIAGNOSIS — I2510 Atherosclerotic heart disease of native coronary artery without angina pectoris: Secondary | ICD-10-CM

## 2021-12-31 LAB — BASIC METABOLIC PANEL
BUN/Creatinine Ratio: 15 (ref 12–28)
BUN: 14 mg/dL (ref 8–27)
CO2: 19 mmol/L — ABNORMAL LOW (ref 20–29)
Calcium: 8 mg/dL — ABNORMAL LOW (ref 8.7–10.3)
Chloride: 110 mmol/L — ABNORMAL HIGH (ref 96–106)
Creatinine, Ser: 0.93 mg/dL (ref 0.57–1.00)
Glucose: 160 mg/dL — ABNORMAL HIGH (ref 70–99)
Potassium: 4.5 mmol/L (ref 3.5–5.2)
Sodium: 141 mmol/L (ref 134–144)
eGFR: 64 mL/min/{1.73_m2} (ref >59)

## 2021-12-31 NOTE — Patient Instructions (Signed)
Medication Instructions:  ?Your physician recommends that you continue on your current medications as directed. Please refer to the Current Medication list given to you today. ? ?*If you need a refill on your cardiac medications before your next appointment, please call your pharmacy* ? ? ?Lab Work: ?TODAY: BMET ? ?If you have labs (blood work) drawn today and your tests are completely normal, you will receive your results only by: ?MyChart Message (if you have MyChart) OR ?A paper copy in the mail ?If you have any lab test that is abnormal or we need to change your treatment, we will call you to review the results. ? ? ?Follow-Up: ?At [facility], you and your health needs are our priority.  As part of our continuing mission to provide you with exceptional heart care, we have created designated Provider Care Teams.  These Care Teams include your primary Cardiologist (physician) and Advanced Practice Providers (APPs -  Physician Assistants and Nurse Practitioners) who all work together to provide you with the care you need, when you need it. ? ?We recommend signing up for the patient portal called "MyChart".  Sign up information is provided on this After Visit Summary.  MyChart is used to connect with patients for Virtual Visits (Telemedicine).  Patients are able to view lab/test results, encounter notes, upcoming appointments, etc.  Non-urgent messages can be sent to your provider as well.   ?To learn more about what you can do with MyChart, go to [email or web address].   ? ?Your next appointment:   ?6 month(s) ? ?The format for your next appointment:   ?In Person ? ?Provider:   ?Lesleigh Noe, MD    ?

## 2022-01-01 ENCOUNTER — Telehealth: Payer: Self-pay

## 2022-01-01 NOTE — Telephone Encounter (Signed)
Patient notified and verbalized understanding. Pt had no questions at this time. PCP copied.  ?

## 2022-01-01 NOTE — Telephone Encounter (Signed)
-----   Message from Dyann Kief, PA-C sent at 01/01/2022  4:56 AM EDT ----- ?Labs stable except glucose high-needs better control. Calcium low but higher than a month ago. F/u with PCP for this. No changes. thanks ?

## 2022-01-02 ENCOUNTER — Other Ambulatory Visit: Payer: Self-pay

## 2022-01-02 ENCOUNTER — Encounter (HOSPITAL_BASED_OUTPATIENT_CLINIC_OR_DEPARTMENT_OTHER): Payer: Medicare HMO | Admitting: Internal Medicine

## 2022-01-02 DIAGNOSIS — I890 Lymphedema, not elsewhere classified: Secondary | ICD-10-CM | POA: Diagnosis not present

## 2022-01-02 DIAGNOSIS — I10 Essential (primary) hypertension: Secondary | ICD-10-CM | POA: Diagnosis not present

## 2022-01-02 DIAGNOSIS — L97822 Non-pressure chronic ulcer of other part of left lower leg with fat layer exposed: Secondary | ICD-10-CM

## 2022-01-02 DIAGNOSIS — M1990 Unspecified osteoarthritis, unspecified site: Secondary | ICD-10-CM | POA: Diagnosis not present

## 2022-01-02 DIAGNOSIS — E11622 Type 2 diabetes mellitus with other skin ulcer: Secondary | ICD-10-CM | POA: Diagnosis not present

## 2022-01-02 DIAGNOSIS — E039 Hypothyroidism, unspecified: Secondary | ICD-10-CM | POA: Diagnosis not present

## 2022-01-02 DIAGNOSIS — I872 Venous insufficiency (chronic) (peripheral): Secondary | ICD-10-CM | POA: Diagnosis not present

## 2022-01-02 NOTE — Progress Notes (Signed)
Dilger, ALLURE IGOE ([id number]) ?Visit Report for 01/02/2022 ?Arrival Information Details ?Patient Name: Date of Service: ?Nancy Hinton, Nancy RIA D. 01/02/2022 8:15 A M ?Medical Record Number: [id number] ?Patient Account Number: [id number] ?Date of Birth/Sex: Treating RN: ?[birth year] (76 y.o. F) Nancy Hinton, Nancy Hinton ?Primary Care Emmert Roethler: Rikki Spearing Other Clinician: ?Referring Shyne Resch: ?Treating Shannell Mikkelsen/Extender: Kalman Shan ?Rikki Spearing ?Weeks in Treatment: 1 ?Visit Information History Since Last Visit ?Added or deleted any medications: No ?Patient Arrived: Ambulatory ?Any new allergies or adverse reactions: No ?Arrival Time: 08:15 ?Had a fall or experienced change in No ?Accompanied By: self ?activities of daily living that may affect ?Transfer Assistance: None ?risk of falls: ?Patient Identification Verified: Yes ?Signs or symptoms of abuse/neglect since last visito No ?Secondary Verification Process Completed: Yes ?Hospitalized since last visit: No ?Patient Requires Transmission-Based Precautions: No ?Implantable device outside of the clinic excluding No ?Patient Has Alerts: Yes ?cellular tissue based products placed in the center ?Patient Alerts: Patient on Blood Thinner since last visit: ?[id number] to obtain;pain Has Dressing in Place as Prescribed: Yes ?Has Compression in Place as Prescribed: Yes ?Pain Present Now: No ?Electronic Signature(s) ?Signed: 01/02/2022 12:50:46 PM By: Deon Pilling RN, BSN ?Entered By: Deon Pilling on 01/02/2022 08:25:17 ?-------------------------------------------------------------------------------- ?Encounter Discharge Information Details ?Patient Name: Date of Service: ?Nancy Hinton, Nancy RIA D. 01/02/2022 8:15 A M ?Medical Record Number: [id number] ?Patient Account Number: [id number] ?Date of Birth/Sex: Treating RN: ?1946/08/22 (76 y.o. F) Nancy Hinton, Nancy Hinton ?Primary Care Jacquelynn Friend: Rikki Spearing Other Clinician: ?Referring Kori Goins: ?Treating Rosaelena Kemnitz/Extender: Kalman Shan ?Rikki Spearing ?Weeks in Treatment:  1 ?Encounter Discharge Information Items Post Procedure Vitals ?Discharge Condition: Stable ?Temperature (F): 98.8 ?Ambulatory Status: Ambulatory ?Pulse (bpm): 51 ?Discharge Destination: Home ?Respiratory Rate (breaths/min): 20 ?Transportation: Private Auto ?Blood Pressure (mmHg): 139/60 ?Accompanied By: self ?Schedule Follow-up Appointment: Yes ?Clinical Summary of Care: ?Electronic Signature(s) ?Signed: 01/02/2022 12:50:46 PM By: Deon Pilling RN, BSN ?Entered By: Deon Pilling on 01/02/2022 08:53:02 ?-------------------------------------------------------------------------------- ?Lower Extremity Assessment Details ?Patient Name: ?Date of Service: ?Nancy Hinton, Nancy RIA D. 01/02/2022 8:15 A M ?Medical Record Number: [id number] ?Patient Account Number: [id number] ?Date of Birth/Sex: ?Treating RN: ?01-22-1946 (76 y.o. F) Nancy Hinton, Nancy Hinton ?Primary Care Maysun Meditz: Rikki Spearing ?Other Clinician: ?Referring Olliver Boyadjian: ?Treating Lilyona Richner/Extender: Kalman Shan ?Rikki Spearing ?Weeks in Treatment: 1 ?Edema Assessment ?Assessed: [Left: Yes] [Right: No] ?Edema: [Left: Ye] [Right: s] ?Calf ?Left: Right: ?Point of Measurement: 29 cm From Medial Instep 47.5 cm ?Ankle ?Left: Right: ?Point of Measurement: 7 cm From Medial Instep 29 cm ?Vascular Assessment ?Pulses: ?Dorsalis Pedis ?Palpable: [Left:Yes] ?Electronic Signature(s) ?Signed: 01/02/2022 12:50:46 PM By: Deon Pilling RN, BSN ?Entered By: Deon Pilling on 01/02/2022 08:26:09 ?-------------------------------------------------------------------------------- ?Multi Wound Chart Details ?Patient Name: ?Date of Service: ?Nancy Hinton, Nancy RIA D. 01/02/2022 8:15 A M ?Medical Record Number: [id number] ?Patient Account Number: [id number] ?Date of Birth/Sex: ?Treating RN: ?[birth year] (76 y.o. F) ?Primary Care Alfredo Spong: Rikki Spearing ?Other Clinician: ?Referring Ehren Berisha: ?Treating Dakarri Kessinger/Extender: Kalman Shan ?Rikki Spearing ?Weeks in Treatment: 1 ?Vital Signs ?Height(in): 62 ?Capillary Blood Glucose(mg/dl):  160 ?Weight(lbs): 214 ?Pulse(bpm): 51 ?Body Mass Index(BMI): 39.1 ?Blood Pressure(mmHg): 139/60 ?Temperature(??F): 98.8 ?Respiratory Rate(breaths/min): 20 ?Photos: [N/A:N/A] ?Left, Anterior Lower Leg N/A N/A ?Wound Location: ?Hematoma N/A N/A ?Wounding Event: ?Abrasion N/A N/A ?Primary Etiology: ?Lymphedema N/A N/A ?Secondary Etiology: ?Anemia, Arrhythmia, Hypertension, N/A N/A ?Comorbid History: ?Type II Diabetes, Osteoarthritis ?11/22/2021 N/A N/A ?Date Acquired: ?1 N/A N/A ?Weeks of Treatment: ?Open N/A N/A ?Wound Status: ?No N/A N/A ?Wound Recurrence: ?3.8x4.5x0.9 N/A N/A ?Measurements L x W x D (cm) ?13.43  N/A N/A ?A (cm?) : ?rea ?12.087 N/A N/A ?Volume (cm?) : ?19.00% N/A N/A ?% Reduction in A rea: ?-264.30% N/A N/A ?% Reduction in Volume: ?Full Thickness Without Exposed N/A N/A ?Classification: ?Support Structures ?Medium N/A N/A ?Exudate A mount: ?Serosanguineous N/A N/A ?Exudate Type: ?red, brown N/A N/A ?Exudate Color: ?Distinct, outline attached N/A N/A ?Wound Margin: ?Large (67-100%) N/A N/A ?Granulation A mount: ?Red, Pink N/A N/A ?Granulation Quality: ?Small (1-33%) N/A N/A ?Necrotic A mount: ?Fat Layer (Subcutaneous Tissue): Yes N/A N/A ?Exposed Structures: ?Fascia: No ?Tendon: No ?Muscle: No ?Joint: No ?Bone: No ?Small (1-33%) N/A N/A ?Epithelialization: ?Debridement - Excisional N/A N/A ?Debridement: ?Pre-procedure Verification/Time Out 08:40 N/A N/A ?Taken: ?Lidocaine 4% Topical Solution N/A N/A ?Pain Control: ?Subcutaneous, Slough N/A N/A ?Tissue Debrided: ?Skin/Subcutaneous Tissue N/A N/A ?Level: ?17.1 N/A N/A ?Debridement A (sq cm): ?rea ?Curette N/A N/A ?Instrument: ?Moderate N/A N/A ?Bleeding: ?Pressure N/A N/A ?Hemostasis A chieved: ?0 N/A N/A ?Procedural Pain: ?0 N/A N/A ?Post Procedural Pain: ?Procedure was tolerated well N/A N/A ?Debridement Treatment Response: ?3.8x4.5x0.9 N/A N/A ?Post Debridement Measurements L x ?W x D (cm) ?12.087 N/A N/A ?Post Debridement Volume: (cm?) ?islands of  Epithelialization noted. N/A N/A ?Assessment Notes: ?Debridement N/A N/A ?Procedures Performed: ?Treatment Notes ?Wound #1 (Lower Leg) Wound Laterality: Left, Anterior ?Cleanser ?Soap and Water ?Discharge Instruction: May shower and wash wound with dial antibacterial soap and water prior to dressing change. ?Wound Cleanser ?Discharge Instruction: Cleanse the wound with wound cleanser prior to applying a clean dressing using gauze sponges, not tissue or cotton balls. ?Peri-Wound Care ?Sween Lotion (Moisturizing lotion) ?Discharge Instruction: Apply moisturizing lotion as directed ?Topical ?Gentamicin ?Discharge Instruction: apply directly to wound bed. ?Primary Dressing ?Hydrofera Blue Classic Foam, 4x4 in ?Discharge Instruction: Moisten with saline pack lightly into depth and over wound bed. ?Secondary Dressing ?Woven Gauze Sponge, Non-Sterile 4x4 in ?Discharge Instruction: Apply over primary dressing as directed. ?ABD Pad, 8x10 ?Discharge Instruction: Apply over primary dressing as directed. ?Zetuvit Plus 4x8 in ?Discharge Instruction: Apply over primary dressing as directed. ?Secured With ?Compression Wrap ?Kerlix Roll 4.5x3.1 (in/yd) ?Discharge Instruction: Apply Kerlix and Coban compression as directed. ?Coban Self-Adherent Wrap 4x5 (in/yd) ?Discharge Instruction: Apply over Kerlix as directed. ?Compression Stockings ?Add-Ons ?Electronic Signature(s) ?Signed: 01/02/2022 10:53:34 AM By: Kalman Shan DO ?Entered By: Kalman Shan on 01/02/2022 09:32:19 ?-------------------------------------------------------------------------------- ?Multi-Disciplinary Care Plan Details ?Patient Name: ?Date of Service: ?Nancy Hinton, Nancy RIA D. 01/02/2022 8:15 A M ?Medical Record Number: [id number] ?Patient Account Number: [id number] ?Date of Birth/Sex: ?Treating RN: ?August 30, 1946 (75 y.o. F) Nancy Hinton, Nancy Hinton ?Primary Care Maiyah Goyne: Rikki Spearing ?Other Clinician: ?Referring Delrick Dehart: ?Treating Monico Sudduth/Extender: Kalman Shan ?Rikki Spearing ?Weeks in Treatment: 1 ?Active Inactive ?Pain, Acute or Chronic ?Nursing Diagnoses: ?Pain, acute or chronic: actual or potential ?Potential alteration in comfort, pain ?Goals: ?Patient will verbalize adequ

## 2022-01-02 NOTE — Progress Notes (Signed)
Nancy Hinton ([id number]) ?Visit Report for 01/02/2022 ?Chief Complaint Document Details ?Patient Name: Date of Service: ?[name], [name] 01/02/2022 8:15 A M ?Medical Record Number: [id number] ?Patient Account Number: [id number] ?Date of Birth/Sex: Treating RN: ?[birth year] (76 y.o. F) ?Primary Care Provider: Hamilton Capri Other Clinician: ?Referring Provider: ?Treating Provider/Extender: Geralyn Corwin ?Hamilton Capri ?Weeks in Treatment: 1 ?Information Obtained from: Patient ?Chief Complaint ?Left lower extremity wound ?Electronic Signature(s) ?Signed: 01/02/2022 10:53:34 AM By: Geralyn Corwin DO ?Entered By: Geralyn Corwin on 01/02/2022 09:32:34 ?-------------------------------------------------------------------------------- ?Debridement Details ?Patient Name: Date of Service: ?[name], [name] 01/02/2022 8:15 A M ?Medical Record Number: [id number] ?Patient Account Number: [id number] ?Date of Birth/Sex: Treating RN: ?10-31-1945 (76 y.o. F) [name], [name] ?Primary Care Provider: Hamilton Capri Other Clinician: ?Referring Provider: ?Treating Provider/Extender: Geralyn Corwin ?Hamilton Capri ?Weeks in Treatment: 1 ?Debridement Performed for Assessment: Wound #1 Left,Anterior Lower Leg ?Performed By: Physician Geralyn Corwin, DO ?Debridement Type: Debridement ?Level of Consciousness (Pre-procedure): Awake and Alert ?Pre-procedure Verification/Time Out Yes - 08:40 ?Taken: ?Start Time: 08:41 ?Pain Control: Lidocaine 4% T opical Solution ?T Area Debrided (L x W): ?otal 3.8 (cm) x 4.5 (cm) = 17.1 (cm?) ?Tissue and other material debrided: ?Viable, Non-Viable, Slough, Subcutaneous, Skin: Dermis , Skin: Epidermis, Fibrin/Exudate, Slough ?Level: Skin/Subcutaneous Tissue ?Debridement Description: Excisional ?Instrument: Curette ?Bleeding: Moderate ?Hemostasis Achieved: Pressure ?End Time: 08:47 ?Procedural Pain: 0 ?Post Procedural Pain: 0 ?Response to Treatment: Procedure was tolerated well ?Level of Consciousness (Post- Awake and  Alert ?procedure): ?Post Debridement Measurements of Total Wound ?Length: (cm) 3.8 ?Width: (cm) 4.5 ?Depth: (cm) 0.9 ?Volume: (cm?) 12.087 ?Character of Wound/Ulcer Post Debridement: Improved ?Post Procedure Diagnosis ?Same as Pre-procedure ?Electronic Signature(s) ?Signed: 01/02/2022 10:53:34 AM By: Geralyn Corwin DO ?Signed: 01/02/2022 12:50:46 PM By: Shawn Stall RN, BSN ?Entered By: Shawn Stall on 01/02/2022 08:48:17 ?-------------------------------------------------------------------------------- ?HPI Details ?Patient Name: Date of Service: ?[name], [name] 01/02/2022 8:15 A M ?Medical Record Number: [id number] ?Patient Account Number: [id number] ?Date of Birth/Sex: Treating RN: ?09-03-1946 (76 y.o. F) ?Primary Care Provider: Hamilton Capri Other Clinician: ?Referring Provider: ?Treating Provider/Extender: Geralyn Corwin ?Hamilton Capri ?Weeks in Treatment: 1 ?History of Present Illness ?HPI Description: Admission in 12/25/2021 ?[name] is a 76 year old female with a past medical history of essential hypertension, hypothyroidism and type 2 diabetes on oral agents with ?recent hemoglobin A1c of 7.4 that presents to the clinic for a 71-month history of open wound to her left lower extremity. She states that on January 1 she was ?putting a [place] in the freezer and it fell out and hit her leg. She developed a hematoma and this eventually ruptured. She visited her primary care physician ?for this issue and was prescribed Bactrim for cellulitis and mupirocin cream to use daily to the wound bed. She has pictures and showed them to me today. ?Over the past 2 months the wound has improved in size and appearance. She currently denies systemic signs of infection. She does not have/wear ?compression stockings. ?3/24; patient presents for follow-up. She has no issues or complaints today. She tolerated the compression wrap well. She reports improvement in her chronic ?pain to the wound site. She denies signs of  infection. ?Electronic Signature(s) ?Signed: 01/02/2022 10:53:34 AM By: Geralyn Corwin DO ?Entered By: Geralyn Corwin on 01/02/2022 09:33:10 ?-------------------------------------------------------------------------------- ?Physical Exam Details ?Patient Name: Date of Service: ?[name], [name] 01/02/2022 8:15 A M ?Medical Record Number: [id number] ?Patient Account Number: [id number] ?Date of Birth/Sex: Treating RN: ?1946/06/18 (76 y.o.  F) ?Primary Care Provider: Hamilton Capri Other Clinician: ?Referring Provider: ?Treating Provider/Extender: Geralyn Corwin ?Hamilton Capri ?Weeks in Treatment: 1 ?Constitutional ?respirations regular, non-labored and within target range for patient.Marland Kitchen ?Cardiovascular ?2+ dorsalis pedis/posterior tibialis pulses. ?Psychiatric ?pleasant and cooperative. ?Notes ?Left lower extremity: Wound to the anterior aspect with granulation tissue and nonviable tissue present. There are increased pockets of depth throughout the ?wound bed. Undermining noted at the 1 o'clock position. No signs of surrounding tissue infection. ?Electronic Signature(s) ?Signed: 01/02/2022 10:53:34 AM By: Geralyn Corwin DO ?Entered By: Geralyn Corwin on 01/02/2022 09:34:59 ?-------------------------------------------------------------------------------- ?Physician Orders Details ?Patient Name: ?Date of Service: ?[name], [name] 01/02/2022 8:15 A M ?Medical Record Number: [id number] ?Patient Account Number: [id number] ?Date of Birth/Sex: ?Treating RN: ?April 03, 1946 (76 y.o. F) [name], [name] ?Primary Care Provider: Hamilton Capri ?Other Clinician: ?Referring Provider: ?Treating Provider/Extender: Geralyn Corwin ?Hamilton Capri ?Weeks in Treatment: 1 ?Verbal / Phone Orders: No ?Diagnosis Coding ?ICD-10 Coding ?Code Description ?H20.947 Non-pressure chronic ulcer of other part of left lower leg with fat layer exposed ?I89.0 Lymphedema, not elsewhere classified ?I87.2 Venous insufficiency (chronic) (peripheral) ?E11.622 Type 2 diabetes  mellitus with other skin ulcer ?E03.9 Hypothyroidism, unspecified ?Follow-up Appointments ?ppointment in 1 week. - Dr. Mikey Bussing and Yvonne Kendall, Room 8 Friday 01/09/2022 815am ?Return A ?Other: - Someone will call you with an appointment time with [facility] for Arterial studies- keep phone near. ?***Ensure to remove your wrap cover the wound with a dressing before your tests at the [facility] office.*** ?Bathing/ Shower/ Hygiene ?May shower with protection but do not get wound dressing(s) wet. ?Edema Control - Lymphedema / SCD / Other ?Elevate legs to the level of the heart or above for 30 minutes daily and/or when sitting, a frequency of: - 3-4 times a day throughout the day. ?Avoid standing for long periods of time. ?Exercise regularly ?Moisturize legs daily. - right leg every night before bed. ?Wound Treatment ?Wound #1 - Lower Leg Wound Laterality: Left, Anterior ?Cleanser: Soap and Water 1 x Per Week/30 Days ?Discharge Instructions: May shower and wash wound with dial antibacterial soap and water prior to dressing change. ?Cleanser: Wound Cleanser 1 x Per Week/30 Days ?Discharge Instructions: Cleanse the wound with wound cleanser prior to applying a clean dressing using gauze sponges, not tissue or cotton balls. ?Peri-Wound Care: Sween Lotion (Moisturizing lotion) 1 x Per Week/30 Days ?Discharge Instructions: Apply moisturizing lotion as directed ?Topical: Gentamicin 1 x Per Week/30 Days ?Discharge Instructions: apply directly to wound bed. ?Prim Dressing: Hydrofera Blue Classic Foam, 4x4 in 1 x Per Week/30 Days ?ary ?Discharge Instructions: Moisten with saline pack lightly into depth and over wound bed. ?Secondary Dressing: Woven Gauze Sponge, Non-Sterile 4x4 in 1 x Per Week/30 Days ?Discharge Instructions: Apply over primary dressing as directed. ?Secondary Dressing: ABD Pad, 8x10 1 x Per Week/30 Days ?Discharge Instructions: Apply over primary dressing as directed. ?Secondary Dressing: Zetuvit Plus  4x8 in 1 x Per Week/30 Days ?Discharge Instructions: Apply over primary dressing as directed. ?Compression Wrap: Kerlix Roll 4.5x3.1 (in/yd) 1 x Per Week/30 Days ?Discharge Instructions: Apply Kerlix and Coban c

## 2022-01-09 ENCOUNTER — Encounter (HOSPITAL_BASED_OUTPATIENT_CLINIC_OR_DEPARTMENT_OTHER): Payer: Medicare HMO | Admitting: Internal Medicine

## 2022-01-09 DIAGNOSIS — E11622 Type 2 diabetes mellitus with other skin ulcer: Secondary | ICD-10-CM | POA: Diagnosis not present

## 2022-01-09 DIAGNOSIS — L97822 Non-pressure chronic ulcer of other part of left lower leg with fat layer exposed: Secondary | ICD-10-CM

## 2022-01-09 DIAGNOSIS — E039 Hypothyroidism, unspecified: Secondary | ICD-10-CM | POA: Diagnosis not present

## 2022-01-09 DIAGNOSIS — I872 Venous insufficiency (chronic) (peripheral): Secondary | ICD-10-CM | POA: Diagnosis not present

## 2022-01-09 DIAGNOSIS — M1990 Unspecified osteoarthritis, unspecified site: Secondary | ICD-10-CM | POA: Diagnosis not present

## 2022-01-09 DIAGNOSIS — I10 Essential (primary) hypertension: Secondary | ICD-10-CM | POA: Diagnosis not present

## 2022-01-09 DIAGNOSIS — I890 Lymphedema, not elsewhere classified: Secondary | ICD-10-CM | POA: Diagnosis not present

## 2022-01-09 NOTE — Progress Notes (Signed)
Kornegay, SHERRELL HIGHAM ([id number]) ?Visit Report for 01/09/2022 ?Chief Complaint Document Details ?Patient Name: Date of Service: ?[name], [name] 01/09/2022 8:15 A M ?Medical Record Number: [id number] ?Patient Account Number: [id number] ?Date of Birth/Sex: Treating RN: ?Mar 11, 1946 (76 y.o. F) [name], [name] ?Primary Care Provider: Rikki Spearing Other Clinician: ?Referring Provider: ?Treating Provider/Extender: Kalman Shan ?Rikki Spearing ?Weeks in Treatment: 2 ?Information Obtained from: Patient ?Chief Complaint ?Left lower extremity wound ?Electronic Signature(s) ?Signed: 01/09/2022 9:18:07 AM By: Kalman Shan DO ?Entered By: Kalman Shan on 01/09/2022 09:13:20 ?-------------------------------------------------------------------------------- ?Debridement Details ?Patient Name: Date of Service: ?[name], [name] 01/09/2022 8:15 A M ?Medical Record Number: [id number] ?Patient Account Number: [id number] ?Date of Birth/Sex: Treating RN: ?1946/01/17 (76 y.o. F) [name], [name] ?Primary Care Provider: Rikki Spearing Other Clinician: ?Referring Provider: ?Treating Provider/Extender: Kalman Shan ?Rikki Spearing ?Weeks in Treatment: 2 ?Debridement Performed for Assessment: Wound #1 Left,Anterior Lower Leg ?Performed By: Physician Kalman Shan, DO ?Debridement Type: Debridement ?Level of Consciousness (Pre-procedure): Awake and Alert ?Pre-procedure Verification/Time Out Yes - 08:50 ?Taken: ?Start Time: 08:51 ?Pain Control: Lidocaine 5% topical ointment ?T Area Debrided (L x W): ?otal 3 (cm) x 4 (cm) = 12 (cm?) ?Tissue and other material debrided: Viable, Non-Viable, Slough, Subcutaneous, Fibrin/Exudate, Slough ?Level: Skin/Subcutaneous Tissue ?Debridement Description: Excisional ?Instrument: Curette ?Bleeding: Minimum ?Hemostasis Achieved: Pressure ?End Time: 08:54 ?Procedural Pain: 0 ?Post Procedural Pain: 0 ?Response to Treatment: Procedure was tolerated well ?Level of Consciousness (Post- Awake and Alert ?procedure): ?Post  Debridement Measurements of Total Wound ?Length: (cm) 3.5 ?Width: (cm) 4.3 ?Depth: (cm) 0.7 ?Volume: (cm?) 8.274 ?Character of Wound/Ulcer Post Debridement: Improved ?Post Procedure Diagnosis ?Same as Pre-procedure ?Electronic Signature(s) ?Signed: 01/09/2022 9:18:07 AM By: Kalman Shan DO ?Signed: 01/09/2022 1:08:47 PM By: Deon Pilling RN, BSN ?Entered By: Deon Pilling on 01/09/2022 08:54:34 ?-------------------------------------------------------------------------------- ?HPI Details ?Patient Name: Date of Service: ?[name], [name] 01/09/2022 8:15 A M ?Medical Record Number: [id number] ?Patient Account Number: [id number] ?Date of Birth/Sex: Treating RN: ?[birth year] (76 y.o. F) [name], [name] ?Primary Care Provider: Rikki Spearing Other Clinician: ?Referring Provider: ?Treating Provider/Extender: Kalman Shan ?Rikki Spearing ?Weeks in Treatment: 2 ?History of Present Illness ?HPI Description: Admission in 12/25/2021 ?Ms. Aireal Scheffler is a 76 year old female with a past medical history of essential hypertension, hypothyroidism and type 2 diabetes on oral agents with ?recent hemoglobin A1c of 7.4 that presents to the clinic for a 27-month history of open wound to her left lower extremity. She states that on January 1 she was ?putting a [place] in the freezer and it fell out and hit her leg. She developed a hematoma and this eventually ruptured. She visited her primary care physician ?for this issue and was prescribed Bactrim for cellulitis and mupirocin cream to use daily to the wound bed. She has pictures and showed them to me today. ?Over the past 2 months the wound has improved in size and appearance. She currently denies systemic signs of infection. She does not have/wear ?compression stockings. ?3/24; patient presents for follow-up. She has no issues or complaints today. She tolerated the compression wrap well. She reports improvement in her chronic ?pain to the wound site. She denies signs of infection. ?3/31;  patient presents for follow-up. She has tolerated the compression wrap well. She states that her ABIs have been scheduled for the end of next month. ?She has no issues or complaints today. She denies pain to the wound site. ?Electronic Signature(s) ?Signed: 01/09/2022 9:18:07 AM By: Kalman Shan DO ?Entered By: Kalman Shan  on 01/09/2022 09:14:01 ?-------------------------------------------------------------------------------- ?Physical Exam Details ?Patient Name: Date of Service: ?[name], [name] 01/09/2022 8:15 A M ?Medical Record Number: [id number] ?Patient Account Number: [id number] ?Date of Birth/Sex: Treating RN: ?1946-06-21 (76 y.o. F) [name], [name] ?Primary Care Provider: Rikki Spearing Other Clinician: ?Referring Provider: ?Treating Provider/Extender: Kalman Shan ?Rikki Spearing ?Weeks in Treatment: 2 ?Constitutional ?respirations regular, non-labored and within target range for patient.Marland Kitchen ?Cardiovascular ?2+ dorsalis pedis/posterior tibialis pulses. ?Psychiatric ?pleasant and cooperative. ?Notes ?Left lower extremity: Wound to the anterior aspect with granulation tissue and nonviable tissue. Increased pockets of depth around the 5 o'clock position. No ?undermining noted anymore. No signs of surrounding infection. [place] of epithelialization occurring to the center of the wound bed. ?Electronic Signature(s) ?Signed: 01/09/2022 9:18:07 AM By: Kalman Shan DO ?Entered By: Kalman Shan on 01/09/2022 09:15:19 ?-------------------------------------------------------------------------------- ?Physician Orders Details ?Patient Name: ?Date of Service: ?[name], [name] 01/09/2022 8:15 A M ?Medical Record Number: [id number] ?Patient Account Number: [id number] ?Date of Birth/Sex: ?Treating RN: ?[birth year] (76 y.o. 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 - Dr. Heber [place] and [facility] Room 9 Friday 01/16/2022 0915 ?Return A ?Other: - keep appointment time for your arterial studies [facility] on 02/02/2022. ?Bathing/ Shower/ Hygiene ?May shower with protection but do not get wound dressing(s) wet. ?Edema Control - Lymphedema / SCD / Other ?Elevate legs to the level of the heart or above for 30 minutes daily and/or when sitting, a frequency of: - 3-4 times a day throughout the day. ?Avoid standing for long periods of time. ?Exercise regularly ?Moisturize legs daily. - right leg every night before bed. ?Wound Treatment ?Wound #1 - Lower Leg Wound Laterality: Left, Anterior ?Cleanser: Soap and Water 1 x Per Week/30 Days ?Discharge Instructions: May shower and wash wound with dial antibacterial soap and water prior to dressing change. ?Cleanser: Wound Cleanser 1 x Per Week/30 Days ?Discharge Instructions: Cleanse the wound with wound cleanser prior to applying a clean dressing using gauze sponges, not tissue or cotton balls. ?Peri-Wound Care: Sween Lotion (Moisturizing lotion) 1 x Per Week/30 Days ?Discharge Instructions: Apply moisturizing lotion as directed ?Topical: Gentamicin 1 x Per Week/30 Days ?Discharge Instructions: apply directly to wound bed. ?Prim Dressing: Hydrofera Blue Classic Foam, 4x4 in 1 x Per Week/30 Days ?ary ?Discharge Instructions: Moisten with saline pack lightly into depth and over wound bed. ?Secondary Dressing: Woven Gauze Sponge, Non-Sterile 4x4 in 1 x Per Week/30 Days ?Discharge Instructions: Apply over primary dressing as directed. ?Secondary Dressing: ABD Pad, 8x10 1 x  Per Week/30 Days ?Discharge Instructions: Apply over primary dressing as directed. ?Secondary Dressing: Zetuvit Plus 4x8 in 1 x Per Week/30 Days ?Discharge Instructions: Apply over primary dressing as directed.

## 2022-01-09 NOTE — Progress Notes (Signed)
Gjerde, BRAD FALARDEAU ([id number]) ?Visit Report for 01/09/2022 ?Arrival Information Details ?Patient Name: Date of Service: ?[name], [name] 01/09/2022 8:15 A M ?Medical Record Number: [id number] ?Patient Account Number: [id number] ?Date of Birth/Sex: Treating RN: ?[birth year] (76 y.o. [name], Lauren ?Primary Care Sunshyne Horvath: Rikki Spearing Other Clinician: ?Referring Mizraim Harmening: ?Treating Hagan Vanauken/Extender: Kalman Shan ?Rikki Spearing ?Weeks in Treatment: 2 ?Visit Information History Since Last Visit ?Added or deleted any medications: No ?Patient Arrived: Ambulatory ?Any new allergies or adverse reactions: No ?Arrival Time: 08:22 ?Had a fall or experienced change in No ?Accompanied By: self ?activities of daily living that may affect ?Transfer Assistance: None ?risk of falls: ?Patient Identification Verified: Yes ?Signs or symptoms of abuse/neglect since last visito No ?Secondary Verification Process Completed: Yes ?Hospitalized since last visit: No ?Patient Requires Transmission-Based Precautions: No ?Implantable device outside of the clinic excluding No ?Patient Has Alerts: Yes ?cellular tissue based products placed in the center ?Patient Alerts: Patient on Blood Thinner since last visit: ?[id number] to obtain;pain Has Dressing in Place as Prescribed: Yes ?Pain Present Now: No ?Electronic Signature(s) ?Signed: 01/09/2022 12:49:17 PM By: Rhae Hammock RN ?Entered By: Rhae Hammock on 01/09/2022 08:23:01 ?-------------------------------------------------------------------------------- ?Encounter Discharge Information Details ?Patient Name: Date of Service: ?[name], [name] 01/09/2022 8:15 A M ?Medical Record Number: [id number] ?Patient Account Number: [id number] ?Date of Birth/Sex: Treating RN: ?1946/08/02 (76 y.o. F) [name], [name] ?Primary Care Brekyn Huntoon: Rikki Spearing Other Clinician: ?Referring Leticia Coletta: ?Treating Alicia Seib/Extender: Kalman Shan ?Rikki Spearing ?Weeks in Treatment: 2 ?Encounter Discharge Information  Items Post Procedure Vitals ?Discharge Condition: Stable ?Temperature (F): 98.1 ?Ambulatory Status: Ambulatory ?Pulse (bpm): 50 ?Discharge Destination: Home ?Respiratory Rate (breaths/min): 20 ?Transportation: Private Auto ?Blood Pressure (mmHg): 160/84 ?Accompanied By: self ?Schedule Follow-up Appointment: Yes ?Clinical Summary of Care: ?Electronic Signature(s) ?Signed: 01/09/2022 1:08:47 PM By: Deon Pilling RN, BSN ?Entered By: Deon Pilling on 01/09/2022 08:59:06 ?-------------------------------------------------------------------------------- ?Lower Extremity Assessment Details ?Patient Name: ?Date of Service: ?[name], [name] 01/09/2022 8:15 A M ?Medical Record Number: [id number] ?Patient Account Number: [id number] ?Date of Birth/Sex: ?Treating RN: ?10-30-1945 (76 y.o. [name], Lauren ?Primary Care Piper Albro: Rikki Spearing ?Other Clinician: ?Referring Kalven Ganim: ?Treating Ryley Teater/Extender: Kalman Shan ?Rikki Spearing ?Weeks in Treatment: 2 ?Edema Assessment ?Assessed: [Left: Yes] [Right: No] ?Edema: [Left: Ye] [Right: s] ?Calf ?Left: Right: ?Point of Measurement: 29 cm From Medial Instep 46 cm ?Ankle ?Left: Right: ?Point of Measurement: 7 cm From Medial Instep 29 cm ?Vascular Assessment ?Pulses: ?Dorsalis Pedis ?Palpable: [Left:Yes] ?Posterior Tibial ?Palpable: [Left:Yes] ?Electronic Signature(s) ?Signed: 01/09/2022 12:49:17 PM By: Rhae Hammock RN ?Entered By: Rhae Hammock on 01/09/2022 08:20:45 ?-------------------------------------------------------------------------------- ?Multi Wound Chart Details ?Patient Name: ?Date of Service: ?[name], [name] 01/09/2022 8:15 A M ?Medical Record Number: [id number] ?Patient Account Number: [id number] ?Date of Birth/Sex: ?Treating RN: ?[birth year] (76 y.o. F) [name], [name] ?Primary Care Jhada Risk: Rikki Spearing ?Other Clinician: ?Referring Chimene Salo: ?Treating Cortasia Screws/Extender: Kalman Shan ?Rikki Spearing ?Weeks in Treatment: 2 ?Vital Signs ?Height(in): 62 ?Capillary  Blood Glucose(mg/dl): 140 ?Weight(lbs): 214 ?Pulse(bpm): 50 ?Body Mass Index(BMI): 39.1 ?Blood Pressure(mmHg): 160/84 ?Temperature(??F): 98.1 ?Respiratory Rate(breaths/min): 17 ?Photos: [1:Left, Anterior Lower Leg] [N/A:N/A N/A] ?Wound Location: [1:Hematoma] [N/A:N/A] ?Wounding Event: [1:Abrasion] [N/A:N/A] ?Primary Etiology: [1:Lymphedema] [N/A:N/A] ?Secondary Etiology: [1:Anemia, Arrhythmia, Hypertension, N/A] ?Comorbid History: [1:Type II Diabetes, Osteoarthritis 11/22/2021] [N/A:N/A] ?Date Acquired: [1:2] [N/A:N/A] ?Weeks of Treatment: [1:Open] [N/A:N/A] ?Wound Status: [1:No] [N/A:N/A] ?Wound Recurrence: [1:3.5x4.3x0.7] [N/A:N/A] ?Measurements L x W x D (cm) [1:11.82] [N/A:N/A] ?A (cm?) : ?rea [1:8.274] [N/A:N/A] ?Volume (cm?) : [1:28.70%] [N/A:N/A] ?%  Reduction in A [1:rea: -149.40%] [N/A:N/A] ?% Reduction in Volume: [1:Full Thickness Without Exposed] [N/A:N/A] ?Classification: [1:Support Structures Medium] [N/A:N/A] ?Exudate A mount: [1:Serosanguineous] [N/A:N/A] ?Exudate Type: [1:red, brown] [N/A:N/A] ?Exudate Color: [1:Distinct, outline attached] [N/A:N/A] ?Wound Margin: [1:Large (67-100%)] [N/A:N/A] ?Granulation A mount: [1:Red, Pink] [N/A:N/A] ?Granulation Quality: [1:Small (1-33%)] [N/A:N/A] ?Necrotic A mount: ?[1:Fat Layer (Subcutaneous Tissue): Yes N/A] ?Exposed Structures: ?[1:Fascia: No Tendon: No Muscle: No Joint: No Bone: No Small (1-33%)] [N/A:N/A] ?Epithelialization: [1:Debridement - Excisional] [N/A:N/A] ?Debridement: ?Pre-procedure Verification/Time Out 08:50 [N/A:N/A] ?Taken: [1:Lidocaine 5% topical ointment] [N/A:N/A] ?Pain Control: [1:Subcutaneous, Slough] [N/A:N/A] ?Tissue Debrided: [1:Skin/Subcutaneous Tissue] [N/A:N/A] ?Level: [1:12] [N/A:N/A] ?Debridement A (sq cm): [1:rea Curette] [N/A:N/A] ?Instrument: [1:Minimum] [N/A:N/A] ?Bleeding: [1:Pressure] [N/A:N/A] ?Hemostasis A chieved: [1:0] [N/A:N/A] ?Procedural Pain: [1:0] [N/A:N/A] ?Post Procedural Pain: [1:Procedure was tolerated well]  [N/A:N/A] ?Debridement Treatment Response: [1:3.5x4.3x0.7] [N/A:N/A] ?Post Debridement Measurements L x ?W x D (cm) [1:8.274] [N/A:N/A] ?Post Debridement Volume: (cm?) [1:Debridement] [N/A:N/A] ?Treatment Notes ?Wound #1 (Lower Leg) Wound Laterality: Left, Anterior ?Cleanser ?Soap and Water ?Discharge Instruction: May shower and wash wound with dial antibacterial soap and water prior to dressing change. ?Wound Cleanser ?Discharge Instruction: Cleanse the wound with wound cleanser prior to applying a clean dressing using gauze sponges, not tissue or cotton balls. ?Peri-Wound Care ?Sween Lotion (Moisturizing lotion) ?Discharge Instruction: Apply moisturizing lotion as directed ?Topical ?Gentamicin ?Discharge Instruction: apply directly to wound bed. ?Primary Dressing ?Hydrofera Blue Classic Foam, 4x4 in ?Discharge Instruction: Moisten with saline pack lightly into depth and over wound bed. ?Secondary Dressing ?Woven Gauze Sponge, Non-Sterile 4x4 in ?Discharge Instruction: Apply over primary dressing as directed. ?ABD Pad, 8x10 ?Discharge Instruction: Apply over primary dressing as directed. ?Zetuvit Plus 4x8 in ?Discharge Instruction: Apply over primary dressing as directed. ?Secured With ?Compression Wrap ?Kerlix Roll 4.5x3.1 (in/yd) ?Discharge Instruction: Apply Kerlix and Coban compression as directed. ?Coban Self-Adherent Wrap 4x5 (in/yd) ?Discharge Instruction: Apply over Kerlix as directed. ?Compression Stockings ?Add-Ons ?Electronic Signature(s) ?Signed: 01/09/2022 9:18:07 AM By: Kalman Shan DO ?Signed: 01/09/2022 1:08:47 PM By: Deon Pilling RN, BSN ?Entered By: Kalman Shan on 01/09/2022 09:12:52 ?-------------------------------------------------------------------------------- ?Multi-Disciplinary Care Plan Details ?Patient Name: ?Date of Service: ?[name], [name] 01/09/2022 8:15 A M ?Medical Record Number: [id number] ?Patient Account Number: [id number] ?Date of Birth/Sex: ?Treating RN: ?04-Jul-1946  (76 y.o. F) [name], [name] ?Primary Care Ramina Hulet: Rikki Spearing ?Other Clinician: ?Referring Yoskar Murrillo: ?Treating Kaedynce Tapp/Extender: Kalman Shan ?Rikki Spearing ?Weeks in Treatment: 2 ?Active Inactive ?Pain, Acute o

## 2022-01-16 ENCOUNTER — Encounter (HOSPITAL_BASED_OUTPATIENT_CLINIC_OR_DEPARTMENT_OTHER): Payer: Medicare HMO | Attending: Internal Medicine | Admitting: Internal Medicine

## 2022-01-16 DIAGNOSIS — I872 Venous insufficiency (chronic) (peripheral): Secondary | ICD-10-CM | POA: Diagnosis not present

## 2022-01-16 DIAGNOSIS — Z09 Encounter for follow-up examination after completed treatment for conditions other than malignant neoplasm: Secondary | ICD-10-CM | POA: Diagnosis not present

## 2022-01-16 DIAGNOSIS — E039 Hypothyroidism, unspecified: Secondary | ICD-10-CM | POA: Diagnosis not present

## 2022-01-16 DIAGNOSIS — E11622 Type 2 diabetes mellitus with other skin ulcer: Secondary | ICD-10-CM | POA: Insufficient documentation

## 2022-01-16 DIAGNOSIS — I10 Essential (primary) hypertension: Secondary | ICD-10-CM | POA: Diagnosis not present

## 2022-01-16 DIAGNOSIS — I890 Lymphedema, not elsewhere classified: Secondary | ICD-10-CM | POA: Insufficient documentation

## 2022-01-16 DIAGNOSIS — L97822 Non-pressure chronic ulcer of other part of left lower leg with fat layer exposed: Secondary | ICD-10-CM | POA: Insufficient documentation

## 2022-01-16 DIAGNOSIS — G8929 Other chronic pain: Secondary | ICD-10-CM | POA: Diagnosis not present

## 2022-01-19 DIAGNOSIS — I890 Lymphedema, not elsewhere classified: Secondary | ICD-10-CM | POA: Diagnosis not present

## 2022-01-19 DIAGNOSIS — I872 Venous insufficiency (chronic) (peripheral): Secondary | ICD-10-CM | POA: Diagnosis not present

## 2022-01-19 DIAGNOSIS — S81802A Unspecified open wound, left lower leg, initial encounter: Secondary | ICD-10-CM | POA: Diagnosis not present

## 2022-01-19 DIAGNOSIS — I87312 Chronic venous hypertension (idiopathic) with ulcer of left lower extremity: Secondary | ICD-10-CM | POA: Diagnosis not present

## 2022-01-19 DIAGNOSIS — L97822 Non-pressure chronic ulcer of other part of left lower leg with fat layer exposed: Secondary | ICD-10-CM | POA: Diagnosis not present

## 2022-01-23 ENCOUNTER — Encounter (HOSPITAL_BASED_OUTPATIENT_CLINIC_OR_DEPARTMENT_OTHER): Payer: Medicare HMO | Admitting: Internal Medicine

## 2022-01-23 ENCOUNTER — Other Ambulatory Visit (HOSPITAL_COMMUNITY): Payer: Self-pay | Admitting: Family Medicine

## 2022-01-23 DIAGNOSIS — E11622 Type 2 diabetes mellitus with other skin ulcer: Secondary | ICD-10-CM | POA: Diagnosis not present

## 2022-01-23 DIAGNOSIS — Z09 Encounter for follow-up examination after completed treatment for conditions other than malignant neoplasm: Secondary | ICD-10-CM | POA: Diagnosis not present

## 2022-01-23 DIAGNOSIS — I890 Lymphedema, not elsewhere classified: Secondary | ICD-10-CM | POA: Diagnosis not present

## 2022-01-23 DIAGNOSIS — G8929 Other chronic pain: Secondary | ICD-10-CM | POA: Diagnosis not present

## 2022-01-23 DIAGNOSIS — E039 Hypothyroidism, unspecified: Secondary | ICD-10-CM | POA: Diagnosis not present

## 2022-01-23 DIAGNOSIS — L97822 Non-pressure chronic ulcer of other part of left lower leg with fat layer exposed: Secondary | ICD-10-CM | POA: Diagnosis not present

## 2022-01-23 DIAGNOSIS — L97901 Non-pressure chronic ulcer of unspecified part of unspecified lower leg limited to breakdown of skin: Secondary | ICD-10-CM

## 2022-01-23 DIAGNOSIS — I10 Essential (primary) hypertension: Secondary | ICD-10-CM | POA: Diagnosis not present

## 2022-01-23 DIAGNOSIS — I872 Venous insufficiency (chronic) (peripheral): Secondary | ICD-10-CM | POA: Diagnosis not present

## 2022-01-23 NOTE — Progress Notes (Signed)
Nancy Hinton, Nancy Hinton ([id number]) ?Visit Report for 01/23/2022 ?HPI Details ?Patient Name: Date of Service: ?[name], [name] 01/23/2022 8:45 A M ?Medical Record Number: [id number] ?Patient Account Number: [id number] ?Date of Birth/Sex: Treating RN: ?12-08-1945 (76 y.o. Nancy Hinton ?Primary Care Provider: Rikki Spearing Other Clinician: ?Referring Provider: ?Treating Provider/Extender: Linton Ham ?Rikki Spearing ?Weeks in Treatment: 4 ?History of Present Illness ?HPI Description: Admission in 12/25/2021 ?[name] is a 76 year old female with a past medical history of essential hypertension, hypothyroidism and type 2 diabetes on oral agents with ?recent hemoglobin A1c of 7.4 that presents to the clinic for a 25-month history of open wound to her left lower extremity. She states that on January 1 she was ?putting a [place] in the freezer and it fell out and hit her leg. She developed a hematoma and this eventually ruptured. She visited her primary care physician ?for this issue and was prescribed Bactrim for cellulitis and mupirocin cream to use daily to the wound bed. She has pictures and showed them to me today. ?Over the past 2 months the wound has improved in size and appearance. She currently denies systemic signs of infection. She does not have/wear ?compression stockings. ?3/24; patient presents for follow-up. She has no issues or complaints today. She tolerated the compression wrap well. She reports improvement in her chronic ?pain to the wound site. She denies signs of infection. ?3/31; patient presents for follow-up. She has tolerated the compression wrap well. She states that her ABIs have been scheduled for the end of next month. ?She has no issues or complaints today. She denies pain to the wound site. ?4/7; patient presents for follow-up. She reports that the compression wrap rolled down slightly causing a bruise. She has no issues to the wound bed. She ?denies signs of infection. ?4/14; 1 week  follow-up. Compression wrap stayed on all week. She has been using gent and Hydrofera Blue to the wound area which only has 1 small open ?area remaining better than last week. She gives an interesting history of longstanding edema in her legs in fact she gives a very impressive family history of ?this. Arterial studies next week. She has received her bilateral juxta lites ?Electronic Signature(s) ?Signed: 01/23/2022 12:21:32 PM By: Linton Ham MD ?Entered By: Linton Ham on 01/23/2022 09:31:15 ?-------------------------------------------------------------------------------- ?Physical Exam Details ?Patient Name: Date of Service: ?[name], [name] 01/23/2022 8:45 A M ?Medical Record Number: [id number] ?Patient Account Number: [id number] ?Date of Birth/Sex: Treating RN: ?1946-05-26 (76 y.o. Nancy Hinton ?Primary Care Provider: Rikki Spearing Other Clinician: ?Referring Provider: ?Treating Provider/Extender: Linton Ham ?Rikki Spearing ?Weeks in Treatment: 4 ?Constitutional ?Patient is hypertensive.. Pulse regular and within target range for patient.Marland Kitchen Respirations regular, non-labored and within target range.. Temperature is normal and ?within the target range for the patient.Marland Kitchen Appears in no distress. ?Notes ?Wound exam; left lower extremity. The vast majority of the wound surface here is fully epithelialized. She has a small open area medially but the base of this ?appears clean measuring smaller than last week. No debridement is required ?She has bilateral nonpitting edema with pantaloon deformities around the ankle this is characteristic of lymphedema ?Electronic Signature(s) ?Signed: 01/23/2022 12:21:32 PM By: Linton Ham MD ?Entered By: Linton Ham on 01/23/2022 09:33:03 ?-------------------------------------------------------------------------------- ?Physician Orders Details ?Patient Name: ?Date of Service: ?[name], [name] 01/23/2022 8:45 A M ?Medical Record Number: [id number] ?Patient Account  Number: [id number] ?Date of Birth/Sex: ?Treating RN: ?Oct 13, 1945 (76 y.o. Nancy Hinton ?  Primary Care Provider: Rikki Spearing ?Other Clinician: ?Referring Provider: ?Treating Provider/Extender: Linton Ham ?Rikki Spearing ?Weeks in Treatment: 4 ?Verbal / Phone Orders: No ?Diagnosis Coding ?Follow-up Appointments ?ppointment in 1 week. - Dr. Heber [place] and Leveda Anna, Room 7 ?Return A ?Other: - keep appointment time for your arterial studies [facility] on [facility]02[facility]2022. ?Bathing/ Shower/ Hygiene ?May shower with protection but do not get wound dressing(s) wet. ?Edema Control - Lymphedema / SCD / Other ?Elevate legs to the level of the heart or above for 30 minutes daily and/or when sitting, a frequency of: - 3-4 times a day throughout the day. ?Avoid standing for long periods of time. ?Exercise regularly ?Moisturize legs daily. - right leg every night before bed. ?Other Edema Control Orders/Instructions: - JuxtaLite compression wrap ordered from [facility]. Begin to use on right leg. ?Wound Treatment ?Wound #1 - Lower Leg Wound Laterality: Left, Anterior ?Cleanser: Soap and Water 1 x Per Week/30 Days ?Discharge Instructions: May shower and wash wound with dial antibacterial soap and water prior to dressing change. ?Cleanser: Wound Cleanser 1 x Per Week/30 Days ?Discharge Instructions: Cleanse the wound with wound cleanser prior to applying a clean dressing using gauze sponges, not tissue or cotton balls. ?Peri-Wound Care: Sween Lotion (Moisturizing lotion) 1 x Per Week/30 Days ?Discharge Instructions: Apply moisturizing lotion as directed ?Topical: Gentamicin 1 x Per Week/30 Days ?Discharge Instructions: apply directly to wound bed. ?Prim Dressing: Hydrofera Blue Classic Foam, 4x4 in 1 x Per Week/30 Days ?ary ?Discharge Instructions: Moisten with saline pack lightly into depth and over wound bed. ?Secondary Dressing: ABD Pad, 8x10 1 x Per Week/30 Days ?Discharge Instructions: Apply over primary dressing as directed. ?Secondary  Dressing: Woven Gauze Sponge, Non-Sterile 4x4 in 1 x Per Week/30 Days ?Discharge Instructions: Apply over primary dressing as directed. ?Secondary Dressing: Zetuvit Plus 4x8 in 1 x Per Week/30 Days ?Discharge Instructions: Apply over primary dressing as directed. ?Compression Wrap: Kerlix Roll 4.5x3.1 (in/yd) 1 x Per Week/30 Days ?Discharge Instructions: Apply Kerlix and Coban compression as directed. ?Compression Wrap: Coban Self-Adherent Wrap 4x5 (in/yd) 1 x Per Week/30 Days ?Discharge Instructions: Apply over Kerlix as directed. ?Compression Stockings: Circaid Juxta Lite Compression Wrap ?Left Leg Compression Amount: 30-40 mmHG ?Right Leg Compression Amount: 30-40 mmHG ?Discharge Instructions: Apply Circaid Juxta Lite Compression Wrap daily as instructed. Apply first thing in the morning, remove at ?night before bed. ?Electronic Signature(s) ?Signed: 01/23/2022 12:21:32 PM By: Linton Ham MD ?Signed: 01/23/2022 1:03:04 PM By: Lorrin Jackson ?Entered By: Lorrin Jackson on 01/23/2022 09:25:20 ?-------------------------------------------------------------------------------- ?Problem List Details ?Patient Name: ?Date of Service: ?[name], [name] 01/23/2022 8:45 A M ?Medical Record Number: [id number] ?Patient Account Number: [id number] ?Date of Birth/Sex: ?Treating RN: ?[birth year] (76 y.o. Nancy Hinton ?Primary Care Provider: Rikki Spearing ?Other Clinician: ?Referring Provider: ?Treating Provider/Extender: Linton Ham ?Rikki Spearing ?Weeks in Treatment: 4 ?Active Problems ?ICD-10 ?Encounter ?Code Description Active Date MDM ?Diagnosis ?[id number] Non-pressure chronic ulcer of other part of left lower leg with fat layer exposed 12/25/2021 No Yes ?I89.0 Lymphedema, not elsewhere classified 12/25/2021 No Yes ?I87.2 Venous insufficiency (chronic) (peripheral) 12/25/2021 No Yes ?E11.622 Type 2 diabetes mellitus with other skin ulcer 12/25/2021 No Yes ?E03.9 Hypothyroidism, unspecified 12/25/2021 No Yes ?Inactive  Problems ?Resolved Problems ?Electronic Signature(s) ?Signed: 01/23/2022 12:21:32 PM By: Linton Ham MD ?Entered By: Linton Ham on 01/23/2022 09:29:42 ?-----------------------------------------------------------------

## 2022-01-23 NOTE — Progress Notes (Signed)
Nancy Hinton, Nancy Hinton ([id number]) ?Visit Report for 01/23/2022 ?Arrival Information Details ?Patient Name: Date of Service: ?Nancy Hinton, Nancy RIA D. 01/23/2022 8:45 A M ?Medical Record Number: [id number] ?Patient Account Number: [id number] ?Date of Birth/Sex: Treating RN: ?1946-03-17 (76 y.o. Nancy Hinton ?Primary Care Ellicia Alix: Rikki Spearing Other Clinician: ?Referring Kaliope Quinonez: ?Treating Cathryn Gallery/Extender: Linton Ham ?Rikki Spearing ?Weeks in Treatment: 4 ?Visit Information History Since Last Visit ?Added or deleted any medications: No ?Patient Arrived: Ambulatory ?Any new allergies or adverse reactions: No ?Arrival Time: 09:02 ?Had a fall or experienced change in No ?Transfer Assistance: None ?activities of daily living that may affect ?Patient Identification Verified: Yes ?risk of falls: ?Secondary Verification Process Completed: Yes ?Signs or symptoms of abuse/neglect since last visito No ?Patient Requires Transmission-Based Precautions: No ?Hospitalized since last visit: No ?Patient Has Alerts: Yes ?Implantable device outside of the clinic excluding No ?Patient Alerts: Patient on Blood Thinner ?cellular tissue based products placed in the center ?[id number] to obtain;pain ?since last visit: ?Has Dressing in Place as Prescribed: Yes ?Has Compression in Place as Prescribed: Yes ?Pain Present Now: No ?Electronic Signature(s) ?Signed: 01/23/2022 1:03:04 PM By: Lorrin Jackson ?Entered By: Lorrin Jackson on 01/23/2022 09:02:26 ?-------------------------------------------------------------------------------- ?Clinic Level of Care Assessment Details ?Patient Name: Date of Service: ?Nancy Hinton, Nancy RIA D. 01/23/2022 8:45 A M ?Medical Record Number: [id number] ?Patient Account Number: [id number] ?Date of Birth/Sex: Treating RN: ?[birth year] (76 y.o. Nancy Hinton ?Primary Care Merikay Lesniewski: Rikki Spearing Other Clinician: ?Referring Deundre Thong: ?Treating Makaylee Spielberg/Extender: Linton Ham ?Rikki Spearing ?Weeks in Treatment: 4 ?Clinic Level of Care  Assessment Items ?TOOL 4 Quantity Score ?X- 1 0 ?Use when only an EandM is performed on FOLLOW-UP visit ?ASSESSMENTS - Nursing Assessment / Reassessment ?X- 1 10 ?Reassessment of Co-morbidities (includes updates in patient status) ?X- 1 5 ?Reassessment of Adherence to Treatment Plan ?ASSESSMENTS - Wound and Skin A ssessment / Reassessment ?X - Simple Wound Assessment / Reassessment - one wound 1 5 ?[]  - 0 ?Complex Wound Assessment / Reassessment - multiple wounds ?[]  - 0 ?Dermatologic / Skin Assessment (not related to wound area) ?ASSESSMENTS - Focused Assessment ?[]  - 0 ?Circumferential Edema Measurements - multi extremities ?[]  - 0 ?Nutritional Assessment / Counseling / Intervention ?[]  - 0 ?Lower Extremity Assessment (monofilament, tuning fork, pulses) ?[]  - 0 ?Peripheral Arterial Disease Assessment (using hand held doppler) ?ASSESSMENTS - Ostomy and/or Continence Assessment and Care ?[]  - 0 ?Incontinence Assessment and Management ?[]  - 0 ?Ostomy Care Assessment and Management (repouching, etc.) ?PROCESS - Coordination of Care ?[]  - 0 ?Simple Patient / Family Education for ongoing care ?X- 1 20 ?Complex (extensive) Patient / Family Education for ongoing care ?X- 1 10 ?Staff obtains Consents, Records, T Results / Process Orders ?est ?[]  - 0 ?Staff telephones HHA, Nursing Homes / Clarify orders / etc ?[]  - 0 ?Routine Transfer to another Facility (non-emergent condition) ?[]  - 0 ?Routine Hospital Admission (non-emergent condition) ?[]  - 0 ?New Admissions / Biomedical engineer / Ordering NPWT Apligraf, etc. ?, ?[]  - 0 ?Emergency Hospital Admission (emergent condition) ?[]  - 0 ?Simple Discharge Coordination ?[]  - 0 ?Complex (extensive) Discharge Coordination ?PROCESS - Special Needs ?[]  - 0 ?Pediatric / Minor Patient Management ?[]  - 0 ?Isolation Patient Management ?[]  - 0 ?Hearing / Language / Visual special needs ?[]  - 0 ?Assessment of Community assistance (transportation, D/C planning, etc.) ?[]  -  0 ?Additional assistance / Altered mentation ?[]  - 0 ?Support Surface(s) Assessment (bed, cushion, seat, etc.) ?INTERVENTIONS - Wound Cleansing / Measurement ?X - Simple  Wound Cleansing - one wound 1 5 ?[]  - 0 ?Complex Wound Cleansing - multiple wounds ?X- 1 5 ?Wound Imaging (photographs - any number of wounds) ?[]  - 0 ?Wound Tracing (instead of photographs) ?X- 1 5 ?Simple Wound Measurement - one wound ?[]  - 0 ?Complex Wound Measurement - multiple wounds ?INTERVENTIONS - Wound Dressings ?[]  - 0 ?Small Wound Dressing one or multiple wounds ?[]  - 0 ?Medium Wound Dressing one or multiple wounds ?X- 1 20 ?Large Wound Dressing one or multiple wounds ?[]  - 0 ?Application of Medications - topical ?[]  - 0 ?Application of Medications - injection ?INTERVENTIONS - Miscellaneous ?[]  - 0 ?External ear exam ?[]  - 0 ?Specimen Collection (cultures, biopsies, blood, body fluids, etc.) ?[]  - 0 ?Specimen(s) / Culture(s) sent or taken to Lab for analysis ?[]  - 0 ?Patient Transfer (multiple staff / Civil Service fast streamer / Similar devices) ?[]  - 0 ?Simple Staple / Suture removal (25 or less) ?[]  - 0 ?Complex Staple / Suture removal (26 or more) ?[]  - 0 ?Hypo / Hyperglycemic Management (close monitor of Blood Glucose) ?[]  - 0 ?Ankle / Brachial Index (ABI) - do not check if billed separately ?X- 1 5 ?Vital Signs ?Has the patient been seen at the hospital within the last three years: Yes ?Total Score: 90 ?Level Of Care: New/Established - Level 3 ?Electronic Signature(s) ?Signed: 01/23/2022 1:03:04 PM By: Lorrin Jackson ?Entered By: Lorrin Jackson on 01/23/2022 09:25:59 ?-------------------------------------------------------------------------------- ?Encounter Discharge Information Details ?Patient Name: Date of Service: ?Nancy Hinton, Nancy RIA D. 01/23/2022 8:45 A M ?Medical Record Number: [id number] ?Patient Account Number: [id number] ?Date of Birth/Sex: Treating RN: ?February 03, 1946 (76 y.o. Nancy Hinton ?Primary Care Bethzy Hauck: Rikki Spearing Other  Clinician: ?Referring Ronnisha Felber: ?Treating Bellamia Ferch/Extender: Linton Ham ?Rikki Spearing ?Weeks in Treatment: 4 ?Encounter Discharge Information Items ?Discharge Condition: Stable ?Ambulatory Status: Ambulatory ?Discharge Destination: Home ?Transportation: Private Auto ?Schedule Follow-up Appointment: Yes ?Clinical Summary of Care: Provided on 01/23/2022 ?Form Type Recipient ?Paper Patient Patient ?Electronic Signature(s) ?Signed: 01/23/2022 1:03:04 PM By: Lorrin Jackson ?Entered By: Lorrin Jackson on 01/23/2022 09:26:53 ?-------------------------------------------------------------------------------- ?Lower Extremity Assessment Details ?Patient Name: Date of Service: ?Nancy Hinton, Nancy RIA D. 01/23/2022 8:45 A M ?Medical Record Number: [id number] ?Patient Account Number: [id number] ?Date of Birth/Sex: Treating RN: ?1946/02/24 (76 y.o. Nancy Hinton ?Primary Care Marijean Montanye: Rikki Spearing Other Clinician: ?Referring Deirdre Gryder: ?Treating Janita Camberos/Extender: Linton Ham ?Rikki Spearing ?Weeks in Treatment: 4 ?Edema Assessment ?Assessed: [Left: Yes] [Right: No] ?Edema: [Left: Ye] [Right: s] ?Calf ?Left: Right: ?Point of Measurement: 29 cm From Medial Instep 47.5 cm ?Ankle ?Left: Right: ?Point of Measurement: 7 cm From Medial Instep 30 cm ?Vascular Assessment ?Pulses: ?Dorsalis Pedis ?Palpable: [Left:Yes] ?Electronic Signature(s) ?Signed: 01/23/2022 1:03:04 PM By: Lorrin Jackson ?Entered By: Lorrin Jackson on 01/23/2022 09:10:29 ?-------------------------------------------------------------------------------- ?Multi Wound Chart Details ?Patient Name: ?Date of Service: ?Nancy Hinton, Nancy RIA D. 01/23/2022 8:45 A M ?Medical Record Number: [id number] ?Patient Account Number: [id number] ?Date of Birth/Sex: ?Treating RN: ?10/30/1945 (76 y.o. Nancy Hinton ?Primary Care Tenelle Andreason: Rikki Spearing ?Other Clinician: ?Referring Jerrard Bradburn: ?Treating Shirleymae Hauth/Extender: Linton Ham ?Rikki Spearing ?Weeks in Treatment: 4 ?Vital Signs ?Height(in): 62 ?Pulse(bpm):  71 ?Weight(lbs): 214 ?Blood Pressure(mmHg): 153/62 ?Body Mass Index(BMI): 39.1 ?Temperature(??F): 98.7 ?Respiratory Rate(breaths/min): 18 ?Photos: [N/A:N/A] ?Left, Anterior Lower Leg N/A N/A ?Wound Location: ?Hematoma N/A N/A ?Wo

## 2022-01-30 ENCOUNTER — Encounter (HOSPITAL_BASED_OUTPATIENT_CLINIC_OR_DEPARTMENT_OTHER): Payer: Medicare HMO | Admitting: Internal Medicine

## 2022-01-30 DIAGNOSIS — I10 Essential (primary) hypertension: Secondary | ICD-10-CM | POA: Diagnosis not present

## 2022-01-30 DIAGNOSIS — L97822 Non-pressure chronic ulcer of other part of left lower leg with fat layer exposed: Secondary | ICD-10-CM | POA: Diagnosis not present

## 2022-01-30 DIAGNOSIS — Z09 Encounter for follow-up examination after completed treatment for conditions other than malignant neoplasm: Secondary | ICD-10-CM | POA: Diagnosis not present

## 2022-01-30 DIAGNOSIS — G8929 Other chronic pain: Secondary | ICD-10-CM | POA: Diagnosis not present

## 2022-01-30 DIAGNOSIS — I872 Venous insufficiency (chronic) (peripheral): Secondary | ICD-10-CM | POA: Diagnosis not present

## 2022-01-30 DIAGNOSIS — I890 Lymphedema, not elsewhere classified: Secondary | ICD-10-CM | POA: Diagnosis not present

## 2022-01-30 DIAGNOSIS — E039 Hypothyroidism, unspecified: Secondary | ICD-10-CM | POA: Diagnosis not present

## 2022-01-30 DIAGNOSIS — E11622 Type 2 diabetes mellitus with other skin ulcer: Secondary | ICD-10-CM | POA: Diagnosis not present

## 2022-01-30 NOTE — Progress Notes (Signed)
Nancy Hinton ([id number]) ?Visit Report for 01/30/2022 ?Arrival Information Details ?Patient Name: Date of Service: ?Nancy Hinton, Nancy RIA D. 01/30/2022 9:30 A M ?Medical Record Number: [id number] ?Patient Account Number: [id number] ?Date of Birth/Sex: Treating RN: ?Oct 16, 1945 (76 y.o. F) [name], [name] ?Primary Care Nancy Hinton: Nancy Hinton Other Clinician: ?Referring Nancy Hinton: ?Treating Nancy Hinton/Extender: Nancy Hinton ?Nancy Hinton ?Weeks in Treatment: 5 ?Visit Information History Since Last Visit ?Added or deleted any medications: No ?Patient Arrived: Nancy Hinton ?Any new allergies or adverse reactions: No ?Arrival Time: 09:30 ?Had a fall or experienced change in No ?Accompanied By: self ?activities of daily living that may affect ?Transfer Assistance: None ?risk of falls: ?Patient Identification Verified: Yes ?Signs or symptoms of abuse/neglect since last visito No ?Secondary Verification Process Completed: Yes ?Hospitalized since last visit: No ?Patient Requires Transmission-Based No ?Implantable device outside of the clinic excluding No ?Precautions: ?cellular tissue based products placed in the center ?Patient Has Alerts: Yes ?since last visit: ?Patient Alerts: Patient on Blood Thinner ?Has Dressing in Place as Prescribed: Yes ?[id number] to obtain;pain ?Has Compression in Place as Prescribed: Yes ?Pain Present Now: No ?Notes ?arterial studies are scheduled for Monday 03-01-22. ?Electronic Signature(s) ?Signed: 01/30/2022 3:45:50 PM By: Deon Pilling RN, BSN ?Entered By: Deon Pilling on 01/30/2022 09:54:30 ?-------------------------------------------------------------------------------- ?Clinic Level of Care Assessment Details ?Patient Name: Date of Service: ?Nancy Hinton, Nancy RIA D. 01/30/2022 9:30 A M ?Medical Record Number: [id number] ?Patient Account Number: [id number] ?Date of Birth/Sex: Treating RN: ?1946-04-29 (76 y.o. F) [name], [name] ?Primary Care Diarra Ceja: Nancy Hinton Other Clinician: ?Referring Lautaro Koral: ?Treating  Nancy Hinton/Extender: Nancy Hinton ?Nancy Hinton ?Weeks in Treatment: 5 ?Clinic Level of Care Assessment Items ?TOOL 4 Quantity Score ?X- 1 0 ?Use when only an EandM is performed on FOLLOW-UP visit ?ASSESSMENTS - Nursing Assessment / Reassessment ?X- 1 10 ?Reassessment of Co-morbidities (includes updates in patient status) ?X- 1 5 ?Reassessment of Adherence to Treatment Plan ?ASSESSMENTS - Wound and Skin A ssessment / Reassessment ?X - Simple Wound Assessment / Reassessment - one wound 1 5 ?[]  - 0 ?Complex Wound Assessment / Reassessment - multiple wounds ?[]  - 0 ?Dermatologic / Skin Assessment (not related to wound area) ?ASSESSMENTS - Focused Assessment ?[]  - 0 ?Circumferential Edema Measurements - multi extremities ?[]  - 0 ?Nutritional Assessment / Counseling / Intervention ?[]  - 0 ?Lower Extremity Assessment (monofilament, tuning fork, pulses) ?[]  - 0 ?Peripheral Arterial Disease Assessment (using hand held doppler) ?ASSESSMENTS - Ostomy and/or Continence Assessment and Care ?[]  - 0 ?Incontinence Assessment and Management ?[]  - 0 ?Ostomy Care Assessment and Management (repouching, etc.) ?PROCESS - Coordination of Care ?X - Simple Patient / Family Education for ongoing care 1 15 ?[]  - 0 ?Complex (extensive) Patient / Family Education for ongoing care ?X- 1 10 ?Staff obtains Consents, Records, T Results / Process Orders ?est ?[]  - 0 ?Staff telephones HHA, Nursing Homes / Clarify orders / etc ?[]  - 0 ?Routine Transfer to another Facility (non-emergent condition) ?[]  - 0 ?Routine Hospital Admission (non-emergent condition) ?[]  - 0 ?New Admissions / Biomedical engineer / Ordering NPWT Apligraf, etc. ?, ?[]  - 0 ?Emergency Hospital Admission (emergent condition) ?X- 1 10 ?Simple Discharge Coordination ?[]  - 0 ?Complex (extensive) Discharge Coordination ?PROCESS - Special Needs ?[]  - 0 ?Pediatric / Minor Patient Management ?[]  - 0 ?Isolation Patient Management ?[]  - 0 ?Hearing / Language / Visual special needs ?[]  -  0 ?Assessment of Community assistance (transportation, D/C planning, etc.) ?[]  - 0 ?Additional assistance / Altered mentation ?[]  - 0 ?Support Surface(s)  Assessment (bed, cushion, seat, etc.) ?INTERVENTIONS - Wound Cleansing / Measurement ?X - Simple Wound Cleansing - one wound 1 5 ?[]  - 0 ?Complex Wound Cleansing - multiple wounds ?[]  - 0 ?Wound Imaging (photographs - any number of wounds) ?[]  - 0 ?Wound Tracing (instead of photographs) ?[]  - 0 ?Simple Wound Measurement - one wound ?[]  - 0 ?Complex Wound Measurement - multiple wounds ?INTERVENTIONS - Wound Dressings ?[]  - 0 ?Small Wound Dressing one or multiple wounds ?[]  - 0 ?Medium Wound Dressing one or multiple wounds ?X- 1 20 ?Large Wound Dressing one or multiple wounds ?[]  - 0 ?Application of Medications - topical ?[]  - 0 ?Application of Medications - injection ?INTERVENTIONS - Miscellaneous ?[]  - 0 ?External ear exam ?[]  - 0 ?Specimen Collection (cultures, biopsies, blood, body fluids, etc.) ?[]  - 0 ?Specimen(s) / Culture(s) sent or taken to Lab for analysis ?[]  - 0 ?Patient Transfer (multiple staff / Civil Service fast streamer / Similar devices) ?[]  - 0 ?Simple Staple / Suture removal (25 or less) ?[]  - 0 ?Complex Staple / Suture removal (26 or more) ?[]  - 0 ?Hypo / Hyperglycemic Management (close monitor of Blood Glucose) ?[]  - 0 ?Ankle / Brachial Index (ABI) - do not check if billed separately ?X- 1 5 ?Vital Signs ?Has the patient been seen at the hospital within the last three years: Yes ?Total Score: 85 ?Level Of Care: New/Established - Level 3 ?Electronic Signature(s) ?Signed: 01/30/2022 3:45:50 PM By: Deon Pilling RN, BSN ?Entered By: Deon Pilling on 01/30/2022 09:55:55 ?-------------------------------------------------------------------------------- ?Encounter Discharge Information Details ?Patient Name: Date of Service: ?Nancy Hinton, Nancy RIA D. 01/30/2022 9:30 A M ?Medical Record Number: [id number] ?Patient Account Number: [id number] ?Date of Birth/Sex: Treating  RN: ?April 17, 1946 (76 y.o. F) [name], [name] ?Primary Care Arieal Cuoco: Nancy Hinton Other Clinician: ?Referring Shamona Wirtz: ?Treating Zamarah Ullmer/Extender: Nancy Hinton ?Nancy Hinton ?Weeks in Treatment: 5 ?Encounter Discharge Information Items ?Discharge Condition: Stable ?Ambulatory Status: Nancy Hinton ?Discharge Destination: Home ?Transportation: Private Auto ?Accompanied By: self ?Schedule Follow-up Appointment: Yes ?Clinical Summary of Care: ?Electronic Signature(s) ?Signed: 01/30/2022 3:45:50 PM By: Deon Pilling RN, BSN ?Entered By: Deon Pilling on 01/30/2022 09:55:31 ?-------------------------------------------------------------------------------- ?Patient/Caregiver Education Details ?Patient Name: Date of Service: ?Nancy Hinton, Nancy RIA D. 4/21/2023andnbsp9:30 A M ?Medical Record Number: [id number] ?Patient Account Number: [id number] ?Date of Birth/Gender: Treating RN: ?08/01/1946 (76 y.o. F) [name], [name] ?Primary Care Physician: Nancy Hinton Other Clinician: ?Referring Physician: ?Treating Physician/Extender: Nancy Hinton ?Nancy Hinton ?Weeks in Treatment: 5 ?Education Assessment ?Education Provided To: ?Patient ?Education Topics Provided ?Wound/Skin Impairment: ?Handouts: Skin Care Do's and Dont's ?Methods: Explain/Verbal ?Responses: Reinforcements needed ?Electronic Signature(s) ?Signed: 01/30/2022 3:45:50 PM By: Deon Pilling RN, BSN ?Entered By: Deon Pilling on 01/30/2022 09:55:22 ?-------------------------------------------------------------------------------- ?Wound Assessment Details ?Patient Name: ?Date of Service: ?Nancy Hinton, Nancy RIA D. 01/30/2022 9:30 A M ?Medical Record Number: [id number] ?Patient Account Number: [id number] ?Date of Birth/Sex: ?Treating RN: ?July 02, 1946 (76 y.o. F) [name], [name] ?Primary Care Amirra Herling: Nancy Hinton ?Other Clinician: ?Referring Briton Sellman: ?Treating Cathlyn Tersigni/Extender: Nancy Hinton ?Nancy Hinton ?Weeks in Treatment: 5 ?Wound Status ?Wound Number: 1 ?Primary Etiology: Abrasion ?Wound Location: Left,  Anterior Lower Leg ?Secondary Etiology: Lymphedema ?Wounding Event: Hematoma ?Wound Status: Open ?Date Acquired: 11/22/2021 ?Weeks Of Treatment: 5 ?Clustered Wound: No ?Wound Measurements ?Length: (cm) 1 ?Width: (cm) 0.8 ?Tennis Must

## 2022-01-30 NOTE — Progress Notes (Signed)
Nancy Hinton, Nancy Hinton ([id number]) ?Visit Report for 01/30/2022 ?SuperBill Details ?Patient Name: Date of Service: ?Nancy Hinton, Nancy Hinton. 01/30/2022 ?Medical Record Number: [id number] ?Patient Account Number: [id number] ?Date of Birth/Sex: Treating RN: ?1945/12/28 (76 y.o. F) [name], [name] ?Primary Care Provider: Rikki Spearing Other Clinician: ?Referring Provider: ?Treating Provider/Extender: Kalman Shan ?Rikki Spearing ?Weeks in Treatment: 5 ?Diagnosis Coding ?ICD-10 Codes ?Code Description ?[id number] Non-pressure chronic ulcer of other part of left lower leg with fat layer exposed ?I89.0 Lymphedema, not elsewhere classified ?I87.2 Venous insufficiency (chronic) (peripheral) ?E11.622 Type 2 diabetes mellitus with other skin ulcer ?E03.9 Hypothyroidism, unspecified ?Facility Procedures ?CPT4 Code Description Modifier Quantity ?[id number] - WOUND CARE VISIT-LEV 3 EST PT 1 ?Electronic Signature(s) ?Signed: 01/30/2022 3:45:50 PM By: Deon Pilling RN, BSN ?Signed: 01/30/2022 4:54:21 PM By: Kalman Shan DO ?Entered By: Deon Pilling on 01/30/2022 09:56:07 ?

## 2022-02-02 ENCOUNTER — Ambulatory Visit (HOSPITAL_COMMUNITY)
Admission: RE | Admit: 2022-02-02 | Discharge: 2022-02-02 | Disposition: A | Discharge status: Home / Self Care | Payer: Medicare HMO | Source: Ambulatory Visit | Attending: Cardiology | Admitting: Cardiology

## 2022-02-02 DIAGNOSIS — L97901 Non-pressure chronic ulcer of unspecified part of unspecified lower leg limited to breakdown of skin: Secondary | ICD-10-CM | POA: Diagnosis not present

## 2022-02-02 DIAGNOSIS — L97822 Non-pressure chronic ulcer of other part of left lower leg with fat layer exposed: Secondary | ICD-10-CM | POA: Diagnosis not present

## 2022-02-02 NOTE — Progress Notes (Signed)
Nancy Hinton, Nancy Hinton ([id number]) ?Visit Report for Hinton ?Arrival Information Details ?Patient Name: Date of Service: ?Nancy Hinton, Nancy Hinton ?Medical Record Number: [id number] ?Patient Account Number: [id number] ?Date of Birth/Sex: Treating RN: ?[birth year] (76 y.o. Nancy HintonPrimary Care Letroy Vazguez: Rikki Spearing Other Clinician: ?Referring Teressa Mcglocklin: ?Treating Lamarco Gudiel/Extender: Kalman Shan ?Rikki Spearing ?Weeks in Treatment: 3 ?Visit Information History Since Last Visit ?Added or deleted any medications: No ?Patient Arrived: Ambulatory ?Any new allergies or adverse reactions: No ?Arrival Time: 09:07 ?Had a fall or experienced change in No ?Accompanied By: self ?activities of daily living that may affect ?Transfer Assistance: None ?risk of falls: ?Patient Identification Verified: Yes ?Signs or symptoms of abuse/neglect since last visito No ?Secondary Verification Process Completed: Yes ?Hospitalized since last visit: No ?Patient Requires Transmission-Based Precautions: No ?Implantable device outside of the clinic excluding No ?Patient Has Alerts: Yes ?cellular tissue based products placed in the center ?Patient Alerts: Patient on Blood Thinner since last visit: ?[id number] to obtain;pain Has Dressing in Place as Prescribed: Yes ?Pain Present Now: Yes ?Electronic Signature(s) ?Signed: 01/16/2022 2:37:03 PM By: Sandre Kitty ?Entered By: Sandre Kitty on 01/16/2022 09:08:26 ?-------------------------------------------------------------------------------- ?Encounter Discharge Information Details ?Patient Name: Date of Service: ?Nancy Hinton, Nancy Hinton ?Medical Record Number: [id number] ?Patient Account Number: [id number] ?Date of Birth/Sex: Treating RN: ?[birth year] (76 y.o. Nancy Hinton ?Primary Care Kinsley Holderman: Rikki Spearing Other Clinician: ?Referring Lacy Taglieri: ?Treating Yocelyn Brocious/Extender: Kalman Shan ?Rikki Spearing ?Weeks in Treatment: 3 ?Encounter Discharge Information Items Post  Procedure Vitals ?Discharge Condition: Stable ?Temperature (F): 97.9 ?Ambulatory Status: Ambulatory ?Pulse (bpm): 60 ?Discharge Destination: Home ?Respiratory Rate (breaths/min): 17 ?Transportation: Private Auto ?Blood Pressure (mmHg): 150/69 ?Schedule Follow-up Appointment: Yes ?Clinical Summary of Care: Provided on Hinton ?Form Type Recipient ?Paper Patient Patient ?Electronic Signature(s) ?Signed: 01/16/2022 1:26:53 PM By: Lorrin Jackson ?Entered By: Lorrin Jackson on 01/16/2022 10:01:31 ?-------------------------------------------------------------------------------- ?Lower Extremity Assessment Details ?Patient Name: ?Date of Service: ?Nancy Hinton, Nancy Hinton ?Medical Record Number: [id number] ?Patient Account Number: [id number] ?Date of Birth/Sex: ?Treating RN: ?08/04/1946 (76 y.o. Nancy Hinton ?Primary Care Maury Groninger: Rikki Spearing ?Other Clinician: ?Referring Vibhav Waddill: ?Treating Merton Wadlow/Extender: Kalman Shan ?Rikki Spearing ?Weeks in Treatment: 3 ?Edema Assessment ?Assessed: [Left: Yes] [Right: Yes] ?Edema: [Left: Ye] [Right: s] ?Calf ?Left: Right: ?Point of Measurement: 29 cm From Medial Instep 51 cm 49.5 cm ?Ankle ?Left: Right: ?Point of Measurement: 7 cm From Medial Instep 31 cm 32 cm ?Knee To Floor ?Left: Right: ?From Medial Instep 38 cm 38 cm ?Vascular Assessment ?Pulses: ?Dorsalis Pedis ?Palpable: [Left:Yes] [Right:Yes] ?Electronic Signature(s) ?Signed: 01/16/2022 1:26:53 PM By: Lorrin Jackson ?Entered By: Lorrin Jackson on 01/16/2022 09:39:00 ?-------------------------------------------------------------------------------- ?Multi Wound Chart Details ?Patient Name: ?Date of Service: ?Nancy Hinton, Nancy Hinton ?Medical Record Number: [id number] ?Patient Account Number: [id number] ?Date of Birth/Sex: ?Treating RN: ?Feb 19, 1946 (76 y.o. Nancy HintonPrimary Care Delshawn Stech: Rikki Spearing ?Other Clinician: ?Referring Adonijah Baena: ?Treating Gene Colee/Extender: Kalman Shan ?Rikki Spearing ?Weeks in Treatment: 3 ?Vital Signs ?Height(in): 62 ?Pulse(bpm): 60 ?Weight(lbs): 214 ?Blood Pressure(mmHg): 150/69 ?Body Mass Index(BMI): 39.1 ?Temperature(??F): 97.9 ?Respiratory Rate(breaths/min): 17 ?Photos: [1:Left, Anterior Lower Leg] [N/A:N/A N/A] ?Wound Location: [1:Hematoma] [N/A:N/A] ?Wounding Event: [1:Abrasion] [N/A:N/A] ?Primary Etiology: [1:Lymphedema] [N/A:N/A] ?Secondary Etiology: [1:Anemia, Arrhythmia, Hypertension, N/A] ?Comorbid History: [1:Type II Diabetes, Osteoarthritis 11/22/2021] [N/A:N/A] ?Date Acquired: [1:3] [N/A:N/A] ?Weeks of Treatment: [1:Open] [N/A:N/A] ?Wound Status: [1:No] [N/A:N/A] ?Wound Recurrence: [1:3x3x0.5] [N/A:N/A] ?Measurements L x W x D (cm) [1:7.069] [N/A:N/A] ?A (  cm?) : ?rea [1:3.534] [N/A:N/A] ?Volume (cm?) : [1:57.40%] [N/A:N/A] ?% Reduction in A [1:rea: -6.50%] [N/A:N/A] ?% Reduction in Volume: [1:Full Thickness Without Exposed] [N/A:N/A] ?Classification: [1:Support Structures Medium] [N/A:N/A] ?Exudate A mount: [1:Serosanguineous] [N/A:N/A] ?Exudate Type: [1:red, brown] [N/A:N/A] ?Exudate Color: [1:Distinct, outline attached] [N/A:N/A] ?Wound Margin: [1:Large (67-100%)] [N/A:N/A] ?Granulation A mount: [1:Red, Pink] [N/A:N/A] ?Granulation Quality: [1:Small (1-33%)] [N/A:N/A] ?Necrotic A mount: ?[1:Fat Layer (Subcutaneous Tissue): Yes N/A] ?Exposed Structures: ?[1:Fascia: No Tendon: No Muscle: No Joint: No Bone: No Small (1-33%)] [N/A:N/A] ?Epithelialization: [1:Debridement - Excisional] [N/A:N/A] ?Debridement: ?Pre-procedure Verification/Time Out 09:29 [N/A:N/A] ?Taken: [1:Other] [N/A:N/A] ?Pain Control: [1:Subcutaneous, Slough] [N/A:N/A] ?Tissue Debrided: [1:Skin/Subcutaneous Tissue] [N/A:N/A] ?Level: [1:9] [N/A:N/A] ?Debridement A (sq cm): [1:rea Curette] [N/A:N/A] ?Instrument: [1:Minimum] [N/A:N/A] ?Bleeding: [1:Pressure] [N/A:N/A] ?Hemostasis A chieved: [1:Procedure was tolerated well] [N/A:N/A] ?Debridement Treatment Response: [1:3x3x0.5] [N/A:N/A] ?Post  Debridement Measurements L x ?W x D (cm) [1:3.534] [N/A:N/A] ?Post Debridement Volume: (cm?) [1:Debridement] [N/A:N/A] ?Treatment Notes ?Wound #1 (Lower Leg) Wound Laterality: Left, Anterior ?Cleanser ?Soap and Water ?Discharge Instruction: May shower and wash wound with dial antibacterial soap and water prior to dressing change. ?Wound Cleanser ?Discharge Instruction: Cleanse the wound with wound cleanser prior to applying a clean dressing using gauze sponges, not tissue or cotton balls. ?Peri-Wound Care ?Sween Lotion (Moisturizing lotion) ?Discharge Instruction: Apply moisturizing lotion as directed ?Topical ?Gentamicin ?Discharge Instruction: apply directly to wound bed. ?Primary Dressing ?Hydrofera Blue Classic Foam, 4x4 in ?Discharge Instruction: Moisten with saline pack lightly into depth and over wound bed. ?Secondary Dressing ?Woven Gauze Sponge, Non-Sterile 4x4 in ?Discharge Instruction: Apply over primary dressing as directed. ?ABD Pad, 8x10 ?Discharge Instruction: Apply over primary dressing as directed. ?Zetuvit Plus 4x8 in ?Discharge Instruction: Apply over primary dressing as directed. ?Secured With ?Compression Wrap ?Kerlix Roll 4.5x3.1 (in/yd) ?Discharge Instruction: Apply Kerlix and Coban compression as directed. ?Coban Self-Adherent Wrap 4x5 (in/yd) ?Discharge Instruction: Apply over Kerlix as directed. ?Compression Stockings ?Circaid Juxta Lite Compression Wrap ?Quantity: 1 ?Left Leg Compression Amount: 30-40 mmHg ?Right Leg Compression Amount: 30-40 mmHg ?Discharge Instruction: Apply Circaid Juxta Lite Compression Wrap daily as instructed. Apply first thing in the morning, remove at night before ?bed. ?Add-Ons ?Electronic Signature(s) ?Signed: 01/16/2022 11:10:25 AM By: Kalman Shan DO ?Signed: 02/02/2022 12:30:28 PM By: Rhae Hammock RN ?Entered By: Kalman Shan on 01/16/2022 10:57:21 ?-------------------------------------------------------------------------------- ?Multi-Disciplinary  Care Plan Details ?Patient Name: ?Date of Service: ?Nancy Hinton, Nancy Hinton ?Medical Record Number: [id number] ?Patient Account Number: [id number] ?Date of Birth/Sex: ?Treating RN: ?04/19/1946 (75 y.o

## 2022-02-02 NOTE — Progress Notes (Signed)
Nancy Hinton, Nancy Hinton ([id number]) ?Visit Report for 01/16/2022 ?Chief Complaint Document Details ?Patient Name: Date of Service: ?[name], [name] 01/16/2022 9:15 A M ?Medical Record Number: [id number] ?Patient Account Number: [id number] ?Date of Birth/Sex: Treating RN: ?April 17, 1946 (76 y.o. Nancy Hinton, Nancy Hinton ?Primary Care Provider: Rikki Spearing Other Clinician: ?Referring Provider: ?Treating Provider/Extender: Kalman Shan ?Rikki Spearing ?Weeks in Treatment: 3 ?Information Obtained from: Patient ?Chief Complaint ?Left lower extremity wound ?Electronic Signature(s) ?Signed: 01/16/2022 11:10:25 AM By: Kalman Shan DO ?Entered By: Kalman Shan on 01/16/2022 10:58:13 ?-------------------------------------------------------------------------------- ?Debridement Details ?Patient Name: Date of Service: ?[name], [name] 01/16/2022 9:15 A M ?Medical Record Number: [id number] ?Patient Account Number: [id number] ?Date of Birth/Sex: Treating RN: ?10/24/1945 (76 y.o. Nancy Hinton ?Primary Care Provider: Rikki Spearing Other Clinician: ?Referring Provider: ?Treating Provider/Extender: Kalman Shan ?Rikki Spearing ?Weeks in Treatment: 3 ?Debridement Performed for Assessment: Wound #1 Left,Anterior Lower Leg ?Performed By: Physician Kalman Shan, DO ?Debridement Type: Debridement ?Level of Consciousness (Pre-procedure): Awake and Alert ?Pre-procedure Verification/Time Out Yes - 09:29 ?Taken: ?Start Time: 09:30 ?Pain Control: ?Other : Benzocaine ?T Area Debrided (L x W): ?otal 3 (cm) x 3 (cm) = 9 (cm?) ?Tissue and other material debrided: Non-Viable, [place], Subcutaneous, [place] ?Level: Skin/Subcutaneous Tissue ?Debridement Description: Excisional ?Instrument: Curette ?Bleeding: Minimum ?Hemostasis Achieved: Pressure ?End Time: 09:36 ?Response to Treatment: Procedure was tolerated well ?Level of Consciousness (Post- Awake and Alert ?procedure): ?Post Debridement Measurements of Total Wound ?Length: (cm) 3 ?Width: (cm) 3 ?Depth:  (cm) 0.5 ?Volume: (cm?) 3.534 ?Character of Wound/Ulcer Post Debridement: Stable ?Post Procedure Diagnosis ?Same as Pre-procedure ?Electronic Signature(s) ?Signed: 01/16/2022 11:10:25 AM By: Kalman Shan DO ?Signed: 01/16/2022 1:26:53 PM By: Lorrin Jackson ?Entered By: Lorrin Jackson on 01/16/2022 09:36:24 ?-------------------------------------------------------------------------------- ?HPI Details ?Patient Name: Date of Service: ?[name], [name] 01/16/2022 9:15 A M ?Medical Record Number: [id number] ?Patient Account Number: [id number] ?Date of Birth/Sex: Treating RN: ?Jan 01, 1946 (76 y.o. Nancy Hinton, Nancy Hinton ?Primary Care Provider: Rikki Spearing Other Clinician: ?Referring Provider: ?Treating Provider/Extender: Kalman Shan ?Rikki Spearing ?Weeks in Treatment: 3 ?History of Present Illness ?HPI Description: Admission in 12/25/2021 ?[name] is a 76 year old female with a past medical history of essential hypertension, hypothyroidism and type 2 diabetes on oral agents with ?recent hemoglobin A1c of 7.4 that presents to the clinic for a 28-month history of open wound to her left lower extremity. She states that on January 1 she was ?putting a [place] in the freezer and it fell out and hit her leg. She developed a hematoma and this eventually ruptured. She visited her primary care physician ?for this issue and was prescribed Bactrim for cellulitis and mupirocin cream to use daily to the wound bed. She has pictures and showed them to me today. ?Over the past 2 months the wound has improved in size and appearance. She currently denies systemic signs of infection. She does not have/wear ?compression stockings. ?3/24; patient presents for follow-up. She has no issues or complaints today. She tolerated the compression wrap well. She reports improvement in her chronic ?pain to the wound site. She denies signs of infection. ?3/31; patient presents for follow-up. She has tolerated the compression wrap well. She states that  her ABIs have been scheduled for the end of next month. ?She has no issues or complaints today. She denies pain to the wound site. ?4/7; patient presents for follow-up. She reports that the compression wrap rolled down slightly causing a bruise. She has no issues to the wound bed. She ?  denies signs of infection. ?Electronic Signature(s) ?Signed: 01/16/2022 11:10:25 AM By: Kalman Shan DO ?Entered By: Kalman Shan on 01/16/2022 10:59:36 ?-------------------------------------------------------------------------------- ?Physical Exam Details ?Patient Name: Date of Service: ?[name], [name] 01/16/2022 9:15 A M ?Medical Record Number: [id number] ?Patient Account Number: [id number] ?Date of Birth/Sex: Treating RN: ?[birth year] (76 y.o. Nancy Hinton, Nancy Hinton ?Primary Care Provider: Rikki Spearing Other Clinician: ?Referring Provider: ?Treating Provider/Extender: Kalman Shan ?Rikki Spearing ?Weeks in Treatment: 3 ?Constitutional ?respirations regular, non-labored and within target range for patient.Marland Kitchen ?Cardiovascular ?2+ dorsalis pedis/posterior tibialis pulses. ?Psychiatric ?pleasant and cooperative. ?Notes ?Left lower extremity: Wound to the anterior aspect with granulation tissue, scant non-viable tissue and [place] of epithelization occurring. There is 1 area with ?increased depth. No signs of surrounding infection. ?Electronic Signature(s) ?Signed: 01/16/2022 11:10:25 AM By: Kalman Shan DO ?Entered By: Kalman Shan on 01/16/2022 11:02:07 ?-------------------------------------------------------------------------------- ?Physician Orders Details ?Patient Name: Date of Service: ?[name], [name] 01/16/2022 9:15 A M ?Medical Record Number: [id number] ?Patient Account Number: [id number] ?Date of Birth/Sex: Treating RN: ?[birth year] (76 y.o. 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 - Dr. Heber [place] and [facility] Room 9 ?Return A ?Other: - keep appointment time for your arterial studies [facility] on 02/02/2022. ?Bathing/ Shower/ Hygiene ?May shower with protection but do not get wound dressing(s) wet. ?Edema Control - Lymphedema / SCD / Other ?Elevate legs to the level of the heart or above for 30 minutes daily and/or when sitting, a frequency of: - 3-4 times a day throughout the day. ?Avoid standing for long periods of time. ?Exercise regularly ?Moisturize legs daily. - right leg every night before bed. ?Other Edema Control Orders/Instructions: - JuxtaLite compression wrap ordered from [facility]. Begin to use on right leg. ?Wound Treatment ?Wound #1 - Lower Leg Wound Laterality: Left, Anterior ?Cleanser: Soap and Water 1 x Per Week/30 Days ?Discharge Instructions: May shower and wash wound with dial antibacterial soap and water prior to dressing change. ?Cleanser: Wound Cleanser 1 x Per Week/30 Days ?Discharge Instructions: Cleanse the wound with wound cleanser prior to applying a clean dressing using gauze sponges, not tissue or cotton balls. ?Peri-Wound Care: Sween Lotion (Moisturizing lotion) 1 x Per Week/30 Days ?Discharge Instructions: Apply moisturizing lotion as directed ?Topical: Gentamicin 1 x Per Week/30 Days ?Discharge Instructions: apply directly to wound bed. ?Prim Dressing: Hydrofera Blue Classic Foam, 4x4 in 1 x Per Week/30 Days ?ary ?Discharge Instructions: Moisten with saline pack lightly into depth and over wound bed. ?Secondary Dressing: Woven Gauze Sponge, Non-Sterile 4x4 in 1 x Per Week/30  Days ?Discharge Instructions: Apply over primary dressing as directed. ?Secondary Dressing: ABD Pad, 8x10 1 x Per Week/30 Days ?Discharge Instructions: Apply over primary dressing as directed. ?Secondary Dr

## 2022-02-06 ENCOUNTER — Encounter (HOSPITAL_BASED_OUTPATIENT_CLINIC_OR_DEPARTMENT_OTHER): Payer: Medicare HMO | Admitting: Internal Medicine

## 2022-02-06 DIAGNOSIS — L97822 Non-pressure chronic ulcer of other part of left lower leg with fat layer exposed: Secondary | ICD-10-CM | POA: Diagnosis not present

## 2022-02-06 DIAGNOSIS — I890 Lymphedema, not elsewhere classified: Secondary | ICD-10-CM | POA: Diagnosis not present

## 2022-02-06 DIAGNOSIS — I10 Essential (primary) hypertension: Secondary | ICD-10-CM | POA: Diagnosis not present

## 2022-02-06 DIAGNOSIS — E039 Hypothyroidism, unspecified: Secondary | ICD-10-CM | POA: Diagnosis not present

## 2022-02-06 DIAGNOSIS — I872 Venous insufficiency (chronic) (peripheral): Secondary | ICD-10-CM | POA: Diagnosis not present

## 2022-02-06 DIAGNOSIS — Z09 Encounter for follow-up examination after completed treatment for conditions other than malignant neoplasm: Secondary | ICD-10-CM | POA: Diagnosis not present

## 2022-02-06 DIAGNOSIS — G8929 Other chronic pain: Secondary | ICD-10-CM | POA: Diagnosis not present

## 2022-02-06 DIAGNOSIS — E11622 Type 2 diabetes mellitus with other skin ulcer: Secondary | ICD-10-CM | POA: Diagnosis not present

## 2022-02-06 NOTE — Progress Notes (Signed)
Nancy Hinton ([id number]) ?Visit Report for 02/06/2022 ?HPI Details ?Patient Name: Date of Service: ?[name], [name] 02/06/2022 8:45 A M ?Medical Record Number: [id number] ?Patient Account Number: [id number] ?Date of Birth/Sex: Treating RN: ?08-Sep-1946 (76 y.o. Nancy Hinton ?Primary Care Provider: Rikki Spearing Other Clinician: ?Referring Provider: ?Treating Provider/Extender: Linton Ham ?Rikki Spearing ?Weeks in Treatment: 6 ?History of Present Illness ?HPI Description: Admission in 12/25/2021 ?Nancy Hinton is a 76 year old female with a past medical history of essential hypertension, hypothyroidism and type 2 diabetes on oral agents with ?recent hemoglobin A1c of 7.4 that presents to the clinic for a 50-month history of open wound to her left lower extremity. She states that on January 1 she was ?putting a [place] in the freezer and it fell out and hit her leg. She developed a hematoma and this eventually ruptured. She visited her primary care physician ?for this issue and was prescribed Bactrim for cellulitis and mupirocin cream to use daily to the wound bed. She has pictures and showed them to me today. ?Over the past 2 months the wound has improved in size and appearance. She currently denies systemic signs of infection. She does not have/wear ?compression stockings. ?3/24; patient presents for follow-up. She has no issues or complaints today. She tolerated the compression wrap well. She reports improvement in her chronic ?pain to the wound site. She denies signs of infection. ?3/31; patient presents for follow-up. She has tolerated the compression wrap well. She states that her ABIs have been scheduled for the end of next month. ?She has no issues or complaints today. She denies pain to the wound site. ?4/7; patient presents for follow-up. She reports that the compression wrap rolled down slightly causing a bruise. She has no issues to the wound bed. She ?denies signs of infection. ?4/14; 1 week  follow-up. Compression wrap stayed on all week. She has been using gent and Hydrofera Blue to the wound area which only has 1 small open ?area remaining better than last week. She gives an interesting history of longstanding edema in her legs in fact she gives a very impressive family history of ?this. Arterial studies next week. She has received her bilateral juxtallites ?Really4/28; the patient's wound is fully epithelialized. She has decent edemacontrol and kerlix Coban. She went on to have her arterial studies done show any ?problem on the right. Her ABI was not calculated however her TBI in the right was 0.95 triphasic waveforms. On the left the patient was not able to tolerate leg ?pressures. She had biphasic waveforms so they went on to do Dopplers of her entire legs which did not show any significant stenosis or plaque. Her TBI in the ?left was 1.06. Not felt to have any significant arterial issue ?Electronic Signature(s) ?Signed: 02/06/2022 4:01:11 PM By: Linton Ham MD ?Entered By: Linton Ham on 02/06/2022 09:31:00 ?-------------------------------------------------------------------------------- ?Physical Exam Details ?Patient Name: Date of Service: ?[name], [name] 02/06/2022 8:45 A M ?Medical Record Number: [id number] ?Patient Account Number: [id number] ?Date of Birth/Sex: Treating RN: ?[birth year] (76 y.o. Nancy Hinton ?Primary Care Provider: Rikki Spearing Other Clinician: ?Referring Provider: ?Treating Provider/Extender: Linton Ham ?Rikki Spearing ?Weeks in Treatment: 6 ?Constitutional ?Sitting or standing Blood Pressure is within target range for patient.. Pulse regular and within target range for patient.Marland Kitchen Respirations regular, non-labored and ?within target range.. Temperature is normal and within the target range for the patient.Marland Kitchen Appears in no distress. ?Notes ?Wound exam left lower extremity is completely epithelialized. Her  edema control is marginal this is really quite significant  lymphedema and she tells me again ?about her significant family history. ?Electronic Signature(s) ?Signed: 02/06/2022 4:01:11 PM By: Linton Ham MD ?Entered By: Linton Ham on 02/06/2022 09:33:07 ?-------------------------------------------------------------------------------- ?Physician Orders Details ?Patient Name: ?Date of Service: ?[name], [name] 02/06/2022 8:45 A M ?Medical Record Number: [id number] ?Patient Account Number: [id number] ?Date of Birth/Sex: ?Treating RN: ?Jan 19, 1946 (76 y.o. Nancy Hinton ?Primary Care Provider: Rikki Spearing ?Other Clinician: ?Referring Provider: ?Treating Provider/Extender: Linton Ham ?Rikki Spearing ?Weeks in Treatment: 6 ?Verbal / Phone Orders: No ?Diagnosis Coding ?Discharge From [facility] ?Discharge from [facility] - No further follow up needed, call if any issues. ?Edema Control - Lymphedema / SCD / Other ?Avoid standing for long periods of time. ?Patient to wear own compression stockings every day. - Juxtalite Compression Wrap: apply first thing in the morning and remove at bedtime. ?Moisturize legs daily. ?Non Wound Condition ?Protect area with: - May protect newly healed area with dry dressing. ?Electronic Signature(s) ?Signed: 02/06/2022 11:51:28 AM By: Lorrin Jackson ?Signed: 02/06/2022 4:01:11 PM By: Linton Ham MD ?Entered By: Lorrin Jackson on 02/06/2022 09:25:11 ?-------------------------------------------------------------------------------- ?Problem List Details ?Patient Name: ?Date of Service: ?[name], [name] 02/06/2022 8:45 A M ?Medical Record Number: [id number] ?Patient Account Number: [id number] ?Date of Birth/Sex: ?Treating RN: ?1945-11-20 (76 y.o. Nancy Hinton ?Primary Care Provider: Rikki Spearing ?Other Clinician: ?Referring Provider: ?Treating Provider/Extender: Linton Ham ?Rikki Spearing ?Weeks in Treatment: 6 ?Active Problems ?ICD-10 ?Encounter ?Code Description Active Date MDM ?Diagnosis ?[id number] Non-pressure chronic ulcer of  other part of left lower leg with fat layer exposed 12/25/2021 No Yes ?I89.0 Lymphedema, not elsewhere classified 12/25/2021 No Yes ?I87.2 Venous insufficiency (chronic) (peripheral) 12/25/2021 No Yes ?E11.622 Type 2 diabetes mellitus with other skin ulcer 12/25/2021 No Yes ?E03.9 Hypothyroidism, unspecified 12/25/2021 No Yes ?Inactive Problems ?Resolved Problems ?Electronic Signature(s) ?Signed: 02/06/2022 4:01:11 PM By: Linton Ham MD ?Entered By: Linton Ham on 02/06/2022 [id number] ?-------------------------------------------------------------------------------- ?Progress Note Details ?Patient Name: Date of Service: ?[name], [name] 02/06/2022 8:45 A M ?Medical Record Number: [id number] ?Patient Account Number: [id number] ?Date of Birth/Sex: Treating RN: ?09-11-1946 (76 y.o. Nancy Hinton ?Primary Care Provider: Rikki Spearing Other Clinician: ?Referring Provider: ?Treating Provider/Extender: Linton Ham ?Rikki Spearing ?Weeks in Treatment: 6 ?Subjective ?History of Present Illness (HPI) ?Admission in 12/25/2021 ?Nancy Hinton is a 76 year old female with a past medical history of essential hypertension, hypothyroidism and type 2 diabetes on oral agents with ?recent hemoglobin A1c of 7.4 that presents to the clinic for a 65-month history of open wound to her left lower extremity. She states that on January 1 she was ?putting a [place] in the freezer and it fell out and hit her leg. She developed a hematoma and this eventually ruptured. She visited her primary care physician ?for this issue and was prescribed Bactrim for cellulitis and mupirocin cream to use daily to the wound bed. She has pictures and showed them to me today. ?Over the past 2 months the wound has improved in size and appearance. She currently denies systemic signs of infection. She does not have/wear ?compression stockings. ?3/24; patient presents for follow-up. She has no issues or complaints today. She tolerated the compression wrap well. She  reports improvement in her chronic ?pain to the wound site. She denies signs of infection. ?3/31; patient presents for follow-up. She has tolerated the compression wrap well. She states that her ABIs have been sched

## 2022-02-06 NOTE — Progress Notes (Signed)
Dominique, Nancy GEARHEART ([id number]) ?Visit Report for 02/06/2022 ?Arrival Information Details ?Patient Name: Date of Service: ?[name], [name] 02/06/2022 8:45 A M ?Medical Record Number: [id number] ?Patient Account Number: [id number] ?Date of Birth/Sex: Treating RN: ?June 28, 1946 (76 y.o. Sue Hinton ?Primary Care Judia Arnott: Rikki Spearing Other Clinician: ?Referring Hartleigh Edmonston: ?Treating Francis Yardley/Extender: Linton Ham ?Rikki Spearing ?Weeks in Treatment: 6 ?Visit Information History Since Last Visit ?Added or deleted any medications: No ?Patient Arrived: Ambulatory ?Any new allergies or adverse reactions: No ?Arrival Time: 09:01 ?Had a fall or experienced change in No ?Transfer Assistance: None ?activities of daily living that may affect ?Patient Identification Verified: Yes ?risk of falls: ?Secondary Verification Process Completed: Yes ?Signs or symptoms of abuse/neglect since last visito No ?Patient Requires Transmission-Based Precautions: No ?Hospitalized since last visit: No ?Patient Has Alerts: Yes ?Implantable device outside of the clinic excluding No ?Patient Alerts: Patient on Blood Thinner ?cellular tissue based products placed in the center ?[id number] to obtain;pain ?since last visit: ?Has Dressing in Place as Prescribed: Yes ?Pain Present Now: No ?Electronic Signature(s) ?Signed: 02/06/2022 11:51:28 AM By: Lorrin Jackson ?Entered By: Lorrin Jackson on 02/06/2022 09:04:19 ?-------------------------------------------------------------------------------- ?Clinic Level of Care Assessment Details ?Patient Name: Date of Service: ?[name], [name] 02/06/2022 8:45 A M ?Medical Record Number: [id number] ?Patient Account Number: [id number] ?Date of Birth/Sex: Treating RN: ?December 26, 1945 (76 y.o. Sue Hinton ?Primary Care Lyndle Pang: Rikki Spearing Other Clinician: ?Referring Lakynn Halvorsen: ?Treating Talin Rozeboom/Extender: Linton Ham ?Rikki Spearing ?Weeks in Treatment: 6 ?Clinic Level of Care Assessment Items ?TOOL 4 Quantity Score ?X- 1  0 ?Use when only an EandM is performed on FOLLOW-UP visit ?ASSESSMENTS - Nursing Assessment / Reassessment ?X- 1 10 ?Reassessment of Co-morbidities (includes updates in patient status) ?X- 1 5 ?Reassessment of Adherence to Treatment Plan ?ASSESSMENTS - Wound and Skin A ssessment / Reassessment ?X - Simple Wound Assessment / Reassessment - one wound 1 5 ?[]  - 0 ?Complex Wound Assessment / Reassessment - multiple wounds ?[]  - 0 ?Dermatologic / Skin Assessment (not related to wound area) ?ASSESSMENTS - Focused Assessment ?[]  - 0 ?Circumferential Edema Measurements - multi extremities ?[]  - 0 ?Nutritional Assessment / Counseling / Intervention ?[]  - 0 ?Lower Extremity Assessment (monofilament, tuning fork, pulses) ?[]  - 0 ?Peripheral Arterial Disease Assessment (using hand held doppler) ?ASSESSMENTS - Ostomy and/or Continence Assessment and Care ?[]  - 0 ?Incontinence Assessment and Management ?[]  - 0 ?Ostomy Care Assessment and Management (repouching, etc.) ?PROCESS - Coordination of Care ?X - Simple Patient / Family Education for ongoing care 1 15 ?[]  - 0 ?Complex (extensive) Patient / Family Education for ongoing care ?[]  - 0 ?Staff obtains Consents, Records, T Results / Process Orders ?est ?[]  - 0 ?Staff telephones HHA, Nursing Homes / Clarify orders / etc ?[]  - 0 ?Routine Transfer to another Facility (non-emergent condition) ?[]  - 0 ?Routine Hospital Admission (non-emergent condition) ?[]  - 0 ?New Admissions / Biomedical engineer / Ordering NPWT Apligraf, etc. ?, ?[]  - 0 ?Emergency Hospital Admission (emergent condition) ?X- 1 10 ?Simple Discharge Coordination ?[]  - 0 ?Complex (extensive) Discharge Coordination ?PROCESS - Special Needs ?[]  - 0 ?Pediatric / Minor Patient Management ?[]  - 0 ?Isolation Patient Management ?[]  - 0 ?Hearing / Language / Visual special needs ?[]  - 0 ?Assessment of Community assistance (transportation, D/C planning, etc.) ?[]  - 0 ?Additional assistance / Altered mentation ?[]  -  0 ?Support Surface(s) Assessment (bed, cushion, seat, etc.) ?INTERVENTIONS - Wound Cleansing / Measurement ?[]  - 0 ?Simple Wound Cleansing - one wound ?[]  -  0 ?Complex Wound Cleansing - multiple wounds ?X- 1 5 ?Wound Imaging (photographs - any number of wounds) ?[]  - 0 ?Wound Tracing (instead of photographs) ?[]  - 0 ?Simple Wound Measurement - one wound ?[]  - 0 ?Complex Wound Measurement - multiple wounds ?INTERVENTIONS - Wound Dressings ?X - Small Wound Dressing one or multiple wounds 1 10 ?[]  - 0 ?Medium Wound Dressing one or multiple wounds ?[]  - 0 ?Large Wound Dressing one or multiple wounds ?[]  - 0 ?Application of Medications - topical ?[]  - 0 ?Application of Medications - injection ?INTERVENTIONS - Miscellaneous ?[]  - 0 ?External ear exam ?[]  - 0 ?Specimen Collection (cultures, biopsies, blood, body fluids, etc.) ?[]  - 0 ?Specimen(s) / Culture(s) sent or taken to Lab for analysis ?[]  - 0 ?Patient Transfer (multiple staff / Civil Service fast streamer / Similar devices) ?[]  - 0 ?Simple Staple / Suture removal (25 or less) ?[]  - 0 ?Complex Staple / Suture removal (26 or more) ?[]  - 0 ?Hypo / Hyperglycemic Management (close monitor of Blood Glucose) ?[]  - 0 ?Ankle / Brachial Index (ABI) - do not check if billed separately ?X- 1 5 ?Vital Signs ?Has the patient been seen at the hospital within the last three years: Yes ?Total Score: 65 ?Level Of Care: New/Established - Level 2 ?Electronic Signature(s) ?Signed: 02/06/2022 11:51:28 AM By: Lorrin Jackson ?Entered By: Lorrin Jackson on 02/06/2022 09:26:34 ?-------------------------------------------------------------------------------- ?Encounter Discharge Information Details ?Patient Name: Date of Service: ?[name], [name] 02/06/2022 8:45 A M ?Medical Record Number: [id number] ?Patient Account Number: [id number] ?Date of Birth/Sex: Treating RN: ?1946/01/12 (76 y.o. Sue Hinton ?Primary Care Dejan Angert: Rikki Spearing Other Clinician: ?Referring Shaquoya Cosper: ?Treating Emary Zalar/Extender:  Linton Ham ?Rikki Spearing ?Weeks in Treatment: 6 ?Encounter Discharge Information Items ?Discharge Condition: Stable ?Ambulatory Status: Ambulatory ?Discharge Destination: Home ?Transportation: Private Auto ?Schedule Follow-up Appointment: No ?Clinical Summary of Care: Provided on 02/06/2022 ?Form Type Recipient ?Paper Patient Patient ?Electronic Signature(s) ?Signed: 02/06/2022 11:51:28 AM By: Lorrin Jackson ?Entered By: Lorrin Jackson on 02/06/2022 09:38:21 ?-------------------------------------------------------------------------------- ?Lower Extremity Assessment Details ?Patient Name: Date of Service: ?[name], [name] 02/06/2022 8:45 A M ?Medical Record Number: [id number] ?Patient Account Number: [id number] ?Date of Birth/Sex: Treating RN: ?[birth year] (76 y.o. Sue Hinton ?Primary Care Jencarlos Nicolson: Rikki Spearing Other Clinician: ?Referring Ettel Albergo: ?Treating Sutter Ahlgren/Extender: Linton Ham ?Rikki Spearing ?Weeks in Treatment: 6 ?Edema Assessment ?Assessed: [Left: Yes] [Right: No] ?Edema: [Left: Ye] [Right: s] ?Calf ?Left: Right: ?Point of Measurement: 29 cm From Medial Instep 50 cm ?Ankle ?Left: Right: ?Point of Measurement: 7 cm From Medial Instep 31.5 cm ?Vascular Assessment ?Pulses: ?Dorsalis Pedis ?Palpable: [Left:Yes] ?Electronic Signature(s) ?Signed: 02/06/2022 11:51:28 AM By: Lorrin Jackson ?Entered By: Lorrin Jackson on 02/06/2022 09:12:36 ?-------------------------------------------------------------------------------- ?Multi Wound Chart Details ?Patient Name: ?Date of Service: ?[name], [name] 02/06/2022 8:45 A M ?Medical Record Number: [id number] ?Patient Account Number: [id number] ?Date of Birth/Sex: ?Treating RN: ?09-Dec-1945 (76 y.o. Sue Hinton ?Primary Care Jani Ploeger: Rikki Spearing ?Other Clinician: ?Referring Cornelis Kluver: ?Treating Pace Lamadrid/Extender: Linton Ham ?Rikki Spearing ?Weeks in Treatment: 6 ?Vital Signs ?Height(in): 62 ?Pulse(bpm): 50 ?Weight(lbs): 214 ?Blood Pressure(mmHg): 128/64 ?Body Mass  Index(BMI): 39.1 ?Temperature(??F): 97.9 ?Respiratory Rate(breaths/min): 18 ?Photos: [N/A:N/A] ?Left, Anterior Lower Leg N/A N/A ?Wound Location: ?Hematoma N/A N/A ?Wounding Event: ?Abrasion N/A N/A ?Primary E

## 2022-02-26 DIAGNOSIS — M810 Age-related osteoporosis without current pathological fracture: Secondary | ICD-10-CM | POA: Diagnosis not present

## 2022-02-26 DIAGNOSIS — I10 Essential (primary) hypertension: Secondary | ICD-10-CM | POA: Diagnosis not present

## 2022-02-26 DIAGNOSIS — Z0000 Encounter for general adult medical examination without abnormal findings: Secondary | ICD-10-CM | POA: Diagnosis not present

## 2022-02-26 DIAGNOSIS — F418 Other specified anxiety disorders: Secondary | ICD-10-CM | POA: Diagnosis not present

## 2022-02-26 DIAGNOSIS — Z1239 Encounter for other screening for malignant neoplasm of breast: Secondary | ICD-10-CM | POA: Diagnosis not present

## 2022-02-26 DIAGNOSIS — I4510 Unspecified right bundle-branch block: Secondary | ICD-10-CM | POA: Diagnosis not present

## 2022-02-26 DIAGNOSIS — D509 Iron deficiency anemia, unspecified: Secondary | ICD-10-CM | POA: Diagnosis not present

## 2022-02-26 DIAGNOSIS — E1169 Type 2 diabetes mellitus with other specified complication: Secondary | ICD-10-CM | POA: Diagnosis not present

## 2022-03-30 ENCOUNTER — Encounter (HOSPITAL_BASED_OUTPATIENT_CLINIC_OR_DEPARTMENT_OTHER): Payer: Medicare HMO | Attending: Internal Medicine | Admitting: Internal Medicine

## 2022-03-30 DIAGNOSIS — E11628 Type 2 diabetes mellitus with other skin complications: Secondary | ICD-10-CM | POA: Insufficient documentation

## 2022-03-30 DIAGNOSIS — I890 Lymphedema, not elsewhere classified: Secondary | ICD-10-CM | POA: Diagnosis not present

## 2022-03-30 DIAGNOSIS — I872 Venous insufficiency (chronic) (peripheral): Secondary | ICD-10-CM | POA: Insufficient documentation

## 2022-03-30 DIAGNOSIS — I10 Essential (primary) hypertension: Secondary | ICD-10-CM | POA: Insufficient documentation

## 2022-03-30 DIAGNOSIS — E039 Hypothyroidism, unspecified: Secondary | ICD-10-CM | POA: Diagnosis not present

## 2022-03-30 DIAGNOSIS — Z7984 Long term (current) use of oral hypoglycemic drugs: Secondary | ICD-10-CM | POA: Insufficient documentation

## 2022-03-30 NOTE — Progress Notes (Signed)
Sitzer, AKEILA YAROSH ([id number]) Visit Report for 03/30/2022 Chief Complaint Document Details Patient Name: Date of Service: [name], [name] 03/30/2022 8:45 A M Medical Record Number: [id number] Patient Account Number: [id number] Date of Birth/Sex: Treating RN: 1946/08/16 (76 y.o. [name] Primary Care Provider: Rikki Spearing Other Clinician: Referring Provider: Treating Provider/Extender: Wallace Keller Weeks in Treatment: 0 Information Obtained from: Patient Chief Complaint Left lower extremity wound Electronic Signature(s) Signed: 03/30/2022 10:15:35 AM By: Kalman Shan DO Entered By: Kalman Shan on 03/30/2022 10:07:48 -------------------------------------------------------------------------------- HPI Details Patient Name: Date of Service: [name], [name] 03/30/2022 8:45 A M Medical Record Number: [id number] Patient Account Number: [id number] Date of Birth/Sex: Treating RN: 1945/10/22 (76 y.o. [name] Primary Care Provider: Rikki Spearing Other Clinician: Referring Provider: Treating Provider/Extender: Wallace Keller Weeks in Treatment: 0 History of Present Illness HPI Description: Admission in 12/25/2021 Ms. Madisin Sherfey is a 76 year old female with a past medical history of essential hypertension, hypothyroidism and type 2 diabetes on oral agents with recent hemoglobin A1c of 7.4 that presents to the clinic for a 70-month history of open wound to her left lower extremity. She states that on January 1 she was putting a [place] in the freezer and it fell out and hit her leg. She developed a hematoma and this eventually ruptured. She visited her primary care physician for this issue and was prescribed Bactrim for cellulitis and mupirocin cream to use daily to the wound bed. She has pictures and showed them to me today. Over the past 2 months the wound has improved in size and appearance. She currently denies systemic signs of infection.  She does not have/wear compression stockings. 3/24; patient presents for follow-up. She has no issues or complaints today. She tolerated the compression wrap well. She reports improvement in her chronic pain to the wound site. She denies signs of infection. 3/31; patient presents for follow-up. She has tolerated the compression wrap well. She states that her ABIs have been scheduled for the end of next month. She has no issues or complaints today. She denies pain to the wound site. 4/7; patient presents for follow-up. She reports that the compression wrap rolled down slightly causing a bruise. She has no issues to the wound bed. She denies signs of infection. 4/14; 1 week follow-up. Compression wrap stayed on all week. She has been using gent and Hydrofera Blue to the wound area which only has 1 small open area remaining better than last week. She gives an interesting history of longstanding edema in her legs in fact she gives a very impressive family history of this. Arterial studies next week. She has received her bilateral juxtallites Really4/28; the patient's wound is fully epithelialized. She has decent edemacontrol and kerlix Coban. She went on to have her arterial studies done show any problem on the right. Her ABI was not calculated however her TBI in the right was 0.95 triphasic waveforms. On the left the patient was not able to tolerate leg pressures. She had biphasic waveforms so they went on to do Dopplers of her entire legs which did not show any significant stenosis or plaque. Her TBI in the left was 1.06. Not felt to have any significant arterial issue Readmission 03/30/2022: Patient states that almost 2 weeks ago she developed weeping to her right lower extremity. She states that over the past 1 to 2 weeks the areas have healed and she no longer has weeping. She denies open wounds.  She states she has a juxta light compression to the left lower extremity and a compression stocking to  the right lower extremity. Electronic Signature(s) Signed: 03/30/2022 10:15:35 AM By: Geralyn Corwin DO Entered By: Geralyn Corwin on 03/30/2022 10:09:50 -------------------------------------------------------------------------------- Physical Exam Details Patient Name: Date of Service: [name], [name] 03/30/2022 8:45 A M Medical Record Number: [id number] Patient Account Number: [id number] Date of Birth/Sex: Treating RN: [birth year] (76 y.o. Roel Cluck Primary Care Provider: Hamilton Capri Other Clinician: Referring Provider: Treating Provider/Extender: Arnoldo Hooker Weeks in Treatment: 0 Constitutional respirations regular, non-labored and within target range for patient.. Cardiovascular 2+ dorsalis pedis/posterior tibialis pulses. Psychiatric pleasant and cooperative. Notes No open wounds to the lower extremities bilaterally. 2+ pitting edema to the knees bilaterally. Venous stasis dermatitis Electronic Signature(s) Signed: 03/30/2022 10:15:35 AM By: Geralyn Corwin DO Entered By: Geralyn Corwin on 03/30/2022 10:10:20 -------------------------------------------------------------------------------- Physician Orders Details Patient Name: Date of Service: [name], [name] 03/30/2022 8:45 A M Medical Record Number: [id number] Patient Account Number: [id number] Date of Birth/Sex: Treating RN: 10-30-1945 (76 y.o. Roel Cluck Primary Care Provider: Hamilton Capri Other Clinician: Referring Provider: Treating Provider/Extender: Arnoldo Hooker Weeks in Treatment: 0 Verbal / Phone Orders: No Diagnosis Coding ICD-10 Coding Code Description I87.2 Venous insufficiency (chronic) (peripheral) I89.0 Lymphedema, not elsewhere classified E11.628 Type 2 diabetes mellitus with other skin complications E03.9 Hypothyroidism, unspecified [facility]charge From [facility] - No follow up at this time. Edema Control - Lymphedema /  SCD / Other Elevate legs to the level of the heart or above for 30 minutes daily and/or when sitting, a frequency of: - throughout the day Avoid standing for long periods of time. Compression stocking or Garment 30-40 mm/Hg pressure to: - Juxtalite compression to bilateral legs Other Edema Control Orders/Instructions: - Use Lasix as needed. Non Wound Condition Other Non Wound Condition Orders/Instructions: - We will order Juxtalite Compression wrap for right leg. Electronic Signature(s) Signed: 03/30/2022 10:15:35 AM By: Geralyn Corwin DO Entered By: Geralyn Corwin on 03/30/2022 10:10:31 -------------------------------------------------------------------------------- Problem List Details Patient Name: Date of Service: [name], [name] 03/30/2022 8:45 A M Medical Record Number: [id number] Patient Account Number: [id number] Date of Birth/Sex: Treating RN: 24-Mar-1946 (76 y.o. Roel Cluck Primary Care Provider: Hamilton Capri Other Clinician: Referring Provider: Treating Provider/Extender: Arnoldo Hooker Weeks in Treatment: 0 Active Problems ICD-10 Encounter Code Description Active Date MDM Diagnosis I87.2 Venous insufficiency (chronic) (peripheral) 03/30/2022 No Yes I89.0 Lymphedema, not elsewhere classified 03/30/2022 No Yes E11.628 Type 2 diabetes mellitus with other skin complications 03/30/2022 No Yes E03.9 Hypothyroidism, unspecified 03/30/2022 No Yes Inactive Problems Resolved Problems Electronic Signature(s) Signed: 03/30/2022 10:15:35 AM By: Geralyn Corwin DO Entered By: Geralyn Corwin on 03/30/2022 10:07:11 -------------------------------------------------------------------------------- Progress Note Details Patient Name: Date of Service: [name], [name] 03/30/2022 8:45 A M Medical Record Number: [id number] Patient Account Number: [id number] Date of Birth/Sex: Treating RN: July 19, 1946 (75 y.o. Roel Cluck Primary Care Provider: Hamilton Capri Other Clinician: Referring Provider: Treating Provider/Extender: Arnoldo Hooker Weeks in Treatment: 0 Subjective Chief Complaint Information obtained from Patient Left lower extremity wound History of Present Illness (HPI) Admission in 12/25/2021 Ms. Hayat Warbington is a 76 year old female with a past medical history of essential hypertension, hypothyroidism and type 2 diabetes on oral agents with recent hemoglobin A1c of 7.4 that presents to the clinic for a 38-month history of open wound to her left  lower extremity. She states that on January 1 she was putting a [place] in the freezer and it fell out and hit her leg. She developed a hematoma and this eventually ruptured. She visited her primary care physician for this issue and was prescribed Bactrim for cellulitis and mupirocin cream to use daily to the wound bed. She has pictures and showed them to me today. Over the past 2 months the wound has improved in size and appearance. She currently denies systemic signs of infection. She does not have/wear compression stockings. 3/24; patient presents for follow-up. She has no issues or complaints today. She tolerated the compression wrap well. She reports improvement in her chronic pain to the wound site. She denies signs of infection. 3/31; patient presents for follow-up. She has tolerated the compression wrap well. She states that her ABIs have been scheduled for the end of next month. She has no issues or complaints today. She denies pain to the wound site. 4/7; patient presents for follow-up. She reports that the compression wrap rolled down slightly causing a bruise. She has no issues to the wound bed. She denies signs of infection. 4/14; 1 week follow-up. Compression wrap stayed on all week. She has been using gent and Hydrofera Blue to the wound area which only has 1 small open area remaining better than last week. She gives an interesting history of longstanding edema in her  legs in fact she gives a very impressive family history of this. Arterial studies next week. She has received her bilateral juxtallites Really4/28; the patient's wound is fully epithelialized. She has decent edemacontrol and kerlix Coban. She went on to have her arterial studies done show any problem on the right. Her ABI was not calculated however her TBI in the right was 0.95 triphasic waveforms. On the left the patient was not able to tolerate leg pressures. She had biphasic waveforms so they went on to do Dopplers of her entire legs which did not show any significant stenosis or plaque. Her TBI in the left was 1.06. Not felt to have any significant arterial issue Readmission 03/30/2022: Patient states that almost 2 weeks ago she developed weeping to her right lower extremity. She states that over the past 1 to 2 weeks the areas have healed and she no longer has weeping. She denies open wounds. She states she has a juxta light compression to the left lower extremity and a compression stocking to the right lower extremity. Patient History Information obtained from Patient, Chart. Allergies codeine, Glucophage, Tylenol Family History Heart Disease - Father, Hypertension - Father, Stroke - Father, No family history of Cancer, Diabetes, Hereditary Spherocytosis, Kidney Disease, Lung Disease, Seizures, Thyroid Problems, Tuberculosis. Social History Never smoker, Alcohol Use - Never, Drug Use - No History, Caffeine Use - Daily - coffee. Medical History Eyes Denies history of Cataracts, Glaucoma, Optic Neuritis Ear/Nose/Mouth/Throat Denies history of Chronic sinus problems/congestion, Middle ear problems Hematologic/Lymphatic Patient has history of Anemia - iron deficient Denies history of Hemophilia, Human Immunodeficiency Virus, Lymphedema, Sickle Cell Disease Respiratory Denies history of Aspiration, Asthma, Chronic Obstructive Pulmonary Disease (COPD), Pneumothorax, Sleep Apnea,  Tuberculosis Cardiovascular Patient has history of Arrhythmia - RBBB, Hypertension Denies history of Angina, Congestive Heart Failure, Coronary Artery Disease, Deep Vein Thrombosis, Hypotension, Myocardial Infarction, Peripheral Arterial Disease, Peripheral Venous Disease, Phlebitis, Vasculitis Gastrointestinal Denies history of Cirrhosis , Colitis, Crohnoos, Hepatitis A, Hepatitis B, Hepatitis C Endocrine Patient has history of Type II Diabetes Denies history of Type I Diabetes Genitourinary Denies history of  End Stage Renal Disease Immunological Denies history of Lupus Erythematosus, Raynaudoos, Scleroderma Integumentary (Skin) Denies history of History of Burn Musculoskeletal Patient has history of Osteoarthritis Denies history of Gout, Rheumatoid Arthritis, Osteomyelitis Neurologic Denies history of Dementia, Neuropathy, Quadriplegia, Paraplegia, Seizure Disorder Oncologic Denies history of Received Chemotherapy, Received Radiation Medical A Surgical History Notes nd Constitutional Symptoms (General Health) Leg Edema Hypothyroidism vitamin D deficient low calcium Hematologic/Lymphatic Hyperlipidemia Psychiatric Depression and anxiety Objective Constitutional respirations regular, non-labored and within target range for patient.. Vitals Time Taken: 9:05 AM, Temperature: 99.0 F, Pulse: 99 bpm, Respiratory Rate: 18 breaths/min, Blood Pressure: 151/99 mmHg. Cardiovascular 2+ dorsalis pedis/posterior tibialis pulses. Psychiatric pleasant and cooperative. General Notes: No open wounds to the lower extremities bilaterally. 2+ pitting edema to the knees bilaterally. Venous stasis dermatitis Assessment Active Problems ICD-10 Venous insufficiency (chronic) (peripheral) Lymphedema, not elsewhere classified Type 2 diabetes mellitus with other skin complications Hypothyroidism, unspecified Patient has a history of open wounds to her left lower extremity treated in our clinic  previously. She has been using a juxta lite compression to this leg with benefit. Unfortunately she developed weeping to the right lower extremity about 1 to 2 weeks ago. Currently no weeping or open wounds. At this time I recommended using the juxta lite compression to this lower extremity. We took measurements today and ordered this for the patient. I also recommended she elevate her legs when sitting. She has a diuretic that she uses as needed for swelling. She knows to call with any questions or concerns. Plan Discharge From [facility]: Discharge from [place] - No follow up at this time. Edema Control - Lymphedema / SCD / Other: Elevate legs to the level of the heart or above for 30 minutes daily and/or when sitting, a frequency of: - throughout the day Avoid standing for long periods of time. Compression stocking or Garment 30-40 mm/Hg pressure to: - Juxtalite compression to bilateral legs Other Edema Control Orders/Instructions: - Use Lasix as needed. Non Wound Condition: Other Non Wound Condition Orders/Instructions: - We will order Juxtalite Compression wrap for right leg. 1. Order juxta light compression to the right lower extremity 2. Follow-up as needed Electronic Signature(s) Signed: 03/30/2022 10:15:35 AM By: Kalman Shan DO Entered By: Kalman Shan on 03/30/2022 10:14:39 -------------------------------------------------------------------------------- HxROS Details Patient Name: Date of Service: [name], [name] 03/30/2022 8:45 A M Medical Record Number: [id number] Patient Account Number: [id number] Date of Birth/Sex: Treating RN: 1945-10-17 (76 y.o. [name] Primary Care Provider: Rikki Spearing Other Clinician: Referring Provider: Treating Provider/Extender: Wallace Keller Weeks in Treatment: 0 Information Obtained From Patient Chart Constitutional Symptoms (General Health) Medical History: Past Medical History Notes: Leg  Edema Hypothyroidism vitamin D deficient low calcium Eyes Medical History: Negative for: Cataracts; Glaucoma; Optic Neuritis Ear/Nose/Mouth/Throat Medical History: Negative for: Chronic sinus problems/congestion; Middle ear problems Hematologic/Lymphatic Medical History: Positive for: Anemia - iron deficient Negative for: Hemophilia; Human Immunodeficiency Virus; Lymphedema; Sickle Cell Disease Past Medical History Notes: Hyperlipidemia Respiratory Medical History: Negative for: Aspiration; Asthma; Chronic Obstructive Pulmonary Disease (COPD); Pneumothorax; Sleep Apnea; Tuberculosis Cardiovascular Medical History: Positive for: Arrhythmia - RBBB; Hypertension Negative for: Angina; Congestive Heart Failure; Coronary Artery Disease; Deep Vein Thrombosis; Hypotension; Myocardial Infarction; Peripheral Arterial Disease; Peripheral Venous Disease; Phlebitis; Vasculitis Gastrointestinal Medical History: Negative for: Cirrhosis ; Colitis; Crohns; Hepatitis A; Hepatitis B; Hepatitis C Endocrine Medical History: Positive for: Type II Diabetes Negative for: Type I Diabetes Time with diabetes: 50 years Treated with: Oral agents Blood  sugar tested every day: Yes Tested : every other day Genitourinary Medical History: Negative for: End Stage Renal Disease Immunological Medical History: Negative for: Lupus Erythematosus; Raynauds; Scleroderma Integumentary (Skin) Medical History: Negative for: History of Burn Musculoskeletal Medical History: Positive for: Osteoarthritis Negative for: Gout; Rheumatoid Arthritis; Osteomyelitis Neurologic Medical History: Negative for: Dementia; Neuropathy; Quadriplegia; Paraplegia; Seizure Disorder Oncologic Medical History: Negative for: Received Chemotherapy; Received Radiation Psychiatric Medical History: Past Medical History Notes: Depression and anxiety Immunizations Pneumococcal Vaccine: Received Pneumococcal Vaccination:  Yes Received Pneumococcal Vaccination On or After 60th Birthday: No Implantable Devices No devices added Family and Social History Cancer: No; Diabetes: No; Heart Disease: Yes - Father; Hereditary Spherocytosis: No; Hypertension: Yes - Father; Kidney Disease: No; Lung Disease: No; Seizures: No; Stroke: Yes - Father; Thyroid Problems: No; Tuberculosis: No; Never smoker; Alcohol Use: Never; Drug Use: No History; Caffeine Use: Daily - coffee; Financial Concerns: No; Food, Clothing or Shelter Needs: No; Support System Lacking: No; Transportation Concerns: No Electronic Signature(s) Signed: 03/30/2022 10:15:35 AM By: Geralyn Corwin DO Signed: 03/30/2022 5:16:31 PM By: Antonieta Iba Entered By: Antonieta Iba on 03/30/2022 07:44:52 -------------------------------------------------------------------------------- SuperBill Details Patient Name: Date of Service: [name], [name] 03/30/2022 Medical Record Number: [id number] Patient Account Number: [id number] Date of Birth/Sex: Treating RN: 1946-08-06 (76 y.o. Roel Cluck Primary Care Provider: Hamilton Capri Other Clinician: Referring Provider: Treating Provider/Extender: Arnoldo Hooker Weeks in Treatment: 0 Diagnosis Coding ICD-10 Codes Code Description E11.628 Type 2 diabetes mellitus with other skin complications E03.9 Hypothyroidism, unspecified I87.2 Venous insufficiency (chronic) (peripheral) I89.0 Lymphedema, not elsewhere classified Facility Procedures CPT4 Code: [id number] Description: [phone] - WOUND CARE VISIT-LEV 2 EST PT Modifier: 25 Quantity: 1 Physician Procedures : CPT4 Code Description Modifier [id number] - WC PHYS LEVEL 3 - EST PT ICD-10 Diagnosis Description E11.628 Type 2 diabetes mellitus with other skin complications E03.9 Hypothyroidism, unspecified I87.2 Venous insufficiency (chronic) (peripheral)  I89.0 Lymphedema, not elsewhere classified Quantity: 1 Electronic Signature(s) Signed: 03/30/2022  10:15:35 AM By: Geralyn Corwin DO Entered By: Geralyn Corwin on 03/30/2022 10:14:58

## 2022-03-30 NOTE — Progress Notes (Addendum)
Longoria, SORIAH LEEMAN ([id number]) Visit Report for 03/30/2022 Allergy List Details Patient Name: Date of Service: [name], [name] 03/30/2022 8:45 A M Medical Record Number: [id number] Patient Account Number: [id number] Date of Birth/Sex: Treating RN: 04/13/1946 (76 y.o. Roel Cluck Primary Care Shalon Salado: Hamilton Capri Other Clinician: Referring Dovber Ernest: Treating Maximillion Gill/Extender: Arnoldo Hooker Weeks in Treatment: 0 Allergies Active Allergies codeine Glucophage Tylenol Allergy Notes Electronic Signature(s) Signed: 03/30/2022 5:16:31 PM By: Antonieta Iba Previous Signature: 03/30/2022 7:44:33 AM Version By: Antonieta Iba Entered By: Antonieta Iba on 03/30/2022 09:08:03 -------------------------------------------------------------------------------- Arrival Information Details Patient Name: Date of Service: [name], [name] 03/30/2022 8:45 A M Medical Record Number: [id number] Patient Account Number: [id number] Date of Birth/Sex: Treating RN: [birth year] (76 y.o. Roel Cluck Primary Care Walker Paddack: Hamilton Capri Other Clinician: Referring Madelena Maturin: Treating Nereyda Bowler/Extender: Arnoldo Hooker Weeks in Treatment: 0 Visit Information Patient Arrived: Ambulatory Arrival Time: 09:04 Transfer Assistance: None Patient Identification Verified: Yes Secondary Verification Process Completed: Yes Patient Requires Transmission-Based Precautions: No Patient Has Alerts: Yes Patient Alerts: Patient on Blood Thinner TBI R=0.95 L=1.06 History Since Last Visit Added or deleted any medications: No Any new allergies or adverse reactions: No Had a fall or experienced change in activities of daily living that may affect risk of falls: No Signs or symptoms of abuse/neglect since last visito No Hospitalized since last visit: No Implantable device outside of the clinic excluding cellular tissue based products placed in the center since last visit: No Has Dressing in  Place as Prescribed: Yes Electronic Signature(s) Signed: 03/30/2022 5:16:31 PM By: Antonieta Iba Entered By: Antonieta Iba on 03/30/2022 09:20:28 -------------------------------------------------------------------------------- Clinic Level of Care Assessment Details Patient Name: Date of Service: [name], [name] 03/30/2022 8:45 A M Medical Record Number: [id number] Patient Account Number: [id number] Date of Birth/Sex: Treating RN: 04-07-1946 (76 y.o. Roel Cluck Primary Care Jadian Karman: Hamilton Capri Other Clinician: Referring Caulin Begley: Treating Joseeduardo Brix/Extender: Arnoldo Hooker Weeks in Treatment: 0 Clinic Level of Care Assessment Items TOOL 2 Quantity Score X- 1 0 Use when only an EandM is performed on the INITIAL visit ASSESSMENTS - Nursing Assessment / Reassessment X- 1 20 General Physical Exam (combine w/ comprehensive assessment (listed just below) when performed on new pt. evals) X- 1 25 Comprehensive Assessment (HX, ROS, Risk Assessments, Wounds Hx, etc.) ASSESSMENTS - Wound and Skin A ssessment / Reassessment []  - 0 Simple Wound Assessment / Reassessment - one wound []  - 0 Complex Wound Assessment / Reassessment - multiple wounds []  - 0 Dermatologic / Skin Assessment (not related to wound area) ASSESSMENTS - Ostomy and/or Continence Assessment and Care []  - 0 Incontinence Assessment and Management []  - 0 Ostomy Care Assessment and Management (repouching, etc.) PROCESS - Coordination of Care []  - 0 Simple Patient / Family Education for ongoing care X- 1 20 Complex (extensive) Patient / Family Education for ongoing care X- 1 10 Staff obtains , Records, T Results / Process Orders est []  - 0 Staff telephones HHA, Nursing Homes / Clarify orders / etc []  - 0 Routine Transfer to another Facility (non-emergent condition) []  - 0 Routine Hospital Admission (non-emergent condition) []  - 0 New Admissions / / Ordering NPWT  Apligraf, etc. , []  - 0 Emergency Hospital Admission (emergent condition) []  - 0 Simple Discharge Coordination []  - 0 Complex (extensive) Discharge Coordination PROCESS - Special Needs []  - 0 Pediatric / Minor Patient Management []  - 0 Isolation Patient Management []  -  0 Hearing / Language / Visual special needs []  - 0 Assessment of Community assistance (transportation, D/C planning, etc.) []  - 0 Additional assistance / Altered mentation []  - 0 Support Surface(s) Assessment (bed, cushion, seat, etc.) INTERVENTIONS - Wound Cleansing / Measurement []  - 0 Wound Imaging (photographs - any number of wounds) []  - 0 Wound Tracing (instead of photographs) []  - 0 Simple Wound Measurement - one wound []  - 0 Complex Wound Measurement - multiple wounds []  - 0 Simple Wound Cleansing - one wound []  - 0 Complex Wound Cleansing - multiple wounds INTERVENTIONS - Wound Dressings []  - 0 Small Wound Dressing one or multiple wounds []  - 0 Medium Wound Dressing one or multiple wounds []  - 0 Large Wound Dressing one or multiple wounds []  - 0 Application of Medications - injection INTERVENTIONS - Miscellaneous []  - 0 External ear exam []  - 0 Specimen Collection (cultures, biopsies, blood, body fluids, etc.) []  - 0 Specimen(s) / Culture(s) sent or taken to Lab for analysis []  - 0 Patient Transfer (multiple staff / Civil Service fast streamer / Similar devices) []  - 0 Simple Staple / Suture removal (25 or less) []  - 0 Complex Staple / Suture removal (26 or more) []  - 0 Hypo / Hyperglycemic Management (close monitor of Blood Glucose) []  - 0 Ankle / Brachial Index (ABI) - do not check if billed separately Has the patient been seen at the hospital within the last three years: Yes Total Score: 75 Level Of Care: New/Established - Level 2 Electronic Signature(s) Signed: 03/30/2022 5:16:31 PM By: Lorrin Jackson Entered By: Lorrin Jackson on 03/30/2022  09:48:42 -------------------------------------------------------------------------------- Encounter Discharge Information Details Patient Name: Date of Service: [name], [name] 03/30/2022 8:45 A M Medical Record Number: [id number] Patient Account Number: [id number] Date of Birth/Sex: Treating RN: Jul 09, 1946 (76 y.o. [name] Primary Care Khamari Sheehan: Rikki Spearing Other Clinician: Referring Jawan Chavarria: Treating Akeila Lana/Extender: Wallace Keller Weeks in Treatment: 0 Encounter Discharge Information Items Discharge Condition: Stable Ambulatory Status: Ambulatory Discharge Destination: Home Transportation: Private Auto Schedule Follow-up Appointment: No Clinical Summary of Care: Provided on 03/30/2022 Form Type Recipient Paper Patient Patient Electronic Signature(s) Signed: 03/30/2022 5:16:31 PM By: Lorrin Jackson Entered By: Lorrin Jackson on 03/30/2022 09:50:41 -------------------------------------------------------------------------------- Lower Extremity Assessment Details Patient Name: Date of Service: [name], [name] 03/30/2022 8:45 A M Medical Record Number: [id number] Patient Account Number: [id number] Date of Birth/Sex: Treating RN: Jun 12, 1946 (76 y.o. [name] Primary Care Zylan Almquist: Rikki Spearing Other Clinician: Referring Caelen Reierson: Treating Jamol Ginyard/Extender: Wallace Keller Weeks in Treatment: 0 Edema Assessment Assessed: [Left: Yes] [Right: Yes] Edema: [Left: Yes] [Right: Yes] Calf Left: Right: Point of Measurement: 31 cm From Medial Instep 50 cm 46.5 cm Ankle Left: Right: Point of Measurement: 9 cm From Medial Instep 32.5 cm 32.2 cm Knee To Floor Left: Right: From Medial Instep 37 cm 37 cm Vascular Assessment Pulses: Dorsalis Pedis Palpable: [Left:Yes] [Right:Yes] Electronic Signature(s) Signed: 03/30/2022 5:16:31 PM By: Lorrin Jackson Entered By: Lorrin Jackson on 03/30/2022  09:15:46 -------------------------------------------------------------------------------- Multi Wound Chart Details Patient Name: Date of Service: [name], [name] 03/30/2022 8:45 A M Medical Record Number: [id number] Patient Account Number: [id number] Date of Birth/Sex: Treating RN: Apr 02, 1946 (76 y.o. [name] Primary Care Lancelot Alyea: Rikki Spearing Other Clinician: Referring Kalene Cutler: Treating Rider Ermis/Extender: Wallace Keller Weeks in Treatment: 0 Vital Signs Height(in): Pulse(bpm): 99 Weight(lbs): Blood Pressure(mmHg): 151/99 Body Mass Index(BMI): Temperature(F): 99.0 Respiratory Rate(breaths/min): 18 Wound Assessments Treatment Notes Electronic  Signature(s) Signed: 03/30/2022 10:15:35 AM By: Geralyn Corwin DO Signed: 03/30/2022 5:16:31 PM By: Antonieta Iba Entered By: Geralyn Corwin on 03/30/2022 10:07:15 -------------------------------------------------------------------------------- Patient/Caregiver Education Details Patient Name: Date of Service: [name], [name] 6/19/2023andnbsp8:45 A M Medical Record Number: [id number] Patient Account Number: [id number] Date of Birth/Gender: Treating RN: [birth year] (76 y.o. Roel Cluck Primary Care Physician: Hamilton Capri Other Clinician: Referring Physician: Treating Physician/Extender: Dia Crawford in Treatment: 0 Education Assessment Education Provided To: Patient Education Topics Provided Venous: Methods: Explain/Verbal, Printed Responses: State content correctly Electronic Signature(s) Signed: 03/30/2022 5:16:31 PM By: Antonieta Iba Entered By: Antonieta Iba on 03/30/2022 09:47:34 -------------------------------------------------------------------------------- Vitals Details Patient Name: Date of Service: [name], [name] 03/30/2022 8:45 A M Medical Record Number: [id number] Patient Account Number: [id number] Date of Birth/Sex: Treating RN: 19-Aug-1946 (76  y.o. Roel Cluck Primary Care Carlie Solorzano: Hamilton Capri Other Clinician: Referring Isayah Ignasiak: Treating Jiayi Lengacher/Extender: Arnoldo Hooker Weeks in Treatment: 0 Vital Signs Time Taken: 09:05 Temperature (F): 99.0 Pulse (bpm): 99 Respiratory Rate (breaths/min): 18 Blood Pressure (mmHg): 151/99 Reference Range: 80 - 120 mg / dl Electronic Signature(s) Signed: 03/30/2022 5:16:31 PM By: Antonieta Iba Entered By: Antonieta Iba on 03/30/2022 09:07:38

## 2022-03-30 NOTE — Progress Notes (Signed)
Nancy Hinton, GIABELLA DUHART ([id number]) Visit Report for 03/30/2022 Abuse Risk Screen Details Patient Name: Date of Service: [name], [name] 03/30/2022 8:45 A M Medical Record Number: [id number] Patient Account Number: [id number] Date of Birth/Sex: Treating RN: August 30, 1946 (76 y.o. Nancy Hinton Primary Care Anthone Prieur: Hamilton Capri Other Clinician: Referring Drakkar Medeiros: Treating Kenesha Moshier/Extender: Arnoldo Hooker Weeks in Treatment: 0 Abuse Risk Screen Items Answer ABUSE RISK SCREEN: Has anyone close to you tried to hurt or harm you recentlyo No Do you feel uncomfortable with anyone in your familyo No Has anyone forced you do things that you didnt want to doo No Electronic Signature(s) Signed: 03/30/2022 5:16:31 PM By: Antonieta Iba Entered By: Antonieta Iba on 03/30/2022 09:08:15 -------------------------------------------------------------------------------- Activities of Daily Living Details Patient Name: Date of Service: [name], [name] 03/30/2022 8:45 A M Medical Record Number: [id number] Patient Account Number: [id number] Date of Birth/Sex: Treating RN: 02-17-1946 (76 y.o. Nancy Hinton Primary Care Ranell Skibinski: Hamilton Capri Other Clinician: Referring Malahki Gasaway: Treating Dustyn Armbrister/Extender: Arnoldo Hooker Weeks in Treatment: 0 Activities of Daily Living Items Answer Activities of Daily Living (Please select one for each item) Drive Automobile Completely Able T Medications ake Completely Able Use T elephone Completely Able Care for Appearance Completely Able Use T oilet Completely Able Bath / Shower Completely Able Dress Self Completely Able Feed Self Completely Able Walk Completely Able Get In / Out Bed Completely Able Housework Completely Able Prepare Meals Completely Able Handle Money Completely Able Shop for Self Completely Able Electronic Signature(s) Signed: 03/30/2022 5:16:31 PM By: Antonieta Iba Entered By: Antonieta Iba on 03/30/2022  09:08:42 -------------------------------------------------------------------------------- Education Screening Details Patient Name: Date of Service: [name], [name] 03/30/2022 8:45 A M Medical Record Number: [id number] Patient Account Number: [id number] Date of Birth/Sex: Treating RN: [birth year] (76 y.o. Nancy Hinton Primary Care Ludie Pavlik: Hamilton Capri Other Clinician: Referring Bearl Talarico: Treating Luevenia Mcavoy/Extender: Dia Crawford in Treatment: 0 Primary Learner Assessed: Patient Learning Preferences/Education Level/Primary Language Learning Preference: Explanation, Demonstration, Printed Material Highest Education Level: College or Above Preferred Language: English Cognitive Barrier Language Barrier: No Translator Needed: No Memory Deficit: No Emotional Barrier: No Cultural/Religious Beliefs Affecting Medical Care: No Physical Barrier Impaired Vision: Yes Glasses Impaired Hearing: No Decreased Hand dexterity: No Knowledge/Comprehension Knowledge Level: High Comprehension Level: High Ability to understand written instructions: High Ability to understand verbal instructions: High Motivation Anxiety Level: Calm Cooperation: Cooperative Education Importance: Acknowledges Need Interest in Health Problems: Asks Questions Perception: Coherent Willingness to Engage in Self-Management High Activities: Readiness to Engage in Self-Management High Activities: Electronic Signature(s) Signed: 03/30/2022 5:16:31 PM By: Antonieta Iba Entered By: Antonieta Iba on 03/30/2022 09:09:31 -------------------------------------------------------------------------------- Fall Risk Assessment Details Patient Name: Date of Service: [name], [name] 03/30/2022 8:45 A M Medical Record Number: [id number] Patient Account Number: [id number] Date of Birth/Sex: Treating RN: 1946/07/15 (76 y.o. Nancy Hinton Primary Care Kekoa Fyock: Hamilton Capri Other  Clinician: Referring Oriana Horiuchi: Treating Tiwanna Tuch/Extender: Arnoldo Hooker Weeks in Treatment: 0 Fall Risk Assessment Items Have you had 2 or more falls in the last 12 monthso 0 No Have you had any fall that resulted in injury in the last 12 monthso 0 No FALLS RISK SCREEN History of falling - immediate or within 3 months 25 Yes Secondary diagnosis (Do you have 2 or more medical diagnoseso) 0 No Ambulatory aid None/bed rest/wheelchair/nurse 0 Yes Crutches/cane/walker 0 No Furniture 0 No Intravenous therapy Access/Saline/Heparin Lock 0 No Gait/Transferring Normal/ bed  rest/ wheelchair 0 Yes Weak (short steps with or without shuffle, stooped but able to lift head while walking, may seek 0 No support from furniture) Impaired (short steps with shuffle, may have difficulty arising from chair, head down, impaired 0 No balance) Mental Status Oriented to own ability 0 Yes Electronic Signature(s) Signed: 03/30/2022 5:16:31 PM By: Antonieta Iba Entered By: Antonieta Iba on 03/30/2022 09:09:48 -------------------------------------------------------------------------------- Foot Assessment Details Patient Name: Date of Service: [name], [name] 03/30/2022 8:45 A M Medical Record Number: [id number] Patient Account Number: [id number] Date of Birth/Sex: Treating RN: [birth year] (76 y.o. Nancy Hinton Primary Care Kenzie Flakes: Hamilton Capri Other Clinician: Referring Clella Mckeel: Treating Dolan Xia/Extender: Arnoldo Hooker Weeks in Treatment: 0 Foot Assessment Items Site Locations + = Sensation present, - = Sensation absent, C = Callus, U = Ulcer R = Redness, W = Warmth, M = Maceration, PU = Pre-ulcerative lesion F = Fissure, S = Swelling, D = Dryness Assessment Right: Left: Other Deformity: No No Prior Foot Ulcer: No No Prior Amputation: No No Charcot Joint: No No Ambulatory Status: Ambulatory Without Help Gait: Steady Electronic Signature(s) Signed: 03/30/2022  5:16:31 PM By: Antonieta Iba Entered By: Antonieta Iba on 03/30/2022 09:13:10 -------------------------------------------------------------------------------- Nutrition Risk Screening Details Patient Name: Date of Service: [name], [name] 03/30/2022 8:45 A M Medical Record Number: [id number] Patient Account Number: [id number] Date of Birth/Sex: Treating RN: Aug 08, 1946 (76 y.o. Nancy Hinton Primary Care Jameison Haji: Hamilton Capri Other Clinician: Referring Keelon Zurn: Treating Ellamarie Naeve/Extender: Arnoldo Hooker Weeks in Treatment: 0 Height (in): Weight (lbs): Body Mass Index (BMI): Nutrition Risk Screening Items Score Screening NUTRITION RISK SCREEN: I have an illness or condition that made me change the kind and/or amount of food I eat 0 No I eat fewer than two meals per day 0 No I eat few fruits and vegetables, or milk products 0 No I have three or more drinks of beer, liquor or wine almost every day 0 No I have tooth or mouth problems that make it hard for me to eat 0 No I don't always have enough money to buy the food I need 0 No I eat alone most of the time 0 No I take three or more different prescribed or over-the-counter drugs a day 1 Yes Without wanting to, I have lost or gained 10 pounds in the last six months 0 No I am not always physically able to shop, cook and/or feed myself 0 No Nutrition Protocols Good Risk Protocol 0 No interventions needed Moderate Risk Protocol High Risk Proctocol Risk Level: Good Risk Score: 1 Electronic Signature(s) Signed: 03/30/2022 5:16:31 PM By: Antonieta Iba Entered By: Antonieta Iba on 03/30/2022 09:10:05

## 2022-06-01 DIAGNOSIS — R600 Localized edema: Secondary | ICD-10-CM | POA: Diagnosis not present

## 2022-06-08 DIAGNOSIS — R600 Localized edema: Secondary | ICD-10-CM | POA: Diagnosis not present

## 2022-06-21 NOTE — Progress Notes (Unsigned)
Cardiology Office Note:    Date:  06/23/2022   ID:  Nancy Hinton, DOB January 18, 1946, MRN [id number]  PCP:  Lenell Antu, DO  Cardiologist:  Lesleigh Noe, MD   Referring MD: Lenell Antu, DO   Chief Complaint  Patient presents with   Loss of Consciousness   Congestive Heart Failure    History of Present Illness:    Nancy Hinton is a 76 y.o. female with a hx of DM II, morbid obesity, primary hypertension, RBBB, anxiety disorder, and recent syncope referred for evaluation.   She had an episode of syncope in February 2022 and had work-up that identified low burden paroxysmal atrial fibrillation.  Coronary CTA was done and demonstrated moderate nonobstructive coronary disease.  She was also felt to have chronic diastolic heart failure.  SGLT2 therapy was started.  More recently she has had increase in lower extremity swelling.  She has baseline lymphedema and more recently has noted more significant swelling in the lower extremities.  She had an episode of vasovagal syncope in 2022.  This has not recurred.  Vasovagal syncope is clinically diagnosed based on nausea, diaphoresis, and prodrome prior to fainting.  Past Medical History:  Diagnosis Date   Arthritis    Diabetes mellitus without complication (HCC)    history of DM prior to gastric bypass, she has not had to be on medicine since 170 lb weightloss.   Hypertension     Past Surgical History:  Procedure Laterality Date   COLON RESECTION     GASTRIC BYPASS     TONSILLECTOMY      Current Medications: Current Meds  Medication Sig   ACCU-CHEK GUIDE test strip    apixaban (ELIQUIS) 2.5 MG TABS tablet Take 5 mg by mouth daily at 6 (six) AM.   buPROPion (WELLBUTRIN XL) 150 MG 24 hr tablet Take 150 mg by mouth daily.   clonazePAM (KLONOPIN) 1 MG tablet TAKE 1 TABLET BY MOUTH EVERY NIGHT AT BEDTIME AS NEEDED FOR ANXIETY   FEROSUL 325 (65 Fe) MG tablet Take 1 tablet by mouth daily at 12 noon.   furosemide  (LASIX) 20 MG tablet Take 1 tablet (20 mg total) by mouth daily.   glipiZIDE (GLUCOTROL XL) 10 MG 24 hr tablet Take 10 mg by mouth daily with breakfast.   JARDIANCE 10 MG TABS tablet Take 10 mg by mouth daily.   levothyroxine (SYNTHROID, LEVOTHROID) 88 MCG tablet TAKE 1 TABLET BY MOUTH EVERY DAY BEFORE BREAKFAST   lisinopril (PRINIVIL,ZESTRIL) 20 MG tablet Take 1 tablet (20 mg total) by mouth daily.   metoprolol succinate (TOPROL XL) 25 MG 24 hr tablet Take 1 tablet (25 mg total) by mouth daily.   Multiple Vitamin (MULITIVITAMIN WITH MINERALS) TABS Take 1 tablet by mouth daily. Reported on 12/20/2015   potassium chloride (KLOR-CON) 10 MEQ tablet Take 1 tablet (10 mEq total) by mouth daily.   simvastatin (ZOCOR) 10 MG tablet Take 10 mg by mouth daily.   tiZANidine (ZANAFLEX) 2 MG tablet Take 1 tablet by mouth daily.   topiramate (TOPAMAX) 50 MG tablet Take 60 mg by mouth daily.   Vitamin D, Ergocalciferol, (DRISDOL) 50000 units CAPS capsule TAKE 1 CAPSULE BY MOUTH EVERY TUESDAY     Allergies:   Codeine   Social History   Socioeconomic History   Marital status: Divorced    Spouse name: Not on file   Number of children: Not on file   Years of education: Not on file  Highest education level: Not on file  Occupational History   Not on file  Tobacco Use   Smoking status: Never   Smokeless tobacco: Never  Substance and Sexual Activity   Alcohol use: No   Drug use: No   Sexual activity: Never  Other Topics Concern   Not on file  Social History Narrative   Not on file   Social Determinants of Health   Financial Resource Strain: Not on file  Food Insecurity: Not on file  Transportation Needs: Not on file  Physical Activity: Not on file  Stress: Not on file  Social Connections: Not on file     Family History: The patient's family history includes Alzheimer's disease in her mother; Breast cancer in her maternal grandmother; Heart attack in her maternal grandmother; Heart disease  in her father.  ROS:   Please see the history of present illness.    She has a daughter of gurney hot.  She is raising 2 grandchildren.  Mr. Alto Denver died in Mar 12, 2023.  She has had no recurrent dizziness or near fainting.  All other systems reviewed and are negative.  EKGs/Labs/Other Studies Reviewed:    The following studies were reviewed today: Continuous monitor 07/29/2021: 07/29/21 1316  Addendum:          Cardiac event monitor   NSR   1 % burden AF (4 hours 38 minutes)   4% PVC burden   20% PAC burden   Non sustained VT   2D Doppler echocardiogram 2022: IMPRESSIONS     1. Left ventricular ejection fraction, by estimation, is 50 to 55%. The  left ventricle has low normal function. The left ventricle has no regional  wall motion abnormalities. Left ventricular diastolic parameters were  normal.   2. Right ventricular systolic function is normal. The right ventricular  size is mildly enlarged. Tricuspid regurgitation signal is inadequate for  assessing PA pressure.   3. Left atrial size was severely dilated.   4. The mitral valve is normal in structure. Mild to moderate mitral valve  regurgitation. No evidence of mitral stenosis.   5. The aortic valve is tricuspid. Aortic valve regurgitation is not  visualized. No aortic stenosis is present.   6. Pulmonic valve regurgitation is mild to moderate.   7. Aortic dilatation noted. There is mild dilatation of the ascending  aorta, measuring 38 mm.   8. The inferior vena cava is dilated in size with >50% respiratory  variability, suggesting right atrial pressure of 8 mmHg.   EKG:  EKG normal sinus rhythm, PACs, right bundle, left axis deviation.  When compared to prior, paroxysmal A-fib is not present.  Recent Labs: 07/25/2021: ALT 18 12/31/2021: BUN 14; Creatinine, Ser 0.93; Potassium 4.5; Sodium 141  Recent Lipid Panel    Component Value Date/Time   CHOL 144 07/25/2021 0846   TRIG 110 07/25/2021 0846   HDL 48 07/25/2021  0846   CHOLHDL 3.0 07/25/2021 0846   CHOLHDL 2.9 09/07/2014 0929   VLDL 14 09/07/2014 0929   LDLCALC 76 07/25/2021 0846    Physical Exam:    VS:  BP 110/60   Pulse 70   Ht 5\' 3"  (1.6 m)   Wt 204 lb 9.6 oz (92.8 kg)   SpO2 100%   BMI 36.24 kg/m     Wt Readings from Last 3 Encounters:  06/23/22 204 lb 9.6 oz (92.8 kg)  12/31/21 205 lb 3.2 oz (93.1 kg)  07/25/21 217 lb 9.6 oz (98.7 kg)  GEN: Overweight. No acute distress HEENT: Normal NECK: No JVD. LYMPHATICS: No lymphadenopathy CARDIAC: No murmur.  Irregular RR no gallop, or edema. VASCULAR:  Normal Pulses. No bruits. RESPIRATORY:  Clear to auscultation without rales, wheezing or rhonchi  ABDOMEN: Soft, non-tender, non-distended, No pulsatile mass, MUSCULOSKELETAL: No deformity  SKIN: Warm and dry NEUROLOGIC:  Alert and oriented x 3 PSYCHIATRIC:  Normal affect   ASSESSMENT:    1. Coronary artery disease involving native coronary artery of native heart without angina pectoris   2. NSVT (nonsustained ventricular tachycardia) (HCC)   3. Ascending aorta dilatation (HCC)   4. Paroxysmal atrial fibrillation (HCC)   5. Controlled type 2 diabetes mellitus with other circulatory complication, without long-term current use of insulin (HCC)   6. Morbid obesity (HCC)   7. Primary hypertension   8. Syncope and collapse    PLAN:    In order of problems listed above:  Secondary prevention discussed Asymptomatic Ascending aorta 38 mm.  Probably needs to be reevaluated at some point down the line. She is taking Eliquis once a day.  She needs to be on Eliquis 5 mg twice daily.  She will check her prescription instructions at home. Continue current therapy including Jardiance. Did not discuss.  We discussed the possibility of sleep apnea.  She states prior studies were negative and she does not believe she snores. Controlled on current therapy. Propensity for vasovagal syncope.   No change in the current regimen.  Continue  Jardiance as therapy for diastolic heart failure.  May consider repeating an echocardiogram at some point in future but I do not think it is needed now.  Her neck veins are flat.  Lungs are clear.  There is no evidence that she is wet.  Clinical follow-up in 1 year or if problems   Medication Adjustments/Labs and Tests Ordered: Current medicines are reviewed at length with the patient today.  Concerns regarding medicines are outlined above.  Orders Placed This Encounter  Procedures   EKG 12-Lead   No orders of the defined types were placed in this encounter.   Patient Instructions  Medication Instructions:  Your physician recommends that you continue on your current medications as directed. Please refer to the Current Medication list given to you today.  *If you need a refill on your cardiac medications before your next appointment, please call your pharmacy*  Lab Work: NONE  Testing/Procedures: NONE  Follow-Up: At [facility], you and your health needs are our priority.  As part of our continuing mission to provide you with exceptional heart care, we have created designated Provider Care Teams.  These Care Teams include your primary Cardiologist (physician) and Advanced Practice Providers (APPs -  Physician Assistants and Nurse Practitioners) who all work together to provide you with the care you need, when you need it.  Your next appointment:   1 year(s)  The format for your next appointment:   In Person  Provider:   Lesleigh Noe, MD    Important Information About Sugar         Signed, Lesleigh Noe, MD  06/23/2022 8:41 AM     [facility]

## 2022-06-23 ENCOUNTER — Ambulatory Visit: Payer: Medicare HMO | Attending: Interventional Cardiology | Admitting: Interventional Cardiology

## 2022-06-23 ENCOUNTER — Encounter: Payer: Self-pay | Admitting: Interventional Cardiology

## 2022-06-23 VITALS — BP 110/60 | HR 70 | Ht 63.0 in | Wt 204.6 lb

## 2022-06-23 DIAGNOSIS — I4729 Other ventricular tachycardia: Secondary | ICD-10-CM | POA: Diagnosis not present

## 2022-06-23 DIAGNOSIS — I480 Paroxysmal atrial fibrillation: Secondary | ICD-10-CM | POA: Diagnosis not present

## 2022-06-23 DIAGNOSIS — R55 Syncope and collapse: Secondary | ICD-10-CM | POA: Diagnosis not present

## 2022-06-23 DIAGNOSIS — I77810 Thoracic aortic ectasia: Secondary | ICD-10-CM | POA: Diagnosis not present

## 2022-06-23 DIAGNOSIS — I10 Essential (primary) hypertension: Secondary | ICD-10-CM | POA: Diagnosis not present

## 2022-06-23 DIAGNOSIS — E1159 Type 2 diabetes mellitus with other circulatory complications: Secondary | ICD-10-CM | POA: Diagnosis not present

## 2022-06-23 DIAGNOSIS — I2510 Atherosclerotic heart disease of native coronary artery without angina pectoris: Secondary | ICD-10-CM | POA: Diagnosis not present

## 2022-06-23 NOTE — Patient Instructions (Signed)
Medication Instructions:  Your physician recommends that you continue on your current medications as directed. Please refer to the Current Medication list given to you today.  *If you need a refill on your cardiac medications before your next appointment, please call your pharmacy*  Lab Work: NONE  Testing/Procedures: NONE  Follow-Up: At [facility], you and your health needs are our priority.  As part of our continuing mission to provide you with exceptional heart care, we have created designated Provider Care Teams.  These Care Teams include your primary Cardiologist (physician) and Advanced Practice Providers (APPs -  Physician Assistants and Nurse Practitioners) who all work together to provide you with the care you need, when you need it.  Your next appointment:   1 year(s)  The format for your next appointment:   In Person  Provider:   Henry W Smith III, MD    Important Information About Sugar       

## 2022-07-23 ENCOUNTER — Other Ambulatory Visit: Payer: Self-pay | Admitting: Interventional Cardiology

## 2022-07-27 DIAGNOSIS — M6281 Muscle weakness (generalized): Secondary | ICD-10-CM | POA: Diagnosis not present

## 2022-07-27 DIAGNOSIS — I4719 Other supraventricular tachycardia: Secondary | ICD-10-CM | POA: Diagnosis not present

## 2022-07-27 DIAGNOSIS — R609 Edema, unspecified: Secondary | ICD-10-CM | POA: Diagnosis not present

## 2022-07-27 DIAGNOSIS — I4891 Unspecified atrial fibrillation: Secondary | ICD-10-CM | POA: Diagnosis not present

## 2022-07-27 DIAGNOSIS — I4581 Long QT syndrome: Secondary | ICD-10-CM | POA: Diagnosis not present

## 2022-07-27 DIAGNOSIS — E119 Type 2 diabetes mellitus without complications: Secondary | ICD-10-CM | POA: Diagnosis not present

## 2022-07-27 DIAGNOSIS — L89312 Pressure ulcer of right buttock, stage 2: Secondary | ICD-10-CM | POA: Diagnosis not present

## 2022-07-27 DIAGNOSIS — J90 Pleural effusion, not elsewhere classified: Secondary | ICD-10-CM | POA: Diagnosis not present

## 2022-07-27 DIAGNOSIS — E1141 Type 2 diabetes mellitus with diabetic mononeuropathy: Secondary | ICD-10-CM | POA: Diagnosis not present

## 2022-07-27 DIAGNOSIS — Z794 Long term (current) use of insulin: Secondary | ICD-10-CM | POA: Diagnosis not present

## 2022-07-27 DIAGNOSIS — J811 Chronic pulmonary edema: Secondary | ICD-10-CM | POA: Diagnosis not present

## 2022-07-27 DIAGNOSIS — I509 Heart failure, unspecified: Secondary | ICD-10-CM | POA: Diagnosis not present

## 2022-07-27 DIAGNOSIS — I5020 Unspecified systolic (congestive) heart failure: Secondary | ICD-10-CM | POA: Diagnosis not present

## 2022-07-27 DIAGNOSIS — I951 Orthostatic hypotension: Secondary | ICD-10-CM | POA: Diagnosis not present

## 2022-07-27 DIAGNOSIS — I890 Lymphedema, not elsewhere classified: Secondary | ICD-10-CM

## 2022-07-27 DIAGNOSIS — Z7901 Long term (current) use of anticoagulants: Secondary | ICD-10-CM | POA: Diagnosis not present

## 2022-07-27 DIAGNOSIS — N390 Urinary tract infection, site not specified: Secondary | ICD-10-CM | POA: Diagnosis not present

## 2022-07-27 DIAGNOSIS — I4892 Unspecified atrial flutter: Secondary | ICD-10-CM | POA: Diagnosis not present

## 2022-07-27 DIAGNOSIS — E669 Obesity, unspecified: Secondary | ICD-10-CM | POA: Diagnosis not present

## 2022-07-27 DIAGNOSIS — Z7409 Other reduced mobility: Secondary | ICD-10-CM | POA: Diagnosis not present

## 2022-07-27 DIAGNOSIS — E873 Alkalosis: Secondary | ICD-10-CM | POA: Diagnosis not present

## 2022-07-27 DIAGNOSIS — D72829 Elevated white blood cell count, unspecified: Secondary | ICD-10-CM | POA: Diagnosis not present

## 2022-07-27 DIAGNOSIS — R2681 Unsteadiness on feet: Secondary | ICD-10-CM | POA: Diagnosis not present

## 2022-07-27 DIAGNOSIS — R9431 Abnormal electrocardiogram [ECG] [EKG]: Secondary | ICD-10-CM | POA: Diagnosis not present

## 2022-07-27 DIAGNOSIS — Z789 Other specified health status: Secondary | ICD-10-CM | POA: Diagnosis not present

## 2022-07-27 DIAGNOSIS — E876 Hypokalemia: Secondary | ICD-10-CM | POA: Diagnosis not present

## 2022-07-27 DIAGNOSIS — R000 Tachycardia, unspecified: Secondary | ICD-10-CM | POA: Diagnosis not present

## 2022-07-27 DIAGNOSIS — I2510 Atherosclerotic heart disease of native coronary artery without angina pectoris: Secondary | ICD-10-CM | POA: Diagnosis not present

## 2022-07-27 DIAGNOSIS — K7460 Unspecified cirrhosis of liver: Secondary | ICD-10-CM | POA: Diagnosis not present

## 2022-07-27 DIAGNOSIS — E785 Hyperlipidemia, unspecified: Secondary | ICD-10-CM | POA: Diagnosis not present

## 2022-07-27 DIAGNOSIS — I5030 Unspecified diastolic (congestive) heart failure: Secondary | ICD-10-CM | POA: Diagnosis not present

## 2022-07-27 DIAGNOSIS — S3992XA Unspecified injury of lower back, initial encounter: Secondary | ICD-10-CM | POA: Diagnosis not present

## 2022-07-27 DIAGNOSIS — R600 Localized edema: Secondary | ICD-10-CM | POA: Diagnosis not present

## 2022-07-27 DIAGNOSIS — I517 Cardiomegaly: Secondary | ICD-10-CM | POA: Diagnosis not present

## 2022-07-27 DIAGNOSIS — R41841 Cognitive communication deficit: Secondary | ICD-10-CM | POA: Diagnosis not present

## 2022-07-27 DIAGNOSIS — I480 Paroxysmal atrial fibrillation: Secondary | ICD-10-CM | POA: Diagnosis not present

## 2022-07-27 DIAGNOSIS — R197 Diarrhea, unspecified: Secondary | ICD-10-CM | POA: Diagnosis not present

## 2022-07-27 DIAGNOSIS — I4510 Unspecified right bundle-branch block: Secondary | ICD-10-CM | POA: Diagnosis not present

## 2022-07-27 DIAGNOSIS — L89152 Pressure ulcer of sacral region, stage 2: Secondary | ICD-10-CM | POA: Diagnosis not present

## 2022-07-27 DIAGNOSIS — R531 Weakness: Secondary | ICD-10-CM | POA: Diagnosis not present

## 2022-07-27 DIAGNOSIS — I110 Hypertensive heart disease with heart failure: Secondary | ICD-10-CM | POA: Diagnosis not present

## 2022-07-27 DIAGNOSIS — I10 Essential (primary) hypertension: Secondary | ICD-10-CM | POA: Diagnosis not present

## 2022-07-27 DIAGNOSIS — R262 Difficulty in walking, not elsewhere classified: Secondary | ICD-10-CM | POA: Diagnosis not present

## 2022-07-27 DIAGNOSIS — J9811 Atelectasis: Secondary | ICD-10-CM | POA: Diagnosis not present

## 2022-07-27 DIAGNOSIS — E039 Hypothyroidism, unspecified: Secondary | ICD-10-CM | POA: Diagnosis not present

## 2022-07-27 DIAGNOSIS — I5033 Acute on chronic diastolic (congestive) heart failure: Secondary | ICD-10-CM | POA: Diagnosis not present

## 2022-07-27 DIAGNOSIS — R911 Solitary pulmonary nodule: Secondary | ICD-10-CM | POA: Diagnosis not present

## 2022-07-27 DIAGNOSIS — I482 Chronic atrial fibrillation, unspecified: Secondary | ICD-10-CM | POA: Diagnosis not present

## 2022-07-27 DIAGNOSIS — F39 Unspecified mood [affective] disorder: Secondary | ICD-10-CM | POA: Diagnosis not present

## 2022-07-27 DIAGNOSIS — E11622 Type 2 diabetes mellitus with other skin ulcer: Secondary | ICD-10-CM | POA: Diagnosis not present

## 2022-07-27 DIAGNOSIS — Z743 Need for continuous supervision: Secondary | ICD-10-CM | POA: Diagnosis not present

## 2022-07-27 DIAGNOSIS — R55 Syncope and collapse: Secondary | ICD-10-CM | POA: Diagnosis not present

## 2022-08-14 DIAGNOSIS — N390 Urinary tract infection, site not specified: Secondary | ICD-10-CM | POA: Diagnosis not present

## 2022-08-14 DIAGNOSIS — I5030 Unspecified diastolic (congestive) heart failure: Secondary | ICD-10-CM | POA: Diagnosis not present

## 2022-08-14 DIAGNOSIS — F419 Anxiety disorder, unspecified: Secondary | ICD-10-CM | POA: Diagnosis not present

## 2022-08-14 DIAGNOSIS — I4891 Unspecified atrial fibrillation: Secondary | ICD-10-CM | POA: Diagnosis not present

## 2022-08-14 DIAGNOSIS — M6281 Muscle weakness (generalized): Secondary | ICD-10-CM | POA: Diagnosis not present

## 2022-08-14 DIAGNOSIS — R001 Bradycardia, unspecified: Secondary | ICD-10-CM | POA: Diagnosis not present

## 2022-08-14 DIAGNOSIS — E119 Type 2 diabetes mellitus without complications: Secondary | ICD-10-CM | POA: Diagnosis not present

## 2022-08-14 DIAGNOSIS — I959 Hypotension, unspecified: Secondary | ICD-10-CM | POA: Diagnosis not present

## 2022-08-14 DIAGNOSIS — Z79899 Other long term (current) drug therapy: Secondary | ICD-10-CM | POA: Diagnosis not present

## 2022-08-14 DIAGNOSIS — R262 Difficulty in walking, not elsewhere classified: Secondary | ICD-10-CM | POA: Diagnosis not present

## 2022-08-14 DIAGNOSIS — J9811 Atelectasis: Secondary | ICD-10-CM | POA: Diagnosis not present

## 2022-08-14 DIAGNOSIS — I5032 Chronic diastolic (congestive) heart failure: Secondary | ICD-10-CM | POA: Diagnosis not present

## 2022-08-14 DIAGNOSIS — I480 Paroxysmal atrial fibrillation: Secondary | ICD-10-CM | POA: Diagnosis not present

## 2022-08-14 DIAGNOSIS — E785 Hyperlipidemia, unspecified: Secondary | ICD-10-CM | POA: Diagnosis not present

## 2022-08-14 DIAGNOSIS — R55 Syncope and collapse: Secondary | ICD-10-CM | POA: Diagnosis not present

## 2022-08-14 DIAGNOSIS — I951 Orthostatic hypotension: Secondary | ICD-10-CM | POA: Diagnosis present

## 2022-08-14 DIAGNOSIS — I10 Essential (primary) hypertension: Secondary | ICD-10-CM | POA: Diagnosis not present

## 2022-08-14 DIAGNOSIS — R0689 Other abnormalities of breathing: Secondary | ICD-10-CM | POA: Diagnosis not present

## 2022-08-14 DIAGNOSIS — Z743 Need for continuous supervision: Secondary | ICD-10-CM | POA: Diagnosis not present

## 2022-08-14 DIAGNOSIS — I890 Lymphedema, not elsewhere classified: Secondary | ICD-10-CM | POA: Diagnosis not present

## 2022-08-14 DIAGNOSIS — E8809 Other disorders of plasma-protein metabolism, not elsewhere classified: Secondary | ICD-10-CM | POA: Diagnosis not present

## 2022-08-14 DIAGNOSIS — G4733 Obstructive sleep apnea (adult) (pediatric): Secondary | ICD-10-CM | POA: Diagnosis not present

## 2022-08-14 DIAGNOSIS — I110 Hypertensive heart disease with heart failure: Secondary | ICD-10-CM | POA: Diagnosis not present

## 2022-08-14 DIAGNOSIS — Z7901 Long term (current) use of anticoagulants: Secondary | ICD-10-CM | POA: Diagnosis not present

## 2022-08-14 DIAGNOSIS — K449 Diaphragmatic hernia without obstruction or gangrene: Secondary | ICD-10-CM | POA: Diagnosis not present

## 2022-08-14 DIAGNOSIS — I5020 Unspecified systolic (congestive) heart failure: Secondary | ICD-10-CM | POA: Diagnosis not present

## 2022-08-14 DIAGNOSIS — N3090 Cystitis, unspecified without hematuria: Secondary | ICD-10-CM | POA: Diagnosis not present

## 2022-08-14 DIAGNOSIS — Z87891 Personal history of nicotine dependence: Secondary | ICD-10-CM | POA: Diagnosis not present

## 2022-08-14 DIAGNOSIS — R2681 Unsteadiness on feet: Secondary | ICD-10-CM | POA: Diagnosis not present

## 2022-08-14 DIAGNOSIS — R531 Weakness: Secondary | ICD-10-CM | POA: Diagnosis not present

## 2022-08-14 DIAGNOSIS — D72829 Elevated white blood cell count, unspecified: Secondary | ICD-10-CM | POA: Diagnosis not present

## 2022-08-14 DIAGNOSIS — R42 Dizziness and giddiness: Secondary | ICD-10-CM | POA: Diagnosis not present

## 2022-08-14 DIAGNOSIS — E039 Hypothyroidism, unspecified: Secondary | ICD-10-CM | POA: Diagnosis not present

## 2022-08-14 DIAGNOSIS — R41841 Cognitive communication deficit: Secondary | ICD-10-CM | POA: Diagnosis not present

## 2022-08-14 DIAGNOSIS — I2510 Atherosclerotic heart disease of native coronary artery without angina pectoris: Secondary | ICD-10-CM | POA: Diagnosis not present

## 2022-08-17 DIAGNOSIS — I951 Orthostatic hypotension: Secondary | ICD-10-CM | POA: Diagnosis not present

## 2022-08-17 DIAGNOSIS — E785 Hyperlipidemia, unspecified: Secondary | ICD-10-CM | POA: Diagnosis not present

## 2022-08-17 DIAGNOSIS — I5032 Chronic diastolic (congestive) heart failure: Secondary | ICD-10-CM | POA: Diagnosis not present

## 2022-08-17 DIAGNOSIS — F419 Anxiety disorder, unspecified: Secondary | ICD-10-CM | POA: Diagnosis not present

## 2022-08-17 DIAGNOSIS — E119 Type 2 diabetes mellitus without complications: Secondary | ICD-10-CM | POA: Diagnosis not present

## 2022-08-17 DIAGNOSIS — I5020 Unspecified systolic (congestive) heart failure: Secondary | ICD-10-CM | POA: Diagnosis not present

## 2022-08-17 DIAGNOSIS — I890 Lymphedema, not elsewhere classified: Secondary | ICD-10-CM | POA: Diagnosis not present

## 2022-08-17 DIAGNOSIS — I2510 Atherosclerotic heart disease of native coronary artery without angina pectoris: Secondary | ICD-10-CM | POA: Diagnosis not present

## 2022-08-17 DIAGNOSIS — I480 Paroxysmal atrial fibrillation: Secondary | ICD-10-CM | POA: Diagnosis not present

## 2022-08-18 DIAGNOSIS — I480 Paroxysmal atrial fibrillation: Secondary | ICD-10-CM | POA: Diagnosis not present

## 2022-08-18 DIAGNOSIS — E785 Hyperlipidemia, unspecified: Secondary | ICD-10-CM | POA: Diagnosis not present

## 2022-08-18 DIAGNOSIS — I951 Orthostatic hypotension: Secondary | ICD-10-CM | POA: Diagnosis not present

## 2022-08-18 DIAGNOSIS — I5020 Unspecified systolic (congestive) heart failure: Secondary | ICD-10-CM | POA: Diagnosis not present

## 2022-08-20 DIAGNOSIS — I2510 Atherosclerotic heart disease of native coronary artery without angina pectoris: Secondary | ICD-10-CM | POA: Diagnosis not present

## 2022-08-20 DIAGNOSIS — I5032 Chronic diastolic (congestive) heart failure: Secondary | ICD-10-CM | POA: Diagnosis not present

## 2022-08-20 DIAGNOSIS — E785 Hyperlipidemia, unspecified: Secondary | ICD-10-CM | POA: Diagnosis not present

## 2022-08-20 DIAGNOSIS — F419 Anxiety disorder, unspecified: Secondary | ICD-10-CM | POA: Diagnosis not present

## 2022-08-20 DIAGNOSIS — E119 Type 2 diabetes mellitus without complications: Secondary | ICD-10-CM | POA: Diagnosis not present

## 2022-08-20 DIAGNOSIS — I480 Paroxysmal atrial fibrillation: Secondary | ICD-10-CM | POA: Diagnosis not present

## 2022-08-20 DIAGNOSIS — I890 Lymphedema, not elsewhere classified: Secondary | ICD-10-CM | POA: Diagnosis not present

## 2022-08-21 DIAGNOSIS — I480 Paroxysmal atrial fibrillation: Secondary | ICD-10-CM | POA: Diagnosis not present

## 2022-08-21 DIAGNOSIS — I951 Orthostatic hypotension: Secondary | ICD-10-CM | POA: Diagnosis not present

## 2022-08-21 DIAGNOSIS — I5020 Unspecified systolic (congestive) heart failure: Secondary | ICD-10-CM | POA: Diagnosis not present

## 2022-08-21 DIAGNOSIS — E119 Type 2 diabetes mellitus without complications: Secondary | ICD-10-CM | POA: Diagnosis not present

## 2022-08-24 DIAGNOSIS — R001 Bradycardia, unspecified: Secondary | ICD-10-CM | POA: Diagnosis not present

## 2022-08-24 DIAGNOSIS — I959 Hypotension, unspecified: Secondary | ICD-10-CM | POA: Diagnosis not present

## 2022-08-24 DIAGNOSIS — G4733 Obstructive sleep apnea (adult) (pediatric): Secondary | ICD-10-CM | POA: Diagnosis not present

## 2022-08-24 DIAGNOSIS — K760 Fatty (change of) liver, not elsewhere classified: Secondary | ICD-10-CM | POA: Diagnosis not present

## 2022-08-24 DIAGNOSIS — I951 Orthostatic hypotension: Secondary | ICD-10-CM | POA: Diagnosis not present

## 2022-08-24 DIAGNOSIS — F419 Anxiety disorder, unspecified: Secondary | ICD-10-CM | POA: Diagnosis not present

## 2022-08-24 DIAGNOSIS — I5020 Unspecified systolic (congestive) heart failure: Secondary | ICD-10-CM | POA: Diagnosis not present

## 2022-08-24 DIAGNOSIS — E039 Hypothyroidism, unspecified: Secondary | ICD-10-CM | POA: Diagnosis not present

## 2022-08-24 DIAGNOSIS — Z7984 Long term (current) use of oral hypoglycemic drugs: Secondary | ICD-10-CM | POA: Diagnosis not present

## 2022-08-24 DIAGNOSIS — Z743 Need for continuous supervision: Secondary | ICD-10-CM | POA: Diagnosis not present

## 2022-08-24 DIAGNOSIS — M6281 Muscle weakness (generalized): Secondary | ICD-10-CM | POA: Diagnosis not present

## 2022-08-24 DIAGNOSIS — R2689 Other abnormalities of gait and mobility: Secondary | ICD-10-CM | POA: Diagnosis not present

## 2022-08-24 DIAGNOSIS — Z7901 Long term (current) use of anticoagulants: Secondary | ICD-10-CM | POA: Diagnosis not present

## 2022-08-24 DIAGNOSIS — K449 Diaphragmatic hernia without obstruction or gangrene: Secondary | ICD-10-CM | POA: Diagnosis not present

## 2022-08-24 DIAGNOSIS — N390 Urinary tract infection, site not specified: Secondary | ICD-10-CM | POA: Diagnosis not present

## 2022-08-24 DIAGNOSIS — N3090 Cystitis, unspecified without hematuria: Secondary | ICD-10-CM | POA: Diagnosis not present

## 2022-08-24 DIAGNOSIS — E785 Hyperlipidemia, unspecified: Secondary | ICD-10-CM | POA: Diagnosis not present

## 2022-08-24 DIAGNOSIS — R55 Syncope and collapse: Secondary | ICD-10-CM | POA: Diagnosis not present

## 2022-08-24 DIAGNOSIS — R531 Weakness: Secondary | ICD-10-CM | POA: Diagnosis not present

## 2022-08-24 DIAGNOSIS — E8809 Other disorders of plasma-protein metabolism, not elsewhere classified: Secondary | ICD-10-CM | POA: Diagnosis not present

## 2022-08-24 DIAGNOSIS — I110 Hypertensive heart disease with heart failure: Secondary | ICD-10-CM | POA: Diagnosis not present

## 2022-08-24 DIAGNOSIS — Z87891 Personal history of nicotine dependence: Secondary | ICD-10-CM | POA: Diagnosis not present

## 2022-08-24 DIAGNOSIS — E119 Type 2 diabetes mellitus without complications: Secondary | ICD-10-CM | POA: Diagnosis not present

## 2022-08-24 DIAGNOSIS — R0689 Other abnormalities of breathing: Secondary | ICD-10-CM | POA: Diagnosis not present

## 2022-08-24 DIAGNOSIS — I5030 Unspecified diastolic (congestive) heart failure: Secondary | ICD-10-CM | POA: Diagnosis not present

## 2022-08-24 DIAGNOSIS — I890 Lymphedema, not elsewhere classified: Secondary | ICD-10-CM | POA: Diagnosis not present

## 2022-08-24 DIAGNOSIS — I480 Paroxysmal atrial fibrillation: Secondary | ICD-10-CM | POA: Diagnosis not present

## 2022-08-24 DIAGNOSIS — R41841 Cognitive communication deficit: Secondary | ICD-10-CM | POA: Diagnosis not present

## 2022-08-24 DIAGNOSIS — I2510 Atherosclerotic heart disease of native coronary artery without angina pectoris: Secondary | ICD-10-CM | POA: Diagnosis not present

## 2022-08-24 DIAGNOSIS — I10 Essential (primary) hypertension: Secondary | ICD-10-CM | POA: Diagnosis not present

## 2022-08-24 DIAGNOSIS — I5032 Chronic diastolic (congestive) heart failure: Secondary | ICD-10-CM | POA: Diagnosis not present

## 2022-08-24 DIAGNOSIS — B9620 Unspecified Escherichia coli [E. coli] as the cause of diseases classified elsewhere: Secondary | ICD-10-CM | POA: Diagnosis not present

## 2022-08-24 DIAGNOSIS — I4510 Unspecified right bundle-branch block: Secondary | ICD-10-CM | POA: Diagnosis not present

## 2022-08-24 DIAGNOSIS — R42 Dizziness and giddiness: Secondary | ICD-10-CM | POA: Diagnosis not present

## 2022-08-24 DIAGNOSIS — J9811 Atelectasis: Secondary | ICD-10-CM | POA: Diagnosis not present

## 2022-08-24 DIAGNOSIS — I4891 Unspecified atrial fibrillation: Secondary | ICD-10-CM | POA: Diagnosis not present

## 2022-08-28 DIAGNOSIS — Z7901 Long term (current) use of anticoagulants: Secondary | ICD-10-CM | POA: Diagnosis not present

## 2022-08-28 DIAGNOSIS — F419 Anxiety disorder, unspecified: Secondary | ICD-10-CM | POA: Diagnosis not present

## 2022-08-28 DIAGNOSIS — I4891 Unspecified atrial fibrillation: Secondary | ICD-10-CM | POA: Diagnosis not present

## 2022-08-28 DIAGNOSIS — R110 Nausea: Secondary | ICD-10-CM | POA: Diagnosis not present

## 2022-08-28 DIAGNOSIS — I2510 Atherosclerotic heart disease of native coronary artery without angina pectoris: Secondary | ICD-10-CM | POA: Diagnosis not present

## 2022-08-28 DIAGNOSIS — I480 Paroxysmal atrial fibrillation: Secondary | ICD-10-CM | POA: Diagnosis not present

## 2022-08-28 DIAGNOSIS — Z7984 Long term (current) use of oral hypoglycemic drugs: Secondary | ICD-10-CM | POA: Diagnosis not present

## 2022-08-28 DIAGNOSIS — I509 Heart failure, unspecified: Secondary | ICD-10-CM | POA: Diagnosis not present

## 2022-08-28 DIAGNOSIS — R1011 Right upper quadrant pain: Secondary | ICD-10-CM | POA: Diagnosis not present

## 2022-08-28 DIAGNOSIS — B9620 Unspecified Escherichia coli [E. coli] as the cause of diseases classified elsewhere: Secondary | ICD-10-CM | POA: Diagnosis not present

## 2022-08-28 DIAGNOSIS — E872 Acidosis, unspecified: Secondary | ICD-10-CM | POA: Diagnosis not present

## 2022-08-28 DIAGNOSIS — Z743 Need for continuous supervision: Secondary | ICD-10-CM | POA: Diagnosis not present

## 2022-08-28 DIAGNOSIS — E860 Dehydration: Secondary | ICD-10-CM | POA: Diagnosis not present

## 2022-08-28 DIAGNOSIS — E118 Type 2 diabetes mellitus with unspecified complications: Secondary | ICD-10-CM | POA: Diagnosis not present

## 2022-08-28 DIAGNOSIS — I10 Essential (primary) hypertension: Secondary | ICD-10-CM | POA: Diagnosis not present

## 2022-08-28 DIAGNOSIS — I4892 Unspecified atrial flutter: Secondary | ICD-10-CM | POA: Diagnosis not present

## 2022-08-28 DIAGNOSIS — R2689 Other abnormalities of gait and mobility: Secondary | ICD-10-CM | POA: Diagnosis not present

## 2022-08-28 DIAGNOSIS — E119 Type 2 diabetes mellitus without complications: Secondary | ICD-10-CM | POA: Diagnosis not present

## 2022-08-28 DIAGNOSIS — M6281 Muscle weakness (generalized): Secondary | ICD-10-CM | POA: Diagnosis not present

## 2022-08-28 DIAGNOSIS — N3090 Cystitis, unspecified without hematuria: Secondary | ICD-10-CM | POA: Diagnosis not present

## 2022-08-28 DIAGNOSIS — I951 Orthostatic hypotension: Secondary | ICD-10-CM | POA: Diagnosis not present

## 2022-08-28 DIAGNOSIS — N390 Urinary tract infection, site not specified: Secondary | ICD-10-CM | POA: Diagnosis not present

## 2022-08-28 DIAGNOSIS — I890 Lymphedema, not elsewhere classified: Secondary | ICD-10-CM | POA: Diagnosis not present

## 2022-08-28 DIAGNOSIS — E039 Hypothyroidism, unspecified: Secondary | ICD-10-CM | POA: Diagnosis not present

## 2022-08-28 DIAGNOSIS — K912 Postsurgical malabsorption, not elsewhere classified: Secondary | ICD-10-CM | POA: Diagnosis not present

## 2022-08-28 DIAGNOSIS — R55 Syncope and collapse: Secondary | ICD-10-CM | POA: Diagnosis not present

## 2022-08-28 DIAGNOSIS — R000 Tachycardia, unspecified: Secondary | ICD-10-CM | POA: Diagnosis not present

## 2022-08-28 DIAGNOSIS — R112 Nausea with vomiting, unspecified: Secondary | ICD-10-CM | POA: Diagnosis not present

## 2022-08-28 DIAGNOSIS — E8809 Other disorders of plasma-protein metabolism, not elsewhere classified: Secondary | ICD-10-CM | POA: Diagnosis not present

## 2022-08-28 DIAGNOSIS — E1143 Type 2 diabetes mellitus with diabetic autonomic (poly)neuropathy: Secondary | ICD-10-CM | POA: Diagnosis not present

## 2022-08-28 DIAGNOSIS — R0602 Shortness of breath: Secondary | ICD-10-CM | POA: Diagnosis not present

## 2022-08-28 DIAGNOSIS — R531 Weakness: Secondary | ICD-10-CM | POA: Diagnosis not present

## 2022-08-28 DIAGNOSIS — K9049 Malabsorption due to intolerance, not elsewhere classified: Secondary | ICD-10-CM | POA: Diagnosis not present

## 2022-08-28 DIAGNOSIS — R41841 Cognitive communication deficit: Secondary | ICD-10-CM | POA: Diagnosis not present

## 2022-08-28 DIAGNOSIS — R197 Diarrhea, unspecified: Secondary | ICD-10-CM | POA: Diagnosis not present

## 2022-08-28 DIAGNOSIS — E43 Unspecified severe protein-calorie malnutrition: Secondary | ICD-10-CM | POA: Diagnosis not present

## 2022-08-28 DIAGNOSIS — I4719 Other supraventricular tachycardia: Secondary | ICD-10-CM | POA: Diagnosis not present

## 2022-08-28 DIAGNOSIS — E785 Hyperlipidemia, unspecified: Secondary | ICD-10-CM | POA: Diagnosis not present

## 2022-08-28 DIAGNOSIS — E46 Unspecified protein-calorie malnutrition: Secondary | ICD-10-CM | POA: Diagnosis not present

## 2022-08-28 DIAGNOSIS — K8020 Calculus of gallbladder without cholecystitis without obstruction: Secondary | ICD-10-CM | POA: Diagnosis not present

## 2022-08-28 DIAGNOSIS — I5020 Unspecified systolic (congestive) heart failure: Secondary | ICD-10-CM | POA: Diagnosis not present

## 2022-08-31 DIAGNOSIS — E118 Type 2 diabetes mellitus with unspecified complications: Secondary | ICD-10-CM | POA: Diagnosis not present

## 2022-08-31 DIAGNOSIS — I509 Heart failure, unspecified: Secondary | ICD-10-CM | POA: Diagnosis not present

## 2022-08-31 DIAGNOSIS — I2510 Atherosclerotic heart disease of native coronary artery without angina pectoris: Secondary | ICD-10-CM | POA: Diagnosis not present

## 2022-08-31 DIAGNOSIS — R55 Syncope and collapse: Secondary | ICD-10-CM | POA: Diagnosis not present

## 2022-08-31 DIAGNOSIS — F419 Anxiety disorder, unspecified: Secondary | ICD-10-CM | POA: Diagnosis not present

## 2022-08-31 DIAGNOSIS — E785 Hyperlipidemia, unspecified: Secondary | ICD-10-CM | POA: Diagnosis not present

## 2022-08-31 DIAGNOSIS — I890 Lymphedema, not elsewhere classified: Secondary | ICD-10-CM | POA: Diagnosis not present

## 2022-08-31 DIAGNOSIS — I480 Paroxysmal atrial fibrillation: Secondary | ICD-10-CM | POA: Diagnosis not present

## 2022-08-31 DIAGNOSIS — N390 Urinary tract infection, site not specified: Secondary | ICD-10-CM | POA: Diagnosis not present

## 2022-09-01 DIAGNOSIS — I951 Orthostatic hypotension: Secondary | ICD-10-CM | POA: Diagnosis not present

## 2022-09-01 DIAGNOSIS — R55 Syncope and collapse: Secondary | ICD-10-CM | POA: Diagnosis not present

## 2022-09-01 DIAGNOSIS — I5020 Unspecified systolic (congestive) heart failure: Secondary | ICD-10-CM | POA: Diagnosis not present

## 2022-09-01 DIAGNOSIS — I480 Paroxysmal atrial fibrillation: Secondary | ICD-10-CM | POA: Diagnosis not present

## 2022-09-02 DIAGNOSIS — I5020 Unspecified systolic (congestive) heart failure: Secondary | ICD-10-CM | POA: Diagnosis not present

## 2022-09-02 DIAGNOSIS — M6281 Muscle weakness (generalized): Secondary | ICD-10-CM | POA: Diagnosis not present

## 2022-09-02 DIAGNOSIS — I480 Paroxysmal atrial fibrillation: Secondary | ICD-10-CM | POA: Diagnosis not present

## 2022-09-02 DIAGNOSIS — R55 Syncope and collapse: Secondary | ICD-10-CM | POA: Diagnosis not present

## 2022-09-07 DIAGNOSIS — E118 Type 2 diabetes mellitus with unspecified complications: Secondary | ICD-10-CM | POA: Diagnosis not present

## 2022-09-07 DIAGNOSIS — I2510 Atherosclerotic heart disease of native coronary artery without angina pectoris: Secondary | ICD-10-CM | POA: Diagnosis not present

## 2022-09-07 DIAGNOSIS — I509 Heart failure, unspecified: Secondary | ICD-10-CM | POA: Diagnosis not present

## 2022-09-07 DIAGNOSIS — R55 Syncope and collapse: Secondary | ICD-10-CM | POA: Diagnosis not present

## 2022-09-07 DIAGNOSIS — I890 Lymphedema, not elsewhere classified: Secondary | ICD-10-CM | POA: Diagnosis not present

## 2022-09-07 DIAGNOSIS — E785 Hyperlipidemia, unspecified: Secondary | ICD-10-CM | POA: Diagnosis not present

## 2022-09-07 DIAGNOSIS — I480 Paroxysmal atrial fibrillation: Secondary | ICD-10-CM | POA: Diagnosis not present

## 2022-09-07 DIAGNOSIS — F419 Anxiety disorder, unspecified: Secondary | ICD-10-CM | POA: Diagnosis not present

## 2022-09-07 DIAGNOSIS — N390 Urinary tract infection, site not specified: Secondary | ICD-10-CM | POA: Diagnosis not present

## 2022-09-10 DIAGNOSIS — I890 Lymphedema, not elsewhere classified: Secondary | ICD-10-CM | POA: Diagnosis not present

## 2022-09-10 DIAGNOSIS — E785 Hyperlipidemia, unspecified: Secondary | ICD-10-CM | POA: Diagnosis not present

## 2022-09-10 DIAGNOSIS — I509 Heart failure, unspecified: Secondary | ICD-10-CM | POA: Diagnosis not present

## 2022-09-10 DIAGNOSIS — R55 Syncope and collapse: Secondary | ICD-10-CM | POA: Diagnosis not present

## 2022-09-10 DIAGNOSIS — F419 Anxiety disorder, unspecified: Secondary | ICD-10-CM | POA: Diagnosis not present

## 2022-09-10 DIAGNOSIS — I2510 Atherosclerotic heart disease of native coronary artery without angina pectoris: Secondary | ICD-10-CM | POA: Diagnosis not present

## 2022-09-10 DIAGNOSIS — E118 Type 2 diabetes mellitus with unspecified complications: Secondary | ICD-10-CM | POA: Diagnosis not present

## 2022-09-10 DIAGNOSIS — N390 Urinary tract infection, site not specified: Secondary | ICD-10-CM | POA: Diagnosis not present

## 2022-09-10 DIAGNOSIS — I480 Paroxysmal atrial fibrillation: Secondary | ICD-10-CM | POA: Diagnosis not present

## 2022-09-14 DIAGNOSIS — I509 Heart failure, unspecified: Secondary | ICD-10-CM | POA: Diagnosis not present

## 2022-09-14 DIAGNOSIS — I480 Paroxysmal atrial fibrillation: Secondary | ICD-10-CM | POA: Diagnosis not present

## 2022-09-14 DIAGNOSIS — E785 Hyperlipidemia, unspecified: Secondary | ICD-10-CM | POA: Diagnosis not present

## 2022-09-14 DIAGNOSIS — F419 Anxiety disorder, unspecified: Secondary | ICD-10-CM | POA: Diagnosis not present

## 2022-09-14 DIAGNOSIS — I2510 Atherosclerotic heart disease of native coronary artery without angina pectoris: Secondary | ICD-10-CM | POA: Diagnosis not present

## 2022-09-14 DIAGNOSIS — I890 Lymphedema, not elsewhere classified: Secondary | ICD-10-CM | POA: Diagnosis not present

## 2022-09-14 DIAGNOSIS — R55 Syncope and collapse: Secondary | ICD-10-CM | POA: Diagnosis not present

## 2022-09-14 DIAGNOSIS — E118 Type 2 diabetes mellitus with unspecified complications: Secondary | ICD-10-CM | POA: Diagnosis not present

## 2022-09-14 DIAGNOSIS — N390 Urinary tract infection, site not specified: Secondary | ICD-10-CM | POA: Diagnosis not present

## 2022-09-16 DIAGNOSIS — R55 Syncope and collapse: Secondary | ICD-10-CM | POA: Diagnosis not present

## 2022-09-16 DIAGNOSIS — R112 Nausea with vomiting, unspecified: Secondary | ICD-10-CM | POA: Diagnosis not present

## 2022-09-17 DIAGNOSIS — I10 Essential (primary) hypertension: Secondary | ICD-10-CM | POA: Diagnosis not present

## 2022-09-17 DIAGNOSIS — I2510 Atherosclerotic heart disease of native coronary artery without angina pectoris: Secondary | ICD-10-CM | POA: Diagnosis not present

## 2022-09-17 DIAGNOSIS — E118 Type 2 diabetes mellitus with unspecified complications: Secondary | ICD-10-CM | POA: Diagnosis not present

## 2022-09-17 DIAGNOSIS — F419 Anxiety disorder, unspecified: Secondary | ICD-10-CM | POA: Diagnosis not present

## 2022-09-17 DIAGNOSIS — I480 Paroxysmal atrial fibrillation: Secondary | ICD-10-CM | POA: Diagnosis not present

## 2022-09-17 DIAGNOSIS — E785 Hyperlipidemia, unspecified: Secondary | ICD-10-CM | POA: Diagnosis not present

## 2022-09-17 DIAGNOSIS — I509 Heart failure, unspecified: Secondary | ICD-10-CM | POA: Diagnosis not present

## 2022-09-17 DIAGNOSIS — R55 Syncope and collapse: Secondary | ICD-10-CM | POA: Diagnosis not present

## 2022-09-17 DIAGNOSIS — E119 Type 2 diabetes mellitus without complications: Secondary | ICD-10-CM | POA: Diagnosis not present

## 2022-09-17 DIAGNOSIS — I890 Lymphedema, not elsewhere classified: Secondary | ICD-10-CM | POA: Diagnosis not present

## 2022-09-17 DIAGNOSIS — N390 Urinary tract infection, site not specified: Secondary | ICD-10-CM | POA: Diagnosis not present

## 2022-09-21 DIAGNOSIS — I480 Paroxysmal atrial fibrillation: Secondary | ICD-10-CM | POA: Diagnosis not present

## 2022-09-21 DIAGNOSIS — N390 Urinary tract infection, site not specified: Secondary | ICD-10-CM | POA: Diagnosis not present

## 2022-09-21 DIAGNOSIS — R55 Syncope and collapse: Secondary | ICD-10-CM | POA: Diagnosis not present

## 2022-09-21 DIAGNOSIS — E785 Hyperlipidemia, unspecified: Secondary | ICD-10-CM | POA: Diagnosis not present

## 2022-09-21 DIAGNOSIS — I2510 Atherosclerotic heart disease of native coronary artery without angina pectoris: Secondary | ICD-10-CM | POA: Diagnosis not present

## 2022-09-21 DIAGNOSIS — I890 Lymphedema, not elsewhere classified: Secondary | ICD-10-CM | POA: Diagnosis not present

## 2022-09-21 DIAGNOSIS — F419 Anxiety disorder, unspecified: Secondary | ICD-10-CM | POA: Diagnosis not present

## 2022-09-21 DIAGNOSIS — I509 Heart failure, unspecified: Secondary | ICD-10-CM | POA: Diagnosis not present

## 2022-09-21 DIAGNOSIS — E118 Type 2 diabetes mellitus with unspecified complications: Secondary | ICD-10-CM | POA: Diagnosis not present

## 2022-09-24 DIAGNOSIS — I951 Orthostatic hypotension: Secondary | ICD-10-CM | POA: Diagnosis not present

## 2022-09-24 DIAGNOSIS — E118 Type 2 diabetes mellitus with unspecified complications: Secondary | ICD-10-CM | POA: Diagnosis not present

## 2022-09-24 DIAGNOSIS — E43 Unspecified severe protein-calorie malnutrition: Secondary | ICD-10-CM | POA: Diagnosis not present

## 2022-09-24 DIAGNOSIS — I2510 Atherosclerotic heart disease of native coronary artery without angina pectoris: Secondary | ICD-10-CM | POA: Diagnosis not present

## 2022-09-24 DIAGNOSIS — I890 Lymphedema, not elsewhere classified: Secondary | ICD-10-CM | POA: Diagnosis not present

## 2022-09-24 DIAGNOSIS — E785 Hyperlipidemia, unspecified: Secondary | ICD-10-CM | POA: Diagnosis not present

## 2022-09-24 DIAGNOSIS — R55 Syncope and collapse: Secondary | ICD-10-CM | POA: Diagnosis not present

## 2022-09-24 DIAGNOSIS — I480 Paroxysmal atrial fibrillation: Secondary | ICD-10-CM | POA: Diagnosis not present

## 2022-09-24 DIAGNOSIS — N390 Urinary tract infection, site not specified: Secondary | ICD-10-CM | POA: Diagnosis not present

## 2022-09-24 DIAGNOSIS — I509 Heart failure, unspecified: Secondary | ICD-10-CM | POA: Diagnosis not present

## 2022-09-24 DIAGNOSIS — F419 Anxiety disorder, unspecified: Secondary | ICD-10-CM | POA: Diagnosis not present

## 2022-09-28 DIAGNOSIS — I480 Paroxysmal atrial fibrillation: Secondary | ICD-10-CM | POA: Diagnosis not present

## 2022-09-28 DIAGNOSIS — E118 Type 2 diabetes mellitus with unspecified complications: Secondary | ICD-10-CM | POA: Diagnosis not present

## 2022-09-28 DIAGNOSIS — I2510 Atherosclerotic heart disease of native coronary artery without angina pectoris: Secondary | ICD-10-CM | POA: Diagnosis not present

## 2022-09-28 DIAGNOSIS — I890 Lymphedema, not elsewhere classified: Secondary | ICD-10-CM | POA: Diagnosis not present

## 2022-09-28 DIAGNOSIS — R55 Syncope and collapse: Secondary | ICD-10-CM | POA: Diagnosis not present

## 2022-09-28 DIAGNOSIS — I509 Heart failure, unspecified: Secondary | ICD-10-CM | POA: Diagnosis not present

## 2022-09-28 DIAGNOSIS — F419 Anxiety disorder, unspecified: Secondary | ICD-10-CM | POA: Diagnosis not present

## 2022-09-28 DIAGNOSIS — N390 Urinary tract infection, site not specified: Secondary | ICD-10-CM | POA: Diagnosis not present

## 2022-09-28 DIAGNOSIS — E785 Hyperlipidemia, unspecified: Secondary | ICD-10-CM | POA: Diagnosis not present

## 2022-10-01 DIAGNOSIS — N390 Urinary tract infection, site not specified: Secondary | ICD-10-CM | POA: Diagnosis not present

## 2022-10-01 DIAGNOSIS — I2510 Atherosclerotic heart disease of native coronary artery without angina pectoris: Secondary | ICD-10-CM | POA: Diagnosis not present

## 2022-10-01 DIAGNOSIS — I509 Heart failure, unspecified: Secondary | ICD-10-CM | POA: Diagnosis not present

## 2022-10-01 DIAGNOSIS — E118 Type 2 diabetes mellitus with unspecified complications: Secondary | ICD-10-CM | POA: Diagnosis not present

## 2022-10-01 DIAGNOSIS — I5020 Unspecified systolic (congestive) heart failure: Secondary | ICD-10-CM | POA: Diagnosis not present

## 2022-10-01 DIAGNOSIS — I890 Lymphedema, not elsewhere classified: Secondary | ICD-10-CM | POA: Diagnosis not present

## 2022-10-01 DIAGNOSIS — R55 Syncope and collapse: Secondary | ICD-10-CM | POA: Diagnosis not present

## 2022-10-01 DIAGNOSIS — M6281 Muscle weakness (generalized): Secondary | ICD-10-CM | POA: Diagnosis not present

## 2022-10-01 DIAGNOSIS — I480 Paroxysmal atrial fibrillation: Secondary | ICD-10-CM | POA: Diagnosis not present

## 2022-10-01 DIAGNOSIS — F419 Anxiety disorder, unspecified: Secondary | ICD-10-CM | POA: Diagnosis not present

## 2022-10-01 DIAGNOSIS — E785 Hyperlipidemia, unspecified: Secondary | ICD-10-CM | POA: Diagnosis not present

## 2022-10-08 DIAGNOSIS — R55 Syncope and collapse: Secondary | ICD-10-CM | POA: Diagnosis not present

## 2022-10-08 DIAGNOSIS — E118 Type 2 diabetes mellitus with unspecified complications: Secondary | ICD-10-CM | POA: Diagnosis not present

## 2022-10-08 DIAGNOSIS — E785 Hyperlipidemia, unspecified: Secondary | ICD-10-CM | POA: Diagnosis not present

## 2022-10-08 DIAGNOSIS — I480 Paroxysmal atrial fibrillation: Secondary | ICD-10-CM | POA: Diagnosis not present

## 2022-10-08 DIAGNOSIS — I2510 Atherosclerotic heart disease of native coronary artery without angina pectoris: Secondary | ICD-10-CM | POA: Diagnosis not present

## 2022-10-08 DIAGNOSIS — N390 Urinary tract infection, site not specified: Secondary | ICD-10-CM | POA: Diagnosis not present

## 2022-10-08 DIAGNOSIS — I509 Heart failure, unspecified: Secondary | ICD-10-CM | POA: Diagnosis not present

## 2022-10-08 DIAGNOSIS — I890 Lymphedema, not elsewhere classified: Secondary | ICD-10-CM | POA: Diagnosis not present

## 2022-10-08 DIAGNOSIS — F419 Anxiety disorder, unspecified: Secondary | ICD-10-CM | POA: Diagnosis not present

## 2022-10-13 DIAGNOSIS — I10 Essential (primary) hypertension: Secondary | ICD-10-CM | POA: Diagnosis not present

## 2022-10-15 DIAGNOSIS — I890 Lymphedema, not elsewhere classified: Secondary | ICD-10-CM | POA: Diagnosis not present

## 2022-10-15 DIAGNOSIS — I480 Paroxysmal atrial fibrillation: Secondary | ICD-10-CM | POA: Diagnosis not present

## 2022-10-15 DIAGNOSIS — I509 Heart failure, unspecified: Secondary | ICD-10-CM | POA: Diagnosis not present

## 2022-10-15 DIAGNOSIS — E785 Hyperlipidemia, unspecified: Secondary | ICD-10-CM | POA: Diagnosis not present

## 2022-10-15 DIAGNOSIS — N390 Urinary tract infection, site not specified: Secondary | ICD-10-CM | POA: Diagnosis not present

## 2022-10-15 DIAGNOSIS — E118 Type 2 diabetes mellitus with unspecified complications: Secondary | ICD-10-CM | POA: Diagnosis not present

## 2022-10-15 DIAGNOSIS — F419 Anxiety disorder, unspecified: Secondary | ICD-10-CM | POA: Diagnosis not present

## 2022-10-15 DIAGNOSIS — R55 Syncope and collapse: Secondary | ICD-10-CM | POA: Diagnosis not present

## 2022-10-15 DIAGNOSIS — I2510 Atherosclerotic heart disease of native coronary artery without angina pectoris: Secondary | ICD-10-CM | POA: Diagnosis not present

## 2022-10-19 DIAGNOSIS — I480 Paroxysmal atrial fibrillation: Secondary | ICD-10-CM | POA: Diagnosis not present

## 2022-10-19 DIAGNOSIS — E118 Type 2 diabetes mellitus with unspecified complications: Secondary | ICD-10-CM | POA: Diagnosis not present

## 2022-10-19 DIAGNOSIS — E785 Hyperlipidemia, unspecified: Secondary | ICD-10-CM | POA: Diagnosis not present

## 2022-10-19 DIAGNOSIS — R55 Syncope and collapse: Secondary | ICD-10-CM | POA: Diagnosis not present

## 2022-10-19 DIAGNOSIS — F419 Anxiety disorder, unspecified: Secondary | ICD-10-CM | POA: Diagnosis not present

## 2022-10-19 DIAGNOSIS — I509 Heart failure, unspecified: Secondary | ICD-10-CM | POA: Diagnosis not present

## 2022-10-19 DIAGNOSIS — I2510 Atherosclerotic heart disease of native coronary artery without angina pectoris: Secondary | ICD-10-CM | POA: Diagnosis not present

## 2022-10-19 DIAGNOSIS — N390 Urinary tract infection, site not specified: Secondary | ICD-10-CM | POA: Diagnosis not present

## 2022-10-19 DIAGNOSIS — I890 Lymphedema, not elsewhere classified: Secondary | ICD-10-CM | POA: Diagnosis not present

## 2022-10-20 DIAGNOSIS — R634 Abnormal weight loss: Secondary | ICD-10-CM | POA: Diagnosis not present

## 2022-10-20 DIAGNOSIS — K9189 Other postprocedural complications and disorders of digestive system: Secondary | ICD-10-CM | POA: Diagnosis not present

## 2022-10-20 DIAGNOSIS — K8020 Calculus of gallbladder without cholecystitis without obstruction: Secondary | ICD-10-CM | POA: Diagnosis not present

## 2022-10-20 DIAGNOSIS — E8809 Other disorders of plasma-protein metabolism, not elsewhere classified: Secondary | ICD-10-CM | POA: Insufficient documentation

## 2022-10-20 DIAGNOSIS — R110 Nausea: Secondary | ICD-10-CM | POA: Diagnosis not present

## 2022-10-20 DIAGNOSIS — R1013 Epigastric pain: Secondary | ICD-10-CM | POA: Diagnosis not present

## 2022-10-20 DIAGNOSIS — R531 Weakness: Secondary | ICD-10-CM | POA: Diagnosis not present

## 2022-10-20 DIAGNOSIS — I890 Lymphedema, not elsewhere classified: Secondary | ICD-10-CM | POA: Diagnosis not present

## 2022-10-20 DIAGNOSIS — I4719 Other supraventricular tachycardia: Secondary | ICD-10-CM | POA: Diagnosis not present

## 2022-10-20 DIAGNOSIS — R770 Abnormality of albumin: Secondary | ICD-10-CM | POA: Diagnosis not present

## 2022-10-20 DIAGNOSIS — K912 Postsurgical malabsorption, not elsewhere classified: Secondary | ICD-10-CM | POA: Diagnosis not present

## 2022-10-20 DIAGNOSIS — D509 Iron deficiency anemia, unspecified: Secondary | ICD-10-CM | POA: Diagnosis not present

## 2022-10-20 DIAGNOSIS — I10 Essential (primary) hypertension: Secondary | ICD-10-CM | POA: Diagnosis not present

## 2022-10-20 DIAGNOSIS — E440 Moderate protein-calorie malnutrition: Secondary | ICD-10-CM | POA: Diagnosis not present

## 2022-10-20 DIAGNOSIS — N3000 Acute cystitis without hematuria: Secondary | ICD-10-CM | POA: Diagnosis not present

## 2022-10-20 DIAGNOSIS — E039 Hypothyroidism, unspecified: Secondary | ICD-10-CM | POA: Diagnosis not present

## 2022-10-20 DIAGNOSIS — E559 Vitamin D deficiency, unspecified: Secondary | ICD-10-CM | POA: Diagnosis not present

## 2022-10-20 DIAGNOSIS — R1011 Right upper quadrant pain: Secondary | ICD-10-CM | POA: Diagnosis not present

## 2022-10-20 DIAGNOSIS — E119 Type 2 diabetes mellitus without complications: Secondary | ICD-10-CM | POA: Diagnosis not present

## 2022-10-20 DIAGNOSIS — G8929 Other chronic pain: Secondary | ICD-10-CM | POA: Diagnosis not present

## 2022-10-20 DIAGNOSIS — E1143 Type 2 diabetes mellitus with diabetic autonomic (poly)neuropathy: Secondary | ICD-10-CM | POA: Diagnosis not present

## 2022-10-20 DIAGNOSIS — E872 Acidosis, unspecified: Secondary | ICD-10-CM | POA: Diagnosis not present

## 2022-10-20 DIAGNOSIS — I444 Left anterior fascicular block: Secondary | ICD-10-CM | POA: Diagnosis not present

## 2022-10-20 DIAGNOSIS — K529 Noninfective gastroenteritis and colitis, unspecified: Secondary | ICD-10-CM | POA: Diagnosis not present

## 2022-10-20 DIAGNOSIS — D649 Anemia, unspecified: Secondary | ICD-10-CM | POA: Diagnosis not present

## 2022-10-20 DIAGNOSIS — R1010 Upper abdominal pain, unspecified: Secondary | ICD-10-CM | POA: Diagnosis not present

## 2022-10-20 DIAGNOSIS — E46 Unspecified protein-calorie malnutrition: Secondary | ICD-10-CM | POA: Diagnosis not present

## 2022-10-20 DIAGNOSIS — R55 Syncope and collapse: Secondary | ICD-10-CM | POA: Diagnosis not present

## 2022-10-20 DIAGNOSIS — I5030 Unspecified diastolic (congestive) heart failure: Secondary | ICD-10-CM | POA: Diagnosis not present

## 2022-10-20 DIAGNOSIS — R079 Chest pain, unspecified: Secondary | ICD-10-CM | POA: Diagnosis not present

## 2022-10-20 DIAGNOSIS — R2243 Localized swelling, mass and lump, lower limb, bilateral: Secondary | ICD-10-CM | POA: Diagnosis not present

## 2022-10-20 DIAGNOSIS — E860 Dehydration: Secondary | ICD-10-CM | POA: Diagnosis not present

## 2022-10-20 DIAGNOSIS — R197 Diarrhea, unspecified: Secondary | ICD-10-CM | POA: Insufficient documentation

## 2022-10-20 DIAGNOSIS — J9811 Atelectasis: Secondary | ICD-10-CM | POA: Diagnosis not present

## 2022-10-20 DIAGNOSIS — R109 Unspecified abdominal pain: Secondary | ICD-10-CM | POA: Diagnosis not present

## 2022-10-20 DIAGNOSIS — Z794 Long term (current) use of insulin: Secondary | ICD-10-CM | POA: Diagnosis not present

## 2022-10-20 DIAGNOSIS — I959 Hypotension, unspecified: Secondary | ICD-10-CM | POA: Diagnosis not present

## 2022-10-20 DIAGNOSIS — R195 Other fecal abnormalities: Secondary | ICD-10-CM | POA: Diagnosis not present

## 2022-10-20 DIAGNOSIS — R42 Dizziness and giddiness: Secondary | ICD-10-CM | POA: Diagnosis not present

## 2022-10-20 DIAGNOSIS — K2950 Unspecified chronic gastritis without bleeding: Secondary | ICD-10-CM | POA: Diagnosis not present

## 2022-10-20 DIAGNOSIS — R112 Nausea with vomiting, unspecified: Secondary | ICD-10-CM | POA: Diagnosis not present

## 2022-10-20 DIAGNOSIS — Z743 Need for continuous supervision: Secondary | ICD-10-CM | POA: Diagnosis not present

## 2022-10-20 DIAGNOSIS — R000 Tachycardia, unspecified: Secondary | ICD-10-CM | POA: Diagnosis not present

## 2022-10-20 DIAGNOSIS — J90 Pleural effusion, not elsewhere classified: Secondary | ICD-10-CM | POA: Diagnosis not present

## 2022-10-20 DIAGNOSIS — I951 Orthostatic hypotension: Secondary | ICD-10-CM | POA: Diagnosis not present

## 2022-10-20 DIAGNOSIS — K9049 Malabsorption due to intolerance, not elsewhere classified: Secondary | ICD-10-CM | POA: Diagnosis not present

## 2022-10-20 DIAGNOSIS — R0602 Shortness of breath: Secondary | ICD-10-CM | POA: Diagnosis not present

## 2022-10-20 DIAGNOSIS — Z980 Intestinal bypass and anastomosis status: Secondary | ICD-10-CM | POA: Diagnosis not present

## 2022-10-20 DIAGNOSIS — K5900 Constipation, unspecified: Secondary | ICD-10-CM | POA: Diagnosis not present

## 2022-10-20 DIAGNOSIS — I480 Paroxysmal atrial fibrillation: Secondary | ICD-10-CM | POA: Diagnosis not present

## 2022-10-20 DIAGNOSIS — I452 Bifascicular block: Secondary | ICD-10-CM | POA: Diagnosis not present

## 2022-10-20 DIAGNOSIS — I491 Atrial premature depolarization: Secondary | ICD-10-CM | POA: Diagnosis not present

## 2022-10-20 DIAGNOSIS — I4510 Unspecified right bundle-branch block: Secondary | ICD-10-CM | POA: Diagnosis not present

## 2022-10-20 DIAGNOSIS — Z7901 Long term (current) use of anticoagulants: Secondary | ICD-10-CM | POA: Diagnosis not present

## 2022-10-20 DIAGNOSIS — I4892 Unspecified atrial flutter: Secondary | ICD-10-CM | POA: Diagnosis not present

## 2022-10-20 DIAGNOSIS — Z9889 Other specified postprocedural states: Secondary | ICD-10-CM | POA: Diagnosis not present

## 2022-10-20 DIAGNOSIS — I5032 Chronic diastolic (congestive) heart failure: Secondary | ICD-10-CM | POA: Diagnosis not present

## 2022-10-20 DIAGNOSIS — K449 Diaphragmatic hernia without obstruction or gangrene: Secondary | ICD-10-CM | POA: Diagnosis not present

## 2022-10-20 DIAGNOSIS — I4891 Unspecified atrial fibrillation: Secondary | ICD-10-CM | POA: Diagnosis not present

## 2022-10-21 DIAGNOSIS — J90 Pleural effusion, not elsewhere classified: Secondary | ICD-10-CM | POA: Diagnosis not present

## 2022-10-21 DIAGNOSIS — R1011 Right upper quadrant pain: Secondary | ICD-10-CM | POA: Diagnosis not present

## 2022-10-21 DIAGNOSIS — E8809 Other disorders of plasma-protein metabolism, not elsewhere classified: Secondary | ICD-10-CM | POA: Diagnosis not present

## 2022-10-21 DIAGNOSIS — E860 Dehydration: Secondary | ICD-10-CM | POA: Diagnosis not present

## 2022-10-21 DIAGNOSIS — N3000 Acute cystitis without hematuria: Secondary | ICD-10-CM | POA: Diagnosis not present

## 2022-10-21 DIAGNOSIS — K449 Diaphragmatic hernia without obstruction or gangrene: Secondary | ICD-10-CM | POA: Diagnosis not present

## 2022-10-21 DIAGNOSIS — E46 Unspecified protein-calorie malnutrition: Secondary | ICD-10-CM | POA: Diagnosis not present

## 2022-10-21 DIAGNOSIS — R197 Diarrhea, unspecified: Secondary | ICD-10-CM | POA: Diagnosis not present

## 2022-10-21 DIAGNOSIS — R079 Chest pain, unspecified: Secondary | ICD-10-CM | POA: Diagnosis not present

## 2022-10-21 DIAGNOSIS — K912 Postsurgical malabsorption, not elsewhere classified: Secondary | ICD-10-CM | POA: Diagnosis not present

## 2022-10-21 DIAGNOSIS — I951 Orthostatic hypotension: Secondary | ICD-10-CM | POA: Diagnosis not present

## 2022-10-21 DIAGNOSIS — I480 Paroxysmal atrial fibrillation: Secondary | ICD-10-CM | POA: Diagnosis not present

## 2022-10-21 DIAGNOSIS — J9811 Atelectasis: Secondary | ICD-10-CM | POA: Diagnosis not present

## 2022-10-23 DIAGNOSIS — I4719 Other supraventricular tachycardia: Secondary | ICD-10-CM | POA: Insufficient documentation

## 2022-11-06 DIAGNOSIS — R195 Other fecal abnormalities: Secondary | ICD-10-CM | POA: Diagnosis not present

## 2022-11-06 DIAGNOSIS — Z79899 Other long term (current) drug therapy: Secondary | ICD-10-CM | POA: Diagnosis not present

## 2022-11-06 DIAGNOSIS — E119 Type 2 diabetes mellitus without complications: Secondary | ICD-10-CM | POA: Diagnosis not present

## 2022-11-06 DIAGNOSIS — E440 Moderate protein-calorie malnutrition: Secondary | ICD-10-CM | POA: Diagnosis not present

## 2022-11-06 DIAGNOSIS — R5383 Other fatigue: Secondary | ICD-10-CM | POA: Diagnosis not present

## 2022-11-06 DIAGNOSIS — Z6841 Body Mass Index (BMI) 40.0 and over, adult: Secondary | ICD-10-CM | POA: Diagnosis not present

## 2022-11-06 DIAGNOSIS — G8929 Other chronic pain: Secondary | ICD-10-CM | POA: Diagnosis not present

## 2022-11-06 DIAGNOSIS — E1165 Type 2 diabetes mellitus with hyperglycemia: Secondary | ICD-10-CM | POA: Diagnosis not present

## 2022-11-06 DIAGNOSIS — K912 Postsurgical malabsorption, not elsewhere classified: Secondary | ICD-10-CM | POA: Diagnosis not present

## 2022-11-06 DIAGNOSIS — J168 Pneumonia due to other specified infectious organisms: Secondary | ICD-10-CM | POA: Diagnosis not present

## 2022-11-06 DIAGNOSIS — K8020 Calculus of gallbladder without cholecystitis without obstruction: Secondary | ICD-10-CM | POA: Diagnosis not present

## 2022-11-06 DIAGNOSIS — K5900 Constipation, unspecified: Secondary | ICD-10-CM | POA: Diagnosis not present

## 2022-11-06 DIAGNOSIS — D62 Acute posthemorrhagic anemia: Secondary | ICD-10-CM | POA: Diagnosis not present

## 2022-11-06 DIAGNOSIS — I509 Heart failure, unspecified: Secondary | ICD-10-CM | POA: Diagnosis not present

## 2022-11-06 DIAGNOSIS — I480 Paroxysmal atrial fibrillation: Secondary | ICD-10-CM | POA: Diagnosis not present

## 2022-11-06 DIAGNOSIS — I890 Lymphedema, not elsewhere classified: Secondary | ICD-10-CM | POA: Diagnosis not present

## 2022-11-06 DIAGNOSIS — I10 Essential (primary) hypertension: Secondary | ICD-10-CM | POA: Diagnosis not present

## 2022-11-06 DIAGNOSIS — I951 Orthostatic hypotension: Secondary | ICD-10-CM | POA: Diagnosis not present

## 2022-11-06 DIAGNOSIS — I469 Cardiac arrest, cause unspecified: Secondary | ICD-10-CM | POA: Diagnosis not present

## 2022-11-06 DIAGNOSIS — R6521 Severe sepsis with septic shock: Secondary | ICD-10-CM | POA: Diagnosis not present

## 2022-11-06 DIAGNOSIS — E871 Hypo-osmolality and hyponatremia: Secondary | ICD-10-CM | POA: Diagnosis present

## 2022-11-06 DIAGNOSIS — K760 Fatty (change of) liver, not elsewhere classified: Secondary | ICD-10-CM | POA: Diagnosis not present

## 2022-11-06 DIAGNOSIS — F419 Anxiety disorder, unspecified: Secondary | ICD-10-CM | POA: Diagnosis not present

## 2022-11-06 DIAGNOSIS — I5032 Chronic diastolic (congestive) heart failure: Secondary | ICD-10-CM | POA: Diagnosis not present

## 2022-11-06 DIAGNOSIS — I959 Hypotension, unspecified: Secondary | ICD-10-CM | POA: Diagnosis not present

## 2022-11-06 DIAGNOSIS — M7989 Other specified soft tissue disorders: Secondary | ICD-10-CM | POA: Diagnosis present

## 2022-11-06 DIAGNOSIS — J189 Pneumonia, unspecified organism: Secondary | ICD-10-CM | POA: Diagnosis not present

## 2022-11-06 DIAGNOSIS — L89152 Pressure ulcer of sacral region, stage 2: Secondary | ICD-10-CM | POA: Diagnosis present

## 2022-11-06 DIAGNOSIS — M810 Age-related osteoporosis without current pathological fracture: Secondary | ICD-10-CM | POA: Diagnosis not present

## 2022-11-06 DIAGNOSIS — Z743 Need for continuous supervision: Secondary | ICD-10-CM | POA: Diagnosis not present

## 2022-11-06 DIAGNOSIS — I5030 Unspecified diastolic (congestive) heart failure: Secondary | ICD-10-CM | POA: Diagnosis not present

## 2022-11-06 DIAGNOSIS — G9349 Other encephalopathy: Secondary | ICD-10-CM | POA: Diagnosis not present

## 2022-11-06 DIAGNOSIS — E559 Vitamin D deficiency, unspecified: Secondary | ICD-10-CM | POA: Diagnosis not present

## 2022-11-06 DIAGNOSIS — J90 Pleural effusion, not elsewhere classified: Secondary | ICD-10-CM | POA: Diagnosis not present

## 2022-11-06 DIAGNOSIS — N179 Acute kidney failure, unspecified: Secondary | ICD-10-CM | POA: Diagnosis present

## 2022-11-06 DIAGNOSIS — R531 Weakness: Secondary | ICD-10-CM | POA: Diagnosis not present

## 2022-11-06 DIAGNOSIS — R600 Localized edema: Secondary | ICD-10-CM | POA: Diagnosis not present

## 2022-11-06 DIAGNOSIS — Y95 Nosocomial condition: Secondary | ICD-10-CM | POA: Diagnosis present

## 2022-11-06 DIAGNOSIS — E43 Unspecified severe protein-calorie malnutrition: Secondary | ICD-10-CM | POA: Diagnosis not present

## 2022-11-06 DIAGNOSIS — I482 Chronic atrial fibrillation, unspecified: Secondary | ICD-10-CM | POA: Diagnosis not present

## 2022-11-06 DIAGNOSIS — R197 Diarrhea, unspecified: Secondary | ICD-10-CM | POA: Diagnosis not present

## 2022-11-06 DIAGNOSIS — R601 Generalized edema: Secondary | ICD-10-CM | POA: Diagnosis not present

## 2022-11-06 DIAGNOSIS — Z1152 Encounter for screening for COVID-19: Secondary | ICD-10-CM | POA: Diagnosis not present

## 2022-11-06 DIAGNOSIS — R609 Edema, unspecified: Secondary | ICD-10-CM | POA: Diagnosis not present

## 2022-11-06 DIAGNOSIS — D638 Anemia in other chronic diseases classified elsewhere: Secondary | ICD-10-CM | POA: Diagnosis present

## 2022-11-06 DIAGNOSIS — F418 Other specified anxiety disorders: Secondary | ICD-10-CM | POA: Diagnosis not present

## 2022-11-06 DIAGNOSIS — F32A Depression, unspecified: Secondary | ICD-10-CM | POA: Diagnosis present

## 2022-11-06 DIAGNOSIS — E1142 Type 2 diabetes mellitus with diabetic polyneuropathy: Secondary | ICD-10-CM | POA: Diagnosis not present

## 2022-11-06 DIAGNOSIS — D509 Iron deficiency anemia, unspecified: Secondary | ICD-10-CM | POA: Diagnosis not present

## 2022-11-06 DIAGNOSIS — A419 Sepsis, unspecified organism: Secondary | ICD-10-CM | POA: Diagnosis not present

## 2022-11-06 DIAGNOSIS — Z66 Do not resuscitate: Secondary | ICD-10-CM | POA: Diagnosis not present

## 2022-11-06 DIAGNOSIS — R579 Shock, unspecified: Secondary | ICD-10-CM | POA: Diagnosis not present

## 2022-11-06 DIAGNOSIS — I4891 Unspecified atrial fibrillation: Secondary | ICD-10-CM | POA: Diagnosis not present

## 2022-11-06 DIAGNOSIS — R770 Abnormality of albumin: Secondary | ICD-10-CM | POA: Diagnosis not present

## 2022-11-06 DIAGNOSIS — Z9884 Bariatric surgery status: Secondary | ICD-10-CM | POA: Diagnosis not present

## 2022-11-06 DIAGNOSIS — I2510 Atherosclerotic heart disease of native coronary artery without angina pectoris: Secondary | ICD-10-CM | POA: Diagnosis not present

## 2022-11-06 DIAGNOSIS — R0602 Shortness of breath: Secondary | ICD-10-CM | POA: Diagnosis not present

## 2022-11-06 DIAGNOSIS — I110 Hypertensive heart disease with heart failure: Secondary | ICD-10-CM | POA: Diagnosis present

## 2022-11-06 DIAGNOSIS — E039 Hypothyroidism, unspecified: Secondary | ICD-10-CM | POA: Diagnosis not present

## 2022-11-06 DIAGNOSIS — R110 Nausea: Secondary | ICD-10-CM | POA: Diagnosis not present

## 2022-11-06 DIAGNOSIS — R2243 Localized swelling, mass and lump, lower limb, bilateral: Secondary | ICD-10-CM | POA: Diagnosis not present

## 2022-11-06 DIAGNOSIS — Z7401 Bed confinement status: Secondary | ICD-10-CM | POA: Diagnosis not present

## 2022-11-06 DIAGNOSIS — J9601 Acute respiratory failure with hypoxia: Secondary | ICD-10-CM | POA: Diagnosis not present

## 2022-11-10 DIAGNOSIS — I951 Orthostatic hypotension: Secondary | ICD-10-CM | POA: Diagnosis not present

## 2022-11-10 DIAGNOSIS — I890 Lymphedema, not elsewhere classified: Secondary | ICD-10-CM | POA: Diagnosis not present

## 2022-11-10 DIAGNOSIS — I5030 Unspecified diastolic (congestive) heart failure: Secondary | ICD-10-CM | POA: Diagnosis not present

## 2022-11-10 DIAGNOSIS — E1165 Type 2 diabetes mellitus with hyperglycemia: Secondary | ICD-10-CM | POA: Diagnosis not present

## 2022-11-13 DIAGNOSIS — R600 Localized edema: Secondary | ICD-10-CM | POA: Diagnosis not present

## 2022-11-13 DIAGNOSIS — M810 Age-related osteoporosis without current pathological fracture: Secondary | ICD-10-CM | POA: Diagnosis not present

## 2022-11-13 DIAGNOSIS — G8929 Other chronic pain: Secondary | ICD-10-CM | POA: Diagnosis not present

## 2022-11-13 DIAGNOSIS — I10 Essential (primary) hypertension: Secondary | ICD-10-CM | POA: Diagnosis not present

## 2022-11-13 DIAGNOSIS — I951 Orthostatic hypotension: Secondary | ICD-10-CM | POA: Diagnosis not present

## 2022-11-13 DIAGNOSIS — F418 Other specified anxiety disorders: Secondary | ICD-10-CM | POA: Diagnosis not present

## 2022-11-13 DIAGNOSIS — R110 Nausea: Secondary | ICD-10-CM | POA: Diagnosis not present

## 2022-11-13 DIAGNOSIS — E039 Hypothyroidism, unspecified: Secondary | ICD-10-CM | POA: Diagnosis not present

## 2022-11-16 DIAGNOSIS — I890 Lymphedema, not elsewhere classified: Secondary | ICD-10-CM | POA: Diagnosis not present

## 2022-11-16 DIAGNOSIS — E559 Vitamin D deficiency, unspecified: Secondary | ICD-10-CM | POA: Diagnosis not present

## 2022-11-16 DIAGNOSIS — E43 Unspecified severe protein-calorie malnutrition: Secondary | ICD-10-CM | POA: Diagnosis not present

## 2022-11-18 ENCOUNTER — Emergency Department (HOSPITAL_COMMUNITY): Payer: Medicare HMO

## 2022-11-18 ENCOUNTER — Encounter (HOSPITAL_COMMUNITY): Payer: Self-pay

## 2022-11-18 ENCOUNTER — Other Ambulatory Visit: Payer: Self-pay

## 2022-11-18 ENCOUNTER — Emergency Department (HOSPITAL_COMMUNITY)
Admission: EM | Admit: 2022-11-18 | Discharge: 2022-11-18 | Disposition: A | Discharge status: Nursing Home (Includes ICF) | Payer: Medicare HMO | Attending: Emergency Medicine | Admitting: Emergency Medicine

## 2022-11-18 DIAGNOSIS — I509 Heart failure, unspecified: Secondary | ICD-10-CM | POA: Diagnosis not present

## 2022-11-18 DIAGNOSIS — Z1152 Encounter for screening for COVID-19: Secondary | ICD-10-CM | POA: Insufficient documentation

## 2022-11-18 DIAGNOSIS — I2510 Atherosclerotic heart disease of native coronary artery without angina pectoris: Secondary | ICD-10-CM | POA: Diagnosis not present

## 2022-11-18 DIAGNOSIS — R600 Localized edema: Secondary | ICD-10-CM | POA: Diagnosis not present

## 2022-11-18 DIAGNOSIS — R609 Edema, unspecified: Secondary | ICD-10-CM | POA: Diagnosis not present

## 2022-11-18 DIAGNOSIS — R0602 Shortness of breath: Secondary | ICD-10-CM | POA: Insufficient documentation

## 2022-11-18 DIAGNOSIS — J90 Pleural effusion, not elsewhere classified: Secondary | ICD-10-CM | POA: Diagnosis not present

## 2022-11-18 DIAGNOSIS — M7989 Other specified soft tissue disorders: Secondary | ICD-10-CM | POA: Diagnosis present

## 2022-11-18 LAB — CBC WITH DIFFERENTIAL/PLATELET
Abs Immature Granulocytes: 0.08 10*3/uL — ABNORMAL HIGH (ref 0.00–0.07)
Basophils Absolute: 0 10*3/uL (ref 0.0–0.1)
Basophils Relative: 0 %
Eosinophils Absolute: 0 10*3/uL (ref 0.0–0.5)
Eosinophils Relative: 0 %
HCT: 36.8 % (ref 36.0–46.0)
Hemoglobin: 11.7 g/dL — ABNORMAL LOW (ref 12.0–15.0)
Immature Granulocytes: 1 %
Lymphocytes Relative: 20 %
Lymphs Abs: 2.5 10*3/uL (ref 0.7–4.0)
MCH: 26.2 pg (ref 26.0–34.0)
MCHC: 31.8 g/dL (ref 30.0–36.0)
MCV: 82.3 fL (ref 80.0–100.0)
Monocytes Absolute: 0.8 10*3/uL (ref 0.1–1.0)
Monocytes Relative: 6 %
Neutro Abs: 8.9 10*3/uL — ABNORMAL HIGH (ref 1.7–7.7)
Neutrophils Relative %: 73 %
Platelets: 525 10*3/uL — ABNORMAL HIGH (ref 150–400)
RBC: 4.47 MIL/uL (ref 3.87–5.11)
RDW: 19.4 % — ABNORMAL HIGH (ref 11.5–15.5)
WBC: 12.3 10*3/uL — ABNORMAL HIGH (ref 4.0–10.5)
nRBC: 0 % (ref 0.0–0.2)

## 2022-11-18 LAB — BASIC METABOLIC PANEL
Anion gap: 10 (ref 5–15)
BUN: 14 mg/dL (ref 8–23)
CO2: 18 mmol/L — ABNORMAL LOW (ref 22–32)
Calcium: 7 mg/dL — ABNORMAL LOW (ref 8.9–10.3)
Chloride: 106 mmol/L (ref 98–111)
Creatinine, Ser: 0.85 mg/dL (ref 0.44–1.00)
GFR, Estimated: >60 mL/min (ref >60)
Glucose, Bld: 207 mg/dL — ABNORMAL HIGH (ref 70–99)
Potassium: 4.3 mmol/L (ref 3.5–5.1)
Sodium: 134 mmol/L — ABNORMAL LOW (ref 135–145)

## 2022-11-18 LAB — RESP PANEL BY RT-PCR (RSV, FLU A&B, COVID)  RVPGX2
Influenza A by PCR: NEGATIVE
Influenza B by PCR: NEGATIVE
Resp Syncytial Virus by PCR: NEGATIVE
SARS Coronavirus 2 by RT PCR: NEGATIVE

## 2022-11-18 LAB — TROPONIN I (HIGH SENSITIVITY)
Troponin I (High Sensitivity): 6 ng/L (ref <18)
Troponin I (High Sensitivity): 6 ng/L (ref <18)

## 2022-11-18 LAB — BRAIN NATRIURETIC PEPTIDE: B Natriuretic Peptide: 93.4 pg/mL (ref 0.0–100.0)

## 2022-11-18 MED ORDER — FUROSEMIDE 40 MG PO TABS: 40.0000 mg | ORAL_TABLET | Freq: Every day | ORAL | 0 refills | Status: AC

## 2022-11-18 MED ORDER — METOPROLOL SUCCINATE ER 25 MG PO TB24: 25.0000 mg | ORAL_TABLET | Freq: Every day | ORAL | Status: DC

## 2022-11-18 MED ORDER — POTASSIUM CHLORIDE ER 10 MEQ PO TBCR: 10.0000 meq | EXTENDED_RELEASE_TABLET | Freq: Every day | ORAL | Status: DC

## 2022-11-18 MED ORDER — CLONAZEPAM 0.5 MG PO TABS
1.0000 mg | ORAL_TABLET | Freq: Every evening | ORAL | Status: DC
  Administered 2022-11-18: 1 mg via ORAL
  Filled 2022-11-18: qty 2

## 2022-11-18 MED ORDER — FUROSEMIDE 10 MG/ML IJ SOLN
40.0000 mg | Freq: Once | INTRAMUSCULAR | Status: AC
  Administered 2022-11-18: 40 mg via INTRAVENOUS
  Filled 2022-11-18: qty 4

## 2022-11-18 NOTE — Discharge Instructions (Addendum)
Increase your diuretic to 40 mg daily for 10 days. You should have repeat labs checked in 3 days by your primary care provider. Follow-up with your primary care provider for further care and recommendations regarding lymphedema and generalized weakness which been present over the last 3 months.

## 2022-11-18 NOTE — ED Provider Notes (Signed)
[place] EM[facility]GENC[facility] DE[facility]MENT AT [facility] Provider Note   CSN: [id number] Arrival date & time: 11/18/22  1709     History Chief Complaint  Patient presents with   Leg Swelling   Shortness of Breath    HPI Shannel Zahm is a 77 y.o. female presenting for chief complaint of dyspnea and bilateral lower extremity swelling.  Patient presenting from nursing home.  Patient has an extensive history including lymphedema, heart failure, atrial fibrillation, CAD.  She states that she has been in a nursing home over the last 90 days in and out of the [facility].  States that she has exquisite volume overload was taken off Lasix in the outpatient setting because of poor response and had not had a chance to follow-up with a specialist recently.  States that she has been building up more and more fluid in the interim and has begun to have dyspnea and inability to complete any kind of ambulation secondary to weakness and near syncope.  States this is how she has felt in the past when she was volume overloaded. Denies fevers or chills, nausea vomiting, syncope or chest pain at this time..   Patient's recorded medical, surgical, social, medication list and allergies were reviewed in the Snapshot window as part of the initial history.   Review of Systems   Review of Systems  Constitutional:  Negative for chills and fever.  HENT:  Negative for ear pain and sore throat.   Eyes:  Negative for pain and visual disturbance.  Respiratory:  Negative for cough and shortness of breath.   Cardiovascular:  Positive for leg swelling. Negative for chest pain and palpitations.  Gastrointestinal:  Negative for abdominal pain and vomiting.  Genitourinary:  Negative for dysuria and hematuria.  Musculoskeletal:  Negative for arthralgias and back pain.  Skin:  Negative for color change and rash.  Neurological:  Positive for weakness. Negative for seizures and syncope.  All other systems  reviewed and are negative.   Physical Exam Updated Vital Signs BP 113/85   Pulse 61   Temp 98.7 F (37.1 C) (Oral)   Resp 17   Ht 5\' 2"  (1.575 m)   Wt 105.7 kg   SpO2 97%   BMI 42.62 kg/m  Physical Exam Vitals and nursing note reviewed.  Constitutional:      General: She is not in acute distress.    Appearance: She is well-developed.  HENT:     Head: Normocephalic and atraumatic.  Eyes:     Conjunctiva/sclera: Conjunctivae normal.  Cardiovascular:     Rate and Rhythm: Normal rate and regular rhythm.     Heart sounds: No murmur heard. Pulmonary:     Effort: Pulmonary effort is normal. No respiratory distress.     Breath sounds: Normal breath sounds.  Abdominal:     General: There is no distension.     Palpations: Abdomen is soft.     Tenderness: There is no abdominal tenderness. There is no right CVA tenderness or left CVA tenderness.  Musculoskeletal:        General: No swelling or tenderness. Normal range of motion.     Cervical back: Neck supple.     Right lower leg: Edema present.     Left lower leg: Edema present.  Skin:    General: Skin is warm and dry.  Neurological:     General: No focal deficit present.     Mental Status: She is alert and oriented to person,  place, and time. Mental status is at baseline.     Cranial Nerves: No cranial nerve deficit.      ED Course/ Medical Decision Making/ A&P    Procedures Procedures   Medications Ordered in ED Medications  clonazePAM (KLONOPIN) tablet 1 mg (1 mg Oral Given 11/18/22 2216)  metoprolol succinate (TOPROL-XL) 24 hr tablet 25 mg (has no administration in time range)  potassium chloride (KLOR-CON) CR tablet 10 mEq (has no administration in time range)  furosemide (LASIX) injection 40 mg (40 mg Intravenous Given 11/18/22 2100)    Medical Decision Making:    Jameca Chumley is a 77 y.o. female who presented to the ED today with bilateral lower extremity swelling detailed above.     Patient's  presentation is complicated by their history of multiple comorbid medical problems.  Patient placed on continuous vitals and telemetry monitoring while in ED which was reviewed periodically.   Complete initial physical exam performed, notably the patient was hemodynamically stable in no acute distress.      Reviewed and confirmed nursing documentation for past medical history, family history, social history.    Initial Assessment:   With the patient's presentation of bilateral lower extremity swelling, most likely diagnosis is recurrent lymphedema. Other diagnoses were considered including (but not limited to) heart failure exacerbation, cellulitis, pulmonary embolism, DVTs. These are considered less likely due to history of present illness and physical exam findings.   This is most consistent with an acute life/limb threatening illness complicated by underlying chronic conditions. In particular, patient's symptoms have been present for an extended amount of time over 90 days and have been stable.  They are not acutely worsening.  Acute DVT or pathology seems grossly less likely.  Will evaluate as below Initial Plan:  Screening labs including CBC and Metabolic panel to evaluate for infectious or metabolic etiology of disease.  Urinalysis with reflex culture ordered to evaluate for UTI or relevant urologic/nephrologic pathology.  CXR to evaluate for structural/infectious intrathoracic pathology.  Troponin/BNP/EKG to evaluate for cardiac pathology. Viral screening to evaluate for alternative etiology of patient's reported dyspnea Objective evaluation as below reviewed with plan for close reassessment  Initial Study Results:   Laboratory  All laboratory results reviewed without evidence of clinically relevant pathology.    EKG EKG was reviewed independently. Rate, rhythm, axis, intervals all examined and without medically relevant abnormality. ST segments without concerns for elevations.     Radiology  All images reviewed independently. Agree with radiology report at this time.   DG Chest Portable 1 View  Result Date: 11/18/2022 CLINICAL DATA:  Shortness of breath EXAM: PORTABLE CHEST 1 VIEW COMPARISON:  AP chest 10/20/2022 FINDINGS: Cardiac silhouette and mediastinal contours are within normal limits. Mild calcification within the aortic arch. New small left and trace right pleural effusions with associated mild left basilar airspace opacity. No pneumothorax. Moderate multilevel degenerative disc changes of the thoracic spine. IMPRESSION: New small left and trace right pleural effusions with associated mild left basilar airspace opacity, likely atelectasis. Electronically Signed   By: Yvonne Kendall M.D.   On: 11/18/2022 18:04     Final Assessment and Plan:   Patient's objective evaluation is grossly reassuring.  She has bilateral lower extremity swelling but is in no acute distress.  No evidence of acute pathology on objective screening.  Will prescribe higher dose of Lasix for the next 5 days as well as a potassium supplement and she needs to have repeat labs done within 48 hours which  was reinforced.  She does not meet criteria for admission.  She said that she does not like the nursing home that she is at, but does feel safe there.  Given these findings, it is important that patient work with her normal outpatient providers for further care and management and treatment of her lymphedema so that she can participate in physical therapy again but otherwise no acute indication for hospital admission at this time given lack of any acute symptoms.       Disposition:  I have considered need for hospitalization, however, considering all of the above, I believe this patient is stable for discharge at this time.  Patient/family educated about specific return precautions for given chief complaint and symptoms.  Patient/family educated about follow-up with PCP.     Patient/family expressed  understanding of return precautions and need for follow-up. Patient spoken to regarding all imaging and laboratory results and appropriate follow up for these results. All education provided in verbal form with additional information in written form. Time was allowed for answering of patient questions. Patient discharged.    Emergency Department Medication Summary:   Medications  clonazePAM (KLONOPIN) tablet 1 mg (1 mg Oral Given 11/18/22 2216)  metoprolol succinate (TOPROL-XL) 24 hr tablet 25 mg (has no administration in time range)  potassium chloride (KLOR-CON) CR tablet 10 mEq (has no administration in time range)  furosemide (LASIX) injection 40 mg (40 mg Intravenous Given 11/18/22 2100)         Clinical Impression:  1. Peripheral edema      Discharge   Final Clinical Impression(s) / ED Diagnoses Final diagnoses:  Peripheral edema    Rx / DC Orders ED Discharge Orders          Ordered    furosemide (LASIX) 40 MG tablet  Daily        11/18/22 2200              Tretha Sciara, MD 11/18/22 2352

## 2022-11-18 NOTE — ED Triage Notes (Addendum)
Pt arrives via EMS from [facility]. Staff at facility called EMS for increased swelling to bilateral lower extremities. Pt reports worsening sob with exertion. Denies CP

## 2022-11-18 NOTE — ED Notes (Signed)
[facility] contacted for D/C transport back to [facility]

## 2022-11-18 NOTE — ED Notes (Addendum)
After wetting herself pt refused to get the pad changed

## 2022-11-19 DIAGNOSIS — I890 Lymphedema, not elsewhere classified: Secondary | ICD-10-CM | POA: Diagnosis not present

## 2022-11-19 DIAGNOSIS — E43 Unspecified severe protein-calorie malnutrition: Secondary | ICD-10-CM | POA: Diagnosis not present

## 2022-11-30 ENCOUNTER — Other Ambulatory Visit: Payer: Self-pay

## 2022-11-30 ENCOUNTER — Encounter (HOSPITAL_COMMUNITY): Payer: Self-pay

## 2022-11-30 ENCOUNTER — Emergency Department (HOSPITAL_COMMUNITY): Payer: Medicare HMO

## 2022-11-30 ENCOUNTER — Inpatient Hospital Stay (HOSPITAL_COMMUNITY)
Admission: EM | Admit: 2022-11-30 | Discharge: 2023-01-11 | DRG: 871 | Disposition: E | Payer: Medicare HMO | Attending: Student | Admitting: Student

## 2022-11-30 DIAGNOSIS — E1142 Type 2 diabetes mellitus with diabetic polyneuropathy: Secondary | ICD-10-CM | POA: Diagnosis not present

## 2022-11-30 DIAGNOSIS — R195 Other fecal abnormalities: Secondary | ICD-10-CM | POA: Diagnosis not present

## 2022-11-30 DIAGNOSIS — I480 Paroxysmal atrial fibrillation: Secondary | ICD-10-CM | POA: Diagnosis present

## 2022-11-30 DIAGNOSIS — D62 Acute posthemorrhagic anemia: Secondary | ICD-10-CM | POA: Diagnosis not present

## 2022-11-30 DIAGNOSIS — Z1152 Encounter for screening for COVID-19: Secondary | ICD-10-CM | POA: Diagnosis not present

## 2022-11-30 DIAGNOSIS — K912 Postsurgical malabsorption, not elsewhere classified: Secondary | ICD-10-CM | POA: Diagnosis present

## 2022-11-30 DIAGNOSIS — I951 Orthostatic hypotension: Secondary | ICD-10-CM | POA: Diagnosis not present

## 2022-11-30 DIAGNOSIS — I959 Hypotension, unspecified: Secondary | ICD-10-CM | POA: Diagnosis present

## 2022-11-30 DIAGNOSIS — E1165 Type 2 diabetes mellitus with hyperglycemia: Secondary | ICD-10-CM | POA: Diagnosis present

## 2022-11-30 DIAGNOSIS — L89152 Pressure ulcer of sacral region, stage 2: Secondary | ICD-10-CM | POA: Diagnosis present

## 2022-11-30 DIAGNOSIS — E871 Hypo-osmolality and hyponatremia: Secondary | ICD-10-CM | POA: Diagnosis present

## 2022-11-30 DIAGNOSIS — Z9884 Bariatric surgery status: Secondary | ICD-10-CM

## 2022-11-30 DIAGNOSIS — G9349 Other encephalopathy: Secondary | ICD-10-CM | POA: Diagnosis not present

## 2022-11-30 DIAGNOSIS — J9601 Acute respiratory failure with hypoxia: Secondary | ICD-10-CM | POA: Diagnosis not present

## 2022-11-30 DIAGNOSIS — Z87440 Personal history of urinary (tract) infections: Secondary | ICD-10-CM

## 2022-11-30 DIAGNOSIS — R600 Localized edema: Secondary | ICD-10-CM

## 2022-11-30 DIAGNOSIS — K8020 Calculus of gallbladder without cholecystitis without obstruction: Secondary | ICD-10-CM | POA: Diagnosis not present

## 2022-11-30 DIAGNOSIS — I469 Cardiac arrest, cause unspecified: Secondary | ICD-10-CM

## 2022-11-30 DIAGNOSIS — Z9049 Acquired absence of other specified parts of digestive tract: Secondary | ICD-10-CM

## 2022-11-30 DIAGNOSIS — R112 Nausea with vomiting, unspecified: Secondary | ICD-10-CM | POA: Diagnosis present

## 2022-11-30 DIAGNOSIS — F32A Depression, unspecified: Secondary | ICD-10-CM | POA: Diagnosis present

## 2022-11-30 DIAGNOSIS — I890 Lymphedema, not elsewhere classified: Secondary | ICD-10-CM | POA: Diagnosis not present

## 2022-11-30 DIAGNOSIS — R601 Generalized edema: Secondary | ICD-10-CM | POA: Diagnosis not present

## 2022-11-30 DIAGNOSIS — J90 Pleural effusion, not elsewhere classified: Secondary | ICD-10-CM | POA: Diagnosis not present

## 2022-11-30 DIAGNOSIS — R609 Edema, unspecified: Principal | ICD-10-CM

## 2022-11-30 DIAGNOSIS — E039 Hypothyroidism, unspecified: Secondary | ICD-10-CM | POA: Diagnosis present

## 2022-11-30 DIAGNOSIS — E861 Hypovolemia: Secondary | ICD-10-CM | POA: Diagnosis present

## 2022-11-30 DIAGNOSIS — A419 Sepsis, unspecified organism: Secondary | ICD-10-CM | POA: Diagnosis not present

## 2022-11-30 DIAGNOSIS — R110 Nausea: Secondary | ICD-10-CM

## 2022-11-30 DIAGNOSIS — R579 Shock, unspecified: Secondary | ICD-10-CM | POA: Diagnosis not present

## 2022-11-30 DIAGNOSIS — R6521 Severe sepsis with septic shock: Secondary | ICD-10-CM | POA: Diagnosis not present

## 2022-11-30 DIAGNOSIS — K529 Noninfective gastroenteritis and colitis, unspecified: Secondary | ICD-10-CM | POA: Diagnosis present

## 2022-11-30 DIAGNOSIS — F419 Anxiety disorder, unspecified: Secondary | ICD-10-CM | POA: Diagnosis not present

## 2022-11-30 DIAGNOSIS — I482 Chronic atrial fibrillation, unspecified: Secondary | ICD-10-CM | POA: Diagnosis not present

## 2022-11-30 DIAGNOSIS — I10 Essential (primary) hypertension: Secondary | ICD-10-CM | POA: Diagnosis not present

## 2022-11-30 DIAGNOSIS — I5032 Chronic diastolic (congestive) heart failure: Secondary | ICD-10-CM | POA: Diagnosis present

## 2022-11-30 DIAGNOSIS — L8990 Pressure ulcer of unspecified site, unspecified stage: Secondary | ICD-10-CM | POA: Insufficient documentation

## 2022-11-30 DIAGNOSIS — J168 Pneumonia due to other specified infectious organisms: Secondary | ICD-10-CM | POA: Diagnosis not present

## 2022-11-30 DIAGNOSIS — Z66 Do not resuscitate: Secondary | ICD-10-CM | POA: Diagnosis not present

## 2022-11-30 DIAGNOSIS — I110 Hypertensive heart disease with heart failure: Secondary | ICD-10-CM | POA: Diagnosis present

## 2022-11-30 DIAGNOSIS — Y95 Nosocomial condition: Secondary | ICD-10-CM | POA: Diagnosis present

## 2022-11-30 DIAGNOSIS — E43 Unspecified severe protein-calorie malnutrition: Secondary | ICD-10-CM | POA: Diagnosis present

## 2022-11-30 DIAGNOSIS — N179 Acute kidney failure, unspecified: Secondary | ICD-10-CM | POA: Diagnosis present

## 2022-11-30 DIAGNOSIS — K760 Fatty (change of) liver, not elsewhere classified: Secondary | ICD-10-CM | POA: Diagnosis not present

## 2022-11-30 DIAGNOSIS — Z6841 Body Mass Index (BMI) 40.0 and over, adult: Secondary | ICD-10-CM

## 2022-11-30 DIAGNOSIS — G4700 Insomnia, unspecified: Secondary | ICD-10-CM | POA: Diagnosis not present

## 2022-11-30 DIAGNOSIS — I4510 Unspecified right bundle-branch block: Secondary | ICD-10-CM | POA: Diagnosis present

## 2022-11-30 DIAGNOSIS — Z7901 Long term (current) use of anticoagulants: Secondary | ICD-10-CM

## 2022-11-30 DIAGNOSIS — E119 Type 2 diabetes mellitus without complications: Secondary | ICD-10-CM

## 2022-11-30 DIAGNOSIS — Z803 Family history of malignant neoplasm of breast: Secondary | ICD-10-CM

## 2022-11-30 DIAGNOSIS — Z8249 Family history of ischemic heart disease and other diseases of the circulatory system: Secondary | ICD-10-CM

## 2022-11-30 DIAGNOSIS — R4589 Other symptoms and signs involving emotional state: Secondary | ICD-10-CM | POA: Diagnosis present

## 2022-11-30 DIAGNOSIS — D638 Anemia in other chronic diseases classified elsewhere: Secondary | ICD-10-CM | POA: Diagnosis present

## 2022-11-30 DIAGNOSIS — E8809 Other disorders of plasma-protein metabolism, not elsewhere classified: Secondary | ICD-10-CM | POA: Diagnosis present

## 2022-11-30 DIAGNOSIS — Z885 Allergy status to narcotic agent status: Secondary | ICD-10-CM

## 2022-11-30 DIAGNOSIS — J189 Pneumonia, unspecified organism: Secondary | ICD-10-CM | POA: Diagnosis not present

## 2022-11-30 DIAGNOSIS — I4891 Unspecified atrial fibrillation: Secondary | ICD-10-CM | POA: Diagnosis not present

## 2022-11-30 DIAGNOSIS — Z7401 Bed confinement status: Secondary | ICD-10-CM

## 2022-11-30 DIAGNOSIS — Z820 Family history of epilepsy and other diseases of the nervous system: Secondary | ICD-10-CM

## 2022-11-30 DIAGNOSIS — M1990 Unspecified osteoarthritis, unspecified site: Secondary | ICD-10-CM | POA: Diagnosis present

## 2022-11-30 DIAGNOSIS — I2510 Atherosclerotic heart disease of native coronary artery without angina pectoris: Secondary | ICD-10-CM | POA: Diagnosis present

## 2022-11-30 DIAGNOSIS — Z7984 Long term (current) use of oral hypoglycemic drugs: Secondary | ICD-10-CM

## 2022-11-30 DIAGNOSIS — Z79890 Hormone replacement therapy (postmenopausal): Secondary | ICD-10-CM

## 2022-11-30 DIAGNOSIS — R262 Difficulty in walking, not elsewhere classified: Secondary | ICD-10-CM | POA: Diagnosis present

## 2022-11-30 DIAGNOSIS — Z79899 Other long term (current) drug therapy: Secondary | ICD-10-CM

## 2022-11-30 DIAGNOSIS — E876 Hypokalemia: Secondary | ICD-10-CM | POA: Diagnosis not present

## 2022-11-30 DIAGNOSIS — D509 Iron deficiency anemia, unspecified: Secondary | ICD-10-CM | POA: Diagnosis not present

## 2022-11-30 LAB — TROPONIN I (HIGH SENSITIVITY)
Troponin I (High Sensitivity): 7 ng/L (ref <18)
Troponin I (High Sensitivity): 7 ng/L (ref <18)

## 2022-11-30 LAB — CBC WITH DIFFERENTIAL/PLATELET
Abs Immature Granulocytes: 0.12 10*3/uL — ABNORMAL HIGH (ref 0.00–0.07)
Basophils Absolute: 0 10*3/uL (ref 0.0–0.1)
Basophils Relative: 0 %
Eosinophils Absolute: 0 10*3/uL (ref 0.0–0.5)
Eosinophils Relative: 0 %
HCT: 34.8 % — ABNORMAL LOW (ref 36.0–46.0)
Hemoglobin: 11.8 g/dL — ABNORMAL LOW (ref 12.0–15.0)
Immature Granulocytes: 1 %
Lymphocytes Relative: 8 %
Lymphs Abs: 1.6 10*3/uL (ref 0.7–4.0)
MCH: 26.3 pg (ref 26.0–34.0)
MCHC: 33.9 g/dL (ref 30.0–36.0)
MCV: 77.5 fL — ABNORMAL LOW (ref 80.0–100.0)
Monocytes Absolute: 0.7 10*3/uL (ref 0.1–1.0)
Monocytes Relative: 3 %
Neutro Abs: 18.8 10*3/uL — ABNORMAL HIGH (ref 1.7–7.7)
Neutrophils Relative %: 88 %
Platelets: 397 10*3/uL (ref 150–400)
RBC: 4.49 MIL/uL (ref 3.87–5.11)
RDW: 20.1 % — ABNORMAL HIGH (ref 11.5–15.5)
WBC: 21.2 10*3/uL — ABNORMAL HIGH (ref 4.0–10.5)
nRBC: 0.1 % (ref 0.0–0.2)

## 2022-11-30 LAB — COMPREHENSIVE METABOLIC PANEL
ALT: 26 U/L (ref 0–44)
AST: 29 U/L (ref 15–41)
Albumin: <1.5 g/dL — ABNORMAL LOW (ref 3.5–5.0)
Alkaline Phosphatase: 120 U/L (ref 38–126)
Anion gap: 10 (ref 5–15)
BUN: 33 mg/dL — ABNORMAL HIGH (ref 8–23)
CO2: 22 mmol/L (ref 22–32)
Calcium: 6.5 mg/dL — ABNORMAL LOW (ref 8.9–10.3)
Chloride: 98 mmol/L (ref 98–111)
Creatinine, Ser: 1.15 mg/dL — ABNORMAL HIGH (ref 0.44–1.00)
GFR, Estimated: 50 mL/min — ABNORMAL LOW (ref >60)
Glucose, Bld: 200 mg/dL — ABNORMAL HIGH (ref 70–99)
Potassium: 4.1 mmol/L (ref 3.5–5.1)
Sodium: 130 mmol/L — ABNORMAL LOW (ref 135–145)
Total Bilirubin: 0.8 mg/dL (ref 0.3–1.2)
Total Protein: 4 g/dL — ABNORMAL LOW (ref 6.5–8.1)

## 2022-11-30 LAB — RESP PANEL BY RT-PCR (RSV, FLU A&B, COVID)  RVPGX2
Influenza A by PCR: NEGATIVE
Influenza B by PCR: NEGATIVE
Resp Syncytial Virus by PCR: NEGATIVE
SARS Coronavirus 2 by RT PCR: NEGATIVE

## 2022-11-30 LAB — PROTIME-INR
INR: 3 — ABNORMAL HIGH (ref 0.8–1.2)
Prothrombin Time: 31 seconds — ABNORMAL HIGH (ref 11.4–15.2)

## 2022-11-30 LAB — LIPASE, BLOOD: Lipase: 28 U/L (ref 11–51)

## 2022-11-30 LAB — CBG MONITORING, ED: Glucose-Capillary: 179 mg/dL — ABNORMAL HIGH (ref 70–99)

## 2022-11-30 LAB — BRAIN NATRIURETIC PEPTIDE: B Natriuretic Peptide: 101.1 pg/mL — ABNORMAL HIGH (ref 0.0–100.0)

## 2022-11-30 LAB — APTT: aPTT: 38 seconds — ABNORMAL HIGH (ref 24–36)

## 2022-11-30 LAB — LACTIC ACID, PLASMA
Lactic Acid, Venous: 3.2 mmol/L (ref 0.5–1.9)
Lactic Acid, Venous: 2.3 mmol/L (ref 0.5–1.9)

## 2022-11-30 MED ORDER — HEPARIN SODIUM (PORCINE) 5000 UNIT/ML IJ SOLN: 5000.0000 [IU] | Freq: Three times a day (TID) | INTRAMUSCULAR | Status: DC

## 2022-11-30 MED ORDER — POLYETHYLENE GLYCOL 3350 17 G PO PACK: 17.0000 g | PACK | Freq: Every day | ORAL | Status: DC | PRN

## 2022-11-30 MED ORDER — METRONIDAZOLE 500 MG/100ML IV SOLN
500.0000 mg | Freq: Once | INTRAVENOUS | Status: AC
  Administered 2022-11-30: 500 mg via INTRAVENOUS
  Filled 2022-11-30: qty 100

## 2022-11-30 MED ORDER — LACTATED RINGERS IV SOLN: INTRAVENOUS | Status: DC

## 2022-11-30 MED ORDER — LACTATED RINGERS IV BOLUS
1000.0000 mL | Freq: Once | INTRAVENOUS | Status: AC
  Administered 2022-11-30: 1000 mL via INTRAVENOUS

## 2022-11-30 MED ORDER — VANCOMYCIN HCL 2000 MG/400ML IV SOLN
2000.0000 mg | Freq: Once | INTRAVENOUS | Status: AC
  Administered 2022-11-30: 2000 mg via INTRAVENOUS
  Filled 2022-11-30: qty 400

## 2022-11-30 MED ORDER — DOCUSATE SODIUM 100 MG PO CAPS: 100.0000 mg | ORAL_CAPSULE | Freq: Two times a day (BID) | ORAL | Status: DC | PRN

## 2022-11-30 MED ORDER — AMIODARONE IV BOLUS ONLY 150 MG/100ML
150.0000 mg | Freq: Once | INTRAVENOUS | Status: AC
  Administered 2022-11-30: 150 mg via INTRAVENOUS
  Filled 2022-11-30: qty 100

## 2022-11-30 MED ORDER — IOHEXOL 300 MG/ML  SOLN
80.0000 mL | Freq: Once | INTRAMUSCULAR | Status: AC | PRN
  Administered 2022-11-30: 80 mL via INTRAVENOUS

## 2022-11-30 MED ORDER — METOCLOPRAMIDE HCL 5 MG/ML IJ SOLN
10.0000 mg | Freq: Once | INTRAMUSCULAR | Status: AC
  Administered 2022-11-30: 10 mg via INTRAVENOUS
  Filled 2022-11-30: qty 2

## 2022-11-30 MED ORDER — VANCOMYCIN HCL IN DEXTROSE 1-5 GM/200ML-% IV SOLN: 1000.0000 mg | Freq: Once | INTRAVENOUS | Status: DC

## 2022-11-30 MED ORDER — CALCIUM GLUCONATE-NACL 1-0.675 GM/50ML-% IV SOLN
1.0000 g | Freq: Once | INTRAVENOUS | Status: AC
  Administered 2022-11-30: 1000 mg via INTRAVENOUS
  Filled 2022-11-30: qty 50

## 2022-11-30 MED ORDER — LACTATED RINGERS IV BOLUS
500.0000 mL | Freq: Once | INTRAVENOUS | Status: AC
  Administered 2022-11-30: 500 mL via INTRAVENOUS

## 2022-11-30 MED ORDER — NOREPINEPHRINE 4 MG/250ML-% IV SOLN
0.0000 ug/min | INTRAVENOUS | Status: DC
  Administered 2022-11-30: 2 ug/min via INTRAVENOUS
  Filled 2022-11-30: qty 250

## 2022-11-30 MED ORDER — SODIUM CHLORIDE 0.9 % IV SOLN
2.0000 g | Freq: Once | INTRAVENOUS | Status: AC
  Administered 2022-11-30: 2 g via INTRAVENOUS
  Filled 2022-11-30: qty 12.5

## 2022-11-30 NOTE — ED Notes (Signed)
Spoke to MD about patient's venous access for blood cultures. Pt currently only has IV set of blood cultures to be sent. Unable to find any other vascular access, even with ultrasound. Attempted to get a second set of blood cultures and unsuccessful with only interstitial fluid as a return. MD notified and aware of this.

## 2022-11-30 NOTE — ED Notes (Signed)
Patient returned to ER from [place]. Unable to get blood return from IV. Patient complaining of pain when flushing IV. Will notify MD and placed order for IV team to come see.

## 2022-11-30 NOTE — Sepsis Progress Note (Signed)
Repeat lactic acid not done yet due to difficulty obtaining blood.

## 2022-11-30 NOTE — H&P (Signed)
NAME:  Nancy Hinton, MRN:  [id number], DOB:  April 03, 1946, LOS: 0 ADMISSION DATE:  11/15/2022, CONSULTATION DATE:  11/28/2022 REFERRING MD:  EDP, CHIEF COMPLAINT:  n/v/leg edema  History of Present Illness:  Nancy Hinton is a 77 y.o. F with PMH significant for CAD, atrial fibrillation on Eliquis, HFpEF, Type 2 DM, gastric bypass who presented to the ED by EMS for nausea and vomiting for one week with inability to tolerate anything by mouth and bilateral leg swelling.   She denied chest pain, significant diarrhea,  or abdominal pain, no URI symptoms, melena or fever.    In the ED, she was tachycardic in Afib RVR with HR in 140's and SBP as low as 83/63.  Labs showed WBC of 21k, Na 130, gluc 200, creatinine 1.15, lactic acid 3.2, INR 3.0, BNP 101, troponin 7.   CXR with small bilateral pleural effusions and possible LLL PNA.  She was given an amiodarone bolus, IVF, Vancomycin, Cefepime and Zosyn were ordered and PCCM consulted for admission.   Pertinent  Medical History   has a past medical history of Arthritis, Diabetes mellitus without complication ([place]), and Hypertension. Gastric bypass   Significant Hospital Events: Including procedures, antibiotic start and stop dates in addition to other pertinent events   2/19 presented with n/v, in afib with RVR and concern for sepsis with hypotension, PCCM consulted  Interim History / Subjective:  As above  Objective   Blood pressure (!) 93/55, pulse 66, temperature 97.6 F (36.4 C), resp. rate 16, height 5' 3"$  (1.6 m), weight 104.3 kg, SpO2 99 %.        Intake/Output Summary (Last 24 hours) at 12/06/2022 2329 Last data filed at 11/14/2022 2322 Gross per 24 hour  Intake 746.26 ml  Output --  Net 746.26 ml   Filed Weights   12/02/2022 1433  Weight: 104.3 kg   General:   HEENT: MM pink/moist Neuro: intact CV: s1s2 norma;, no m/r/g, no clear JVD. PULM:  clear no distress.  GI: soft, bsx4 active  Extremities: warm/dry, generalized  weeping  edema  Skin: no rashes or lesions  Ancillary tests  Na; 130, Creatinine 1.15 Albumin <1.5 BNP: 101 WBC 18.6 Microcytic anemia. 10.9 Assessment & Plan:   Atrial Fibrillation with RVR States took her home medications as directed, but has been vomiting -admit to step-down unit -given amiodarone bolus and gtt -trops flat -hold Eliquis until can tolerate po's, heparin gtt  Nausea and vomiting  Possible sepsis CT abd/pelvis without acute intra-abdominal finding, possible LLL PNA  -lactic improved with 30cc/kg IVF -received broad spectrum abx -follow blood cultures and UA  Progressive generalized edema of unclear etiology - Titrate NE to keep MAP>65 - Albumin assisted diuresis - Repeat echo - has diagnosis of HFpEF - Urine studies for proteinuria - rule out nephrotic syndrome.  - Protein malnutrition following bariatric surgery also possible  Best Practice (right click and "Reselect all SmartList Selections" daily)   Diet/type: Regular consistency (see orders) DVT prophylaxis: systemic heparin GI prophylaxis: N/A Lines: Central line Foley:  N/A Code Status:  full code Last date of multidisciplinary goals of care discussion [patient updated]  CRITICAL CARE Performed by: Kipp Brood  Total critical care time: 35 minutes  Critical care time was exclusive of separately billable procedures and treating other patients.  Critical care was necessary to treat or prevent imminent or life-threatening deterioration.  Critical care was time spent personally by me on the following activities: development of treatment plan with patient  and/or surrogate as well as nursing, discussions with consultants, evaluation of patient's response to treatment, examination of patient, obtaining history from patient or surrogate, ordering and performing treatments and interventions, ordering and review of laboratory studies, ordering and review of radiographic studies, pulse oximetry,  re-evaluation of patient's condition and participation in multidisciplinary rounds.  Kipp Brood, MD [facility] ICU Physician [place]  Pager: [phone] Mobile: [phone] After hours: [phone].

## 2022-11-30 NOTE — ED Triage Notes (Signed)
Pt brought in by EMS due to nausea and vomiting X 1 week. Also reports bilateral leg swelling. Pt given 42m zofran PTA. States her legs have been swollen for months. Pt also concerned and wanting to talk to someone about getting to a new SNF.

## 2022-11-30 NOTE — Progress Notes (Signed)
Patient assessed for IV start with visual and ultrasound.  No suitable sites found.  Vessels very small.  Only vessel of size noted is right brachial but it is 3 cm plus deep.  Bedside nurse Olin Hauser, RN updated.

## 2022-11-30 NOTE — Sepsis Progress Note (Signed)
Code Sepsis protocol being monitored by eLink. 

## 2022-11-30 NOTE — Progress Notes (Signed)
A consult was received from an ED physician for vanc/cefepime per pharmacy dosing.  The patient's profile has been reviewed for ht/wt/allergies/indication/available labs.   A one time order has been placed for vanc 2g and cefepime 2g.  Further antibiotics/pharmacy consults should be ordered by admitting physician if indicated.                       Thank you, Kara Mead 11/20/2022  3:34 PM

## 2022-11-30 NOTE — ED Provider Notes (Signed)
[place] EMERGENCY DEPARTMENT AT [facility] Provider Note   CSN: [id number] Arrival date & time: 12/04/2022  1425     History {Add pertinent medical, surgical, social history, OB history to HPI:1} Chief Complaint  Patient presents with   Nausea   Diarrhea   Leg Swelling    Nancy Hinton is a 77 y.o. female with HTN, IDA< anemia, HFpEF, MAT, T2DM, orthostatic hypotension, h/o gastric bypass, diarrhea, lymphedema, who presents with nausea/diarrhea, leg swelling.  Pt brought in by EMS due to nausea and vomiting X 1 week, worsening this morning. Decreased PO intake and had been vomiting but now is just dry heaves. Has h/o afib and has taken all of her medications this AM, held them down, including eliquis, hasn't missed any doses she is aware of. Denies CP, abdominal pain. Also reports some SOB as well as bilateral leg swelling. Pt given 42m zofran PTA. States she has had orthostatic hypotension and lightheadedness/dizziness for months now but it has worsened over the last few days. States she has been bed bound because she cannot sit up or she will pass out. Also her legs have been swollen for months, she states they are currently at her baseline. Pt also concerned and wanting to talk to someone about getting to a new SNF.  She currently lives home alone and cannot complete any of her own ADLs. [facility] because [facility] get [facility]. Denies flu-like symptoms, cough, falls, urinary symptoms. Endorses intermittent diarrhea without melena/hematochezia. Also endorses 2 UTIs in the previous 2 months.  On arrival, Afib w/ RVR with rates 100-140 bpm, BP 89/73 with MAP 80 mmHg.     Diarrhea      Home Medications Prior to Admission medications   Medication Sig Start Date End Date Taking? Authorizing Provider  ACCU-CHEK GUIDE test strip  05/23/21   [provider]  apixaban (ELIQUIS) 2.5 MG TABS  tablet Take 5 mg by mouth daily at 6 (six) AM.    [provider]  buPROPion (WELLBUTRIN XL) 150 MG 24 hr tablet Take 150 mg by mouth daily. 06/19/21   [provider]  clonazePAM (KLONOPIN) 1 MG tablet TAKE 1 TABLET BY MOUTH EVERY NIGHT AT BEDTIME AS NEEDED FOR ANXIETY 10/02/16   MJaynee Eagles PA-C  FEROSUL 325 (65 Fe) MG tablet Take 1 tablet by mouth daily at 12 noon. 06/08/22   [provider]  furosemide (LASIX) 20 MG tablet Take 1 tablet (20 mg total) by mouth daily. 07/07/21   SBelva Crome MD  furosemide (LASIX) 40 MG tablet Take 1 tablet (40 mg total) by mouth daily for 14 doses. 11/18/22 12/02/22  CTretha Sciara MD  glipiZIDE (GLUCOTROL XL) 10 MG 24 hr tablet Take 10 mg by mouth daily with breakfast.    [provider]  JARDIANCE 10 MG TABS tablet Take 10 mg by mouth daily. 03/14/22   [provider]  levothyroxine (SYNTHROID, LEVOTHROID) 88 MCG tablet TAKE 1 TABLET BY MOUTH EVERY DAY BEFORE BREAKFAST 03/02/17   SShawnee Knapp MD  lisinopril (PRINIVIL,ZESTRIL) 20 MG tablet Take 1 tablet (20 mg total) by mouth daily. 10/03/15   Le, Thao P, DO  metoprolol succinate (TOPROL-XL) 25 MG 24 hr tablet TAKE 1 TABLET EVERY DAY 07/23/22   SBelva Crome MD  Multiple Vitamin (MULITIVITAMIN WITH MINERALS) TABS Take 1 tablet by mouth daily. Reported on 12/20/2015  [provider]  potassium chloride (KLOR-CON) 10 MEQ tablet TAKE 1 TABLET EVERY DAY 07/23/22   Belva Crome, MD  simvastatin (ZOCOR) 10 MG tablet Take 10 mg by mouth daily. 03/06/21   [provider]  tiZANidine (ZANAFLEX) 2 MG tablet Take 1 tablet by mouth daily. 02/06/18   [provider]  topiramate (TOPAMAX) 50 MG tablet Take 60 mg by mouth daily. 03/31/21   [provider]  Vitamin D, Ergocalciferol, (DRISDOL) 50000 units CAPS capsule TAKE 1 CAPSULE BY MOUTH EVERY TUESDAY 04/15/17   Shawnee Knapp, MD      Allergies    Codeine    Review of Systems   Review of  Systems  Gastrointestinal:  Positive for diarrhea.   Review of systems Negative for f/c.  A 10 point review of systems was performed and is negative unless otherwise reported in HPI.  Physical Exam Updated Vital Signs BP (!) 88/66   Pulse (!) 141   Temp 97.8 F (36.6 C) (Oral)   Resp 14   Ht 5' 3"$  (1.6 m)   Wt 104.3 kg   SpO2 98%   BMI 40.74 kg/m  Physical Exam General: Chronically ill-appearing female, pale, lying in bed.  HEENT: PERRLA, EOMI, Sclera anicteric, MMM, trachea midline.  Cardiology: RRR, no murmurs/rubs/gallops. BL radial and DP pulses equal bilaterally.  Resp: Normal respiratory rate and effort. CTAB, no wheezes, rhonchi, crackles.  Abd: Soft, non-tender, non-distended. No rebound tenderness or guarding.  GU: Deferred. MSK: Diffuse pitting and nonpitting edema in all four extremities, worse in BL LEs. No signs of trauma. Extremities without deformity or TTP. No cyanosis or clubbing. Skin: warm, dry. No rashes or lesions. Back: No CVA tenderness Neuro: A&Ox4, CNs II-XII grossly intact. MAEs. Sensation grossly intact.  Psych: Normal mood and affect.   ED Results / Procedures / Treatments   Labs (all labs ordered are listed, but only abnormal results are displayed) Labs Reviewed  CBC WITH DIFFERENTIAL/PLATELET - Abnormal; Notable for the following components:      Result Value   WBC 21.2 (*)    Hemoglobin 11.8 (*)    HCT 34.8 (*)    MCV 77.5 (*)    RDW 20.1 (*)    Neutro Abs 18.8 (*)    Abs Immature Granulocytes 0.12 (*)    All other components within normal limits  RESP PANEL BY RT-PCR (RSV, FLU A&B, COVID)  RVPGX2  CULTURE, BLOOD (ROUTINE X 2)  CULTURE, BLOOD (ROUTINE X 2)  COMPREHENSIVE METABOLIC PANEL  LIPASE, BLOOD  LACTIC ACID, PLASMA  LACTIC ACID, PLASMA  PROTIME-INR  APTT  URINALYSIS, W/ REFLEX TO CULTURE (INFECTION SUSPECTED)  BRAIN NATRIURETIC PEPTIDE  CBG MONITORING, ED    EKG EKG Interpretation  Date/Time:  Monday November 30 2022  14:49:08 EST Ventricular Rate:  136 PR Interval:  106 QRS Duration: 121 QT Interval:  341 QTC Calculation: 513 R Axis:   -43 Text Interpretation: Sinus tachycardia Ventricular premature complex Right bundle branch block Inferior infarct, old Anterolateral infarct, age indeterminate Confirmed by Lacretia Leigh ([id number]) on 11/19/2022 2:53:28 PM  Radiology No results found.  Procedures .Critical Care  Performed by: Audley Hose, MD Authorized by: Audley Hose, MD   Critical care provider statement:    Critical care time (minutes):  45   Critical care was necessary to treat or prevent imminent or life-threatening deterioration of the following conditions:  Sepsis, shock, dehydration and cardiac failure   Critical care was time spent personally by  me on the following activities:  Development of treatment plan with patient or surrogate, discussions with consultants, evaluation of patient's response to treatment, examination of patient, ordering and review of laboratory studies, ordering and review of radiographic studies, ordering and performing treatments and interventions, pulse oximetry, re-evaluation of patient's condition, review of old charts and obtaining history from patient or surrogate .Central Line  Date/Time: 11/17/2022 9:52 PM  Performed by: Audley Hose, MD Authorized by: Audley Hose, MD   Consent:    Consent obtained:  Verbal   Consent given by:  Patient   Risks, benefits, and alternatives were discussed: yes     Risks discussed:  Arterial puncture, incorrect placement, nerve damage, infection and bleeding   Alternatives discussed:  No treatment Universal protocol:    Procedure explained and questions answered to patient or proxy's satisfaction: yes     Relevant documents present and verified: yes     Test results available: yes     Required blood products, implants, devices, and special equipment available: yes     Site/side marked: yes     Immediately prior  to procedure, a time out was called: yes     Patient identity confirmed:  Verbally with patient Pre-procedure details:    Indication(s): central venous access and insufficient peripheral access     Hand hygiene: Hand hygiene performed prior to insertion     Sterile barrier technique: All elements of maximal sterile technique followed     Skin preparation:  Chlorhexidine   Skin preparation agent: Skin preparation agent completely dried prior to procedure   Sedation:    Sedation type:  None Anesthesia:    Anesthesia method:  Local infiltration   Local anesthetic:  Lidocaine 1% w/o epi Procedure details:    Location:  L femoral   Patient position:  Supine   Procedural supplies:  Triple lumen   Landmarks identified: yes     Ultrasound guidance: yes     Ultrasound guidance timing: prior to insertion and real time     Sterile ultrasound techniques: Sterile gel and sterile probe covers were used     Number of attempts:  1   Successful placement: yes   Post-procedure details:    Post-procedure:  Dressing applied and line sutured   Assessment:  Blood return through all ports and free fluid flow   Procedure completion:  Tolerated well, no immediate complications   {Document cardiac monitor, telemetry assessment procedure when appropriate:1}  Medications Ordered in ED Medications  lactated ringers infusion (has no administration in time range)  ceFEPIme (MAXIPIME) 2 g in sodium chloride 0.9 % 100 mL IVPB (has no administration in time range)  metroNIDAZOLE (FLAGYL) IVPB 500 mg (has no administration in time range)  vancomycin (VANCOCIN) IVPB 1000 mg/200 mL premix (has no administration in time range)  metoCLOPramide (REGLAN) injection 10 mg (has no administration in time range)  lactated ringers bolus 1,000 mL (1,000 mLs Intravenous New Bag/Given 11/29/2022 1515)    ED Course/ Medical Decision Making/ A&P                          Medical Decision Making Amount and/or Complexity of Data  Reviewed Labs: ordered. Decision-making details documented in ED Course. Radiology: ordered. Decision-making details documented in ED Course. ECG/medicine tests: ordered.  Risk Prescription drug management. Decision regarding hospitalization.    This patient presents to the ED for concern of nausea/vomiting, tachycardia/hypotension; this involves an extensive number of treatment  options, and is a complaint that carries with it a high risk of complications and morbidity.  I considered the following differential and admission for this acute, potentially life threatening condition.   MDM:    Patient presents with Afib w/ RVR, hypotension in the [id number] systolic. Patient is lightheaded and pale with significantly decreased PO intake and nausea. Differential for patient's shock includes hypovolemic given decreased PO intake and vomiting, now stating only dry heaving as she hasn't been able to take anything by mouth; septic given tachycardia/hypotension and WBC found to be 21. Consider potential sources of infection including urosepsis given report of 2 recent UTIs though she has no urinary symptoms at this time; intra-abdominal given extensive nausea vomiting though no abdominal pain and benign abdominal exam; respiratory given report of mild shortness of breath though no cough or flulike symptoms. Also consider cardiogenic shock given Afib w/ RVR though no CP. Considered cardioverting patient but will start with acute fluid resuscitation and assess response given decreased PO intake and likelihood of intravascular hypovolemia. Discussed possibility of cardioverting with patient who reports understanding. For patient's nausea/vomiting, consider ACS/arrhythmia, gastroparesis, gastroenteritis/colitis. She has no abdominal pain but also consider UTI/cystitis, cholelithiasis, pancreatitis, appendicitis, diverticulitis, bowel obstruction. Will obtain CT abd pelvis for further eval.   Clinical Course as of  11/29/2022 2152  Mon Nov 30, 2022  1526 WBC(!): 21.2 [HN]  1526 NEUT#(!): 18.8 [HN]  1526 Hemoglobin(!): 11.8 [HN]  1615 Blood pressure improving with fluids [HN]  1615 Lactic Acid, Venous(!!): 3.2 [HN]  1615 Sodium(!): 130 [HN]  1616 Potassium: 4.1 [HN]  1616 Creatinine(!): 1.15 BL ~0.9 [HN]  1616 Calcium(!): 6.5 Will replete [HN]  1616 Anion gap: 10 [HN]  1616 Albumin(!): <1.5 [HN]  1616 Lipase: 28 [HN]  1616 DG Chest Port 1 View IMPRESSION: Small bilateral pleural effusions with airspace disease at the left base, probable atelectasis. No significant change since 11/18/2022.   [HN]  [id number] Glucose-Capillary(!): 179 [HN]  1852 CT ABDOMEN PELVIS W CONTRAST 1. No CT evidence for acute intra-abdominal or pelvic abnormality. 2. Moderate bilateral pleural effusions with partial lower lobe consolidations which may be due to atelectasis or pneumonia. Extensive subcutaneous edema consistent with anasarca. 3. Decreased excretion of contrast from the kidneys consistent with decreased renal function 4. Gallstones. 5. 5.4 cm left adnexal cyst.  Recommend follow-up US in 6-12 months.   [HN]  1917 Consulted w/ cardiology who recommended amio bolus and they will see patient tomorrow morning I/p. Given patient's pleural effusions and diffuse anasarca will hold off on additional fluids though patient is also likely septic. Difficulties with IV access d/t anasarca and IV team has been consulted, patient has received cefepime but flagyl and vanc have yet to be administered d/t [place] IV blowing. CXR shows possible PNA, UA also pending, could be another potential source of infection. Consulted to hospitalist for admission. [HN]  1938 D/w hospitalist who stated he would like to wait until amiodarone is given to reassess her HR, feels she could need the ICU and wants me to touch base with intensivist who felt she is appropriate for stepdown. [HN]  2151 [place] IV attempted to be placed by multiple RNs and IV team but  could not find access point. I looked myself and found nothing in either upper extremity. Discussed with patient who was consented for a central line. Central access placed under ultrasound guidance in L groin. Will give amiodarone now. [HN]    Clinical Course User Index [HN] Audley Hose, MD  Labs: I Ordered, and personally interpreted labs.  The pertinent results include:  those listed above  Imaging Studies ordered: I ordered imaging studies including CXR, CT abd/pelvis I independently visualized and interpreted imaging. I agree with the radiologist interpretation  Additional history obtained from chart review.    Cardiac Monitoring: The patient was maintained on a cardiac monitor.  I personally viewed and interpreted the cardiac monitored which showed an underlying rhythm of: Afib w/ RVR rates 100-140s  Reevaluation: After the interventions noted above, I reevaluated the patient and found that they have :{resolved/improved/worsened:23923::"improved"}  Social Determinants of Health: ***  Disposition:  ***  Co morbidities that complicate the patient evaluation  Past Medical History:  Diagnosis Date   Arthritis    Diabetes mellitus without complication ([place])    history of DM prior to gastric bypass, she has not had to be on medicine since 170 lb weightloss.   Hypertension      Medicines Meds ordered this encounter  Medications   lactated ringers bolus 1,000 mL   lactated ringers infusion   ceFEPIme (MAXIPIME) 2 g in sodium chloride 0.9 % 100 mL IVPB    Order Specific Question:   Antibiotic Indication:    Answer:   Other Indication (list below)    Order Specific Question:   Other Indication:    Answer:   Unknown source   metroNIDAZOLE (FLAGYL) IVPB 500 mg    Order Specific Question:   Antibiotic Indication:    Answer:   Other Indication (list below)    Order Specific Question:   Other Indication:    Answer:   Unknown source   vancomycin (VANCOCIN) IVPB 1000  mg/200 mL premix    Order Specific Question:   Indication:    Answer:   Other Indication (list below)    Order Specific Question:   Other Indication:    Answer:   Unknown source   metoCLOPramide (REGLAN) injection 10 mg    I have reviewed the patients home medicines and have made adjustments as needed  Problem List / ED Course: Problem List Items Addressed This Visit   None        {Document critical care time when appropriate:1} {Document review of labs and clinical decision tools ie heart score, Chads2Vasc2 etc:1}  {Document your independent review of radiology images, and any outside records:1} {Document your discussion with family members, caretakers, and with consultants:1} {Document social determinants of health affecting pt's care:1} {Document your decision making why or why not admission, treatments were needed:1}  This note was created using dictation software, which may contain spelling or grammatical errors.

## 2022-12-01 DIAGNOSIS — R601 Generalized edema: Secondary | ICD-10-CM | POA: Diagnosis not present

## 2022-12-01 DIAGNOSIS — I482 Chronic atrial fibrillation, unspecified: Secondary | ICD-10-CM

## 2022-12-01 DIAGNOSIS — J189 Pneumonia, unspecified organism: Secondary | ICD-10-CM

## 2022-12-01 DIAGNOSIS — I959 Hypotension, unspecified: Secondary | ICD-10-CM

## 2022-12-01 DIAGNOSIS — L8990 Pressure ulcer of unspecified site, unspecified stage: Secondary | ICD-10-CM | POA: Insufficient documentation

## 2022-12-01 DIAGNOSIS — I4891 Unspecified atrial fibrillation: Secondary | ICD-10-CM | POA: Diagnosis not present

## 2022-12-01 LAB — FERRITIN: Ferritin: 41 ng/mL (ref 11–307)

## 2022-12-01 LAB — URINALYSIS, W/ REFLEX TO CULTURE (INFECTION SUSPECTED)
Bilirubin Urine: NEGATIVE
Glucose, UA: 150 mg/dL — AB
Hgb urine dipstick: NEGATIVE
Ketones, ur: NEGATIVE mg/dL
Leukocytes,Ua: NEGATIVE
Nitrite: NEGATIVE
Protein, ur: NEGATIVE mg/dL
Specific Gravity, Urine: 1.02 (ref 1.005–1.030)
pH: 5 (ref 5.0–8.0)

## 2022-12-01 LAB — BASIC METABOLIC PANEL
Anion gap: 11 (ref 5–15)
BUN: 30 mg/dL — ABNORMAL HIGH (ref 8–23)
CO2: 20 mmol/L — ABNORMAL LOW (ref 22–32)
Calcium: 6.7 mg/dL — ABNORMAL LOW (ref 8.9–10.3)
Chloride: 96 mmol/L — ABNORMAL LOW (ref 98–111)
Creatinine, Ser: 1.29 mg/dL — ABNORMAL HIGH (ref 0.44–1.00)
GFR, Estimated: 43 mL/min — ABNORMAL LOW (ref >60)
Glucose, Bld: 191 mg/dL — ABNORMAL HIGH (ref 70–99)
Potassium: 3.3 mmol/L — ABNORMAL LOW (ref 3.5–5.1)
Sodium: 127 mmol/L — ABNORMAL LOW (ref 135–145)

## 2022-12-01 LAB — HEMOGLOBIN A1C
Hgb A1c MFr Bld: 5.9 % — ABNORMAL HIGH (ref 4.8–5.6)
Mean Plasma Glucose: 122.63 mg/dL

## 2022-12-01 LAB — PHOSPHORUS: Phosphorus: 4.8 mg/dL — ABNORMAL HIGH (ref 2.5–4.6)

## 2022-12-01 LAB — APTT
aPTT: >200 seconds (ref 24–36)
aPTT: >200 seconds (ref 24–36)

## 2022-12-01 LAB — GLUCOSE, CAPILLARY
Glucose-Capillary: 212 mg/dL — ABNORMAL HIGH (ref 70–99)
Glucose-Capillary: 234 mg/dL — ABNORMAL HIGH (ref 70–99)
Glucose-Capillary: 253 mg/dL — ABNORMAL HIGH (ref 70–99)
Glucose-Capillary: 269 mg/dL — ABNORMAL HIGH (ref 70–99)
Glucose-Capillary: 266 mg/dL — ABNORMAL HIGH (ref 70–99)
Glucose-Capillary: 184 mg/dL — ABNORMAL HIGH (ref 70–99)

## 2022-12-01 LAB — CBC
HCT: 32.1 % — ABNORMAL LOW (ref 36.0–46.0)
HCT: 30.5 % — ABNORMAL LOW (ref 36.0–46.0)
Hemoglobin: 10.9 g/dL — ABNORMAL LOW (ref 12.0–15.0)
Hemoglobin: 10.4 g/dL — ABNORMAL LOW (ref 12.0–15.0)
MCH: 25.8 pg — ABNORMAL LOW (ref 26.0–34.0)
MCH: 26.5 pg (ref 26.0–34.0)
MCHC: 34 g/dL (ref 30.0–36.0)
MCHC: 34.1 g/dL (ref 30.0–36.0)
MCV: 76.1 fL — ABNORMAL LOW (ref 80.0–100.0)
MCV: 77.6 fL — ABNORMAL LOW (ref 80.0–100.0)
Platelets: 388 10*3/uL (ref 150–400)
Platelets: 323 10*3/uL (ref 150–400)
RBC: 4.22 MIL/uL (ref 3.87–5.11)
RBC: 3.93 MIL/uL (ref 3.87–5.11)
RDW: 20.1 % — ABNORMAL HIGH (ref 11.5–15.5)
RDW: 19.9 % — ABNORMAL HIGH (ref 11.5–15.5)
WBC: 18.6 10*3/uL — ABNORMAL HIGH (ref 4.0–10.5)
WBC: 19.6 10*3/uL — ABNORMAL HIGH (ref 4.0–10.5)
nRBC: 0.2 % (ref 0.0–0.2)
nRBC: 0.3 % — ABNORMAL HIGH (ref 0.0–0.2)

## 2022-12-01 LAB — MRSA NEXT GEN BY PCR, NASAL: MRSA by PCR Next Gen: DETECTED — AB

## 2022-12-01 LAB — IRON AND TIBC
Iron: 28 ug/dL (ref 28–170)
Saturation Ratios: 27 % (ref 10.4–31.8)
TIBC: 104 ug/dL — ABNORMAL LOW (ref 250–450)
UIBC: 76 ug/dL

## 2022-12-01 LAB — HEPARIN LEVEL (UNFRACTIONATED): Heparin Unfractionated: >1.1 IU/mL — ABNORMAL HIGH (ref 0.30–0.70)

## 2022-12-01 LAB — CREATININE, SERUM
Creatinine, Ser: 1.26 mg/dL — ABNORMAL HIGH (ref 0.44–1.00)
GFR, Estimated: 45 mL/min — ABNORMAL LOW (ref >60)

## 2022-12-01 LAB — MAGNESIUM: Magnesium: 1.9 mg/dL (ref 1.7–2.4)

## 2022-12-01 LAB — LACTIC ACID, PLASMA: Lactic Acid, Venous: 1.6 mmol/L (ref 0.5–1.9)

## 2022-12-01 MED ORDER — NAPHAZOLINE-PHENIRAMINE 0.025-0.3 % OP SOLN
1.0000 [drp] | Freq: Four times a day (QID) | OPHTHALMIC | Status: DC | PRN
  Administered 2022-12-01: 1 [drp] via OPHTHALMIC
  Filled 2022-12-01: qty 15

## 2022-12-01 MED ORDER — AMIODARONE HCL IN DEXTROSE 360-4.14 MG/200ML-% IV SOLN
30.0000 mg/h | INTRAVENOUS | Status: DC
  Administered 2022-12-01 – 2022-12-03 (×5): 30 mg/h via INTRAVENOUS
  Filled 2022-12-01 (×5): qty 200

## 2022-12-01 MED ORDER — CHLORHEXIDINE GLUCONATE CLOTH 2 % EX PADS: 6.0000 | MEDICATED_PAD | Freq: Every day | CUTANEOUS | Status: DC

## 2022-12-01 MED ORDER — AMIODARONE HCL IN DEXTROSE 360-4.14 MG/200ML-% IV SOLN
60.0000 mg/h | INTRAVENOUS | Status: AC
  Administered 2022-12-01: 60 mg/h via INTRAVENOUS
  Filled 2022-12-01: qty 200

## 2022-12-01 MED ORDER — METOCLOPRAMIDE HCL 10 MG/10ML PO SOLN
10.0000 mg | Freq: Three times a day (TID) | ORAL | Status: DC
  Administered 2022-12-01 – 2022-12-03 (×6): 10 mg via ORAL
  Filled 2022-12-01 (×7): qty 10

## 2022-12-01 MED ORDER — INSULIN ASPART 100 UNIT/ML IJ SOLN
0.0000 [IU] | INTRAMUSCULAR | Status: DC
  Administered 2022-12-01: 3 [IU] via SUBCUTANEOUS
  Administered 2022-12-01: 2 [IU] via SUBCUTANEOUS
  Administered 2022-12-01 (×2): 5 [IU] via SUBCUTANEOUS
  Administered 2022-12-01: 4 [IU] via SUBCUTANEOUS

## 2022-12-01 MED ORDER — ORAL CARE MOUTH RINSE: 15.0000 mL | OROMUCOSAL | Status: DC | PRN

## 2022-12-01 MED ORDER — FUROSEMIDE 10 MG/ML IJ SOLN
40.0000 mg | Freq: Two times a day (BID) | INTRAMUSCULAR | Status: DC
  Administered 2022-12-01: 40 mg via INTRAVENOUS
  Filled 2022-12-01: qty 4

## 2022-12-01 MED ORDER — SODIUM CHLORIDE 0.9 % IV SOLN
500.0000 mg | INTRAVENOUS | Status: AC
  Administered 2022-12-01 – 2022-12-05 (×5): 500 mg via INTRAVENOUS
  Filled 2022-12-01 (×5): qty 5

## 2022-12-01 MED ORDER — ALBUMIN HUMAN 25 % IV SOLN
12.5000 g | Freq: Four times a day (QID) | INTRAVENOUS | Status: DC
  Administered 2022-12-01 – 2022-12-03 (×9): 12.5 g via INTRAVENOUS
  Filled 2022-12-01 (×9): qty 50

## 2022-12-01 MED ORDER — HEPARIN (PORCINE) 25000 UT/250ML-% IV SOLN
900.0000 [IU]/h | INTRAVENOUS | Status: DC
  Administered 2022-12-02: 900 [IU]/h via INTRAVENOUS
  Filled 2022-12-01: qty 250

## 2022-12-01 MED ORDER — SODIUM CHLORIDE 0.9 % IV SOLN: INTRAVENOUS | Status: DC | PRN

## 2022-12-01 MED ORDER — ALBUMIN HUMAN 25 % IV SOLN
12.5000 g | Freq: Once | INTRAVENOUS | Status: AC
  Administered 2022-12-01: 12.5 g via INTRAVENOUS
  Filled 2022-12-01: qty 50

## 2022-12-01 MED ORDER — HEPARIN (PORCINE) 25000 UT/250ML-% IV SOLN
1100.0000 [IU]/h | INTRAVENOUS | Status: DC
  Administered 2022-12-01: 1100 [IU]/h via INTRAVENOUS
  Filled 2022-12-01: qty 250

## 2022-12-01 MED ORDER — NOREPINEPHRINE 16 MG/250ML-% IV SOLN
0.0000 ug/min | INTRAVENOUS | Status: DC
  Administered 2022-12-01: 24 ug/min via INTRAVENOUS
  Administered 2022-12-01: 36 ug/min via INTRAVENOUS
  Administered 2022-12-01: 23 ug/min via INTRAVENOUS
  Administered 2022-12-02: 19 ug/min via INTRAVENOUS
  Administered 2022-12-03: 12 ug/min via INTRAVENOUS
  Administered 2022-12-04: 4 ug/min via INTRAVENOUS
  Filled 2022-12-01 (×6): qty 250

## 2022-12-01 MED ORDER — CHLORHEXIDINE GLUCONATE CLOTH 2 % EX PADS
6.0000 | MEDICATED_PAD | Freq: Every day | CUTANEOUS | Status: DC
  Administered 2022-12-01 – 2022-12-09 (×10): 6 via TOPICAL

## 2022-12-01 MED ORDER — SODIUM CHLORIDE 0.9 % IV SOLN
2.0000 g | INTRAVENOUS | Status: AC
  Administered 2022-12-01 – 2022-12-05 (×5): 2 g via INTRAVENOUS
  Filled 2022-12-01 (×5): qty 20

## 2022-12-01 MED ORDER — ENSURE ENLIVE PO LIQD
237.0000 mL | Freq: Two times a day (BID) | ORAL | Status: DC
  Administered 2022-12-01 – 2022-12-15 (×21): 237 mL via ORAL

## 2022-12-01 MED ORDER — MUPIROCIN 2 % EX OINT
1.0000 | TOPICAL_OINTMENT | Freq: Two times a day (BID) | CUTANEOUS | Status: AC
  Administered 2022-12-01 – 2022-12-05 (×10): 1 via NASAL
  Filled 2022-12-01: qty 22

## 2022-12-01 NOTE — Progress Notes (Signed)
Two RT's attempted ordered arterial line insertion- unsuccessful.  CCM aware.

## 2022-12-01 NOTE — Progress Notes (Signed)
eLink Physician-Brief Progress Note Patient Name: Nancy Hinton DOB: [birth year] MRN: [id number]   Date of Service  12/01/2022  HPI/Events of Note  Continued soft BP but not sure of accuracy,  levo is at 28 with amio at 60    afib 115       140cc UO after lasix Seen using her phone  eICU Interventions  Ordered an additional albumin dose Ordered lactic acid level to assess signs of hypoperfusion May need aline     Intervention Category Intermediate Interventions: Hypotension - evaluation and management  Judd Lien 12/01/2022, 6:44 AM

## 2022-12-01 NOTE — Progress Notes (Addendum)
NAME:  Nancy Hinton, MRN:  [id number], DOB:  [birth year], LOS: 1 ADMISSION DATE:  12/06/2022, CONSULTATION DATE:  12/01/22 REFERRING MD:  EDP, CHIEF COMPLAINT:  n/v/leg edema  History of Present Illness:  Nancy Hinton is a 77 y.o. F with PMH significant for CAD, atrial fibrillation on Eliquis, HFpEF, Type 2 DM, gastric bypass who presented to the ED by EMS for nausea and vomiting for one week with inability to tolerate anything by mouth and bilateral leg swelling.   She denied chest pain, significant diarrhea,  or abdominal pain, no URI symptoms, melena or fever.    In the ED, she was tachycardic in Afib RVR with HR in 140's and SBP as low as 83/63.  Labs showed WBC of 21k, Na 130, gluc 200, creatinine 1.15, lactic acid 3.2, INR 3.0, BNP 101, troponin 7.   CXR with small bilateral pleural effusions and possible LLL PNA.  She was given an amiodarone bolus, IVF, Vancomycin, Cefepime and Zosyn were ordered and PCCM consulted for admission.   Has not been able to ambulate for the last 3 to 4 months Has been evaluated multiple times for atrial fibrillation and progressive decline  Pertinent  Medical History   has a past medical history of Arthritis, Diabetes mellitus without complication ([place]), and Hypertension. Gastric bypass-2014   Significant Hospital Events: Including procedures, antibiotic start and stop dates in addition to other pertinent events   2/19 presented with n/v, in afib with RVR and concern for sepsis with hypotension, PCCM consulted 2/20 remains hypotensive   Interim History / Subjective:  Hypotensive with significant pressor requirement Awake and alert and interactive  Objective   Blood pressure 127/65, pulse 95, temperature 97.6 F (36.4 C), temperature source Oral, resp. rate 18, height 5' 3"$  (1.6 m), weight 106.1 kg, SpO2 98 %.        Intake/Output Summary (Last 24 hours) at 12/01/2022 0840 Last data filed at 12/01/2022 0736 Gross per 24 hour  Intake  2747.92 ml  Output --  Net 2747.92 ml   Filed Weights   11/17/2022 1433 12/01/22 0200  Weight: 104.3 kg 106.1 kg   General: Middle-age lady, does not appear to be in distress HEENT: Moist oral mucosa Neuro: Awake, alert, interactive CV: S1-S2 appreciated with no murmur PULM: Clear breath sounds anteriorly, decreased air movement at the sides GI: Soft, bowel sounds appreciated Extremities: Skin is warm and dry, generalized edema Skin: Some weeping lower extremities  Ancillary tests  Na; 130, Creatinine 1.15 Albumin <1.5 BNP: 101 WBC 18.6 Microcytic anemia. 10.9  Assessment & Plan:   Atrial fibrillation with RVR -On amiodarone -On heparin -Will transition to home Eliquis once able to take in orally  Nausea and vomiting Concern for sepsis -On antibiotics -Fluid resuscitated -Abdominal CT negative for any significant finding, does show bilateral pleural effusions with possible left lower lobe pneumonia as evidenced by consolidation -Add Reglan 10 mg 3 times daily  Possible sepsis Multilobar pneumonia -Follow cultures -Continue empiric antibiotics for pneumonia -Community-acquired pneumonia-Rocephin, azithromycin  Generalized edema Significant third spacing  Protein calorie malnutrition History of bariatric surgery -Add Ensure if tolerated  Iron deficiency anemia heart failure with preserved ejection fraction  Hypoalbuminemia  Type 2 diabetes -Monitor sugars  SSI   Best Practice (right click and "Reselect all SmartList Selections" daily)   Diet/type: Regular consistency (see orders) DVT prophylaxis: systemic heparin GI prophylaxis: N/A Lines: Central line Foley:  N/A Code Status:  full code Last date of multidisciplinary goals of care discussion [  patient updated]  The patient is critically ill with multiple organ systems failure and requires high complexity decision making for assessment and support, frequent evaluation and titration of therapies,  application of advanced monitoring technologies and extensive interpretation of multiple databases. Critical Care Time devoted to patient care services described in this note independent of APP/resident time (if applicable)  is 35 minutes.   Sherrilyn Rist MD [facility] Personal pager: See Amion If unanswered, please page CCM On-call: [phone]

## 2022-12-01 NOTE — Progress Notes (Signed)
Assessed patient at bedside with ultrasound for radial aline placement. Patient's left radial artery by [place] assessment is very small.  Right radial artery larger, likely amenable to [place] access. Patient states she is "has already been stuck twice".  She is hesitant to be stuck again. We got a larger adult cuff and SBP reading 127.  Offered patient aline for BP monitoring. She will consider.      Nancy Gens, MSN, APRN, NP-C, AGACNP-BC [facility] 12/01/2022, 12:27 PM   Please see [email or web address] for pager details.   From 7A-7P if no response, please call [phone] After hours, please call ELink [phone]

## 2022-12-01 NOTE — Progress Notes (Signed)
eLink Physician-Brief Progress Note Patient Name: Nancy Hinton DOB: August 16, 1946 MRN: [id number]   Date of Service  12/01/2022  HPI/Events of Note  34 F hx of CAD, afib on apixaban, HFpEF, DM, gastric bypass, presented with nausea and vomiting for a week. Also with bilateral leg swelling. Afib RVR on presentation with SBP in the 80s. Lactic acid 3.2, WBC 21. CT abdomen without acute findings but with possible LLL pneumonia. Started on vancomycin and cefepime. Also given Lasix and amiodarone  Seen awake not in distress Ongoing heparin and norepinephrine drip  RN asking for foley order due to c-line in femoral with severe low ext edema, skin issues and so painful turning    stage 2 sacral,   MASD  eICU Interventions  Afib RVR on amiodarone and heparin drip as per protocol. Hypotension now requiring pressors. Intravascular depletion? Albumin ordered Foley catheter ordered, bedside rounding team to reassess when foley can be removed     Intervention Category Minor Interventions: Other: Evaluation Type: New Patient Evaluation  Judd Lien 12/01/2022, 2:57 AM

## 2022-12-01 NOTE — Consult Note (Signed)
WOC Nurse Consult Note: Reason for Consult:Weeping areas of dermatitis in the intertriginous areas (subpannicular, inframammary, bilateral inguinal, posterior knee) and skin folds, ruptured serum-filled blisters. Irritant contact dermatitis with partial thickness skin loss at the buttocks, L>R.   ICD-10 CM Codes for Irritant Dermatitis  L24A2 - Due to fecal, urinary or dual incontinence L24A9 - Due to friction or contact with other specified body fluids L30.4  - Erythema intertrigo. Also used for abrasion of the hand, chafing of the skin, dermatitis due to sweating and friction, friction dermatitis, friction eczema, and genital/thigh intertrigo.   Wound type: irritant contact dermatitis, skin loss (partial thickness) Stage 2 PI, venous insufficiency Pressure Injury POA: Yes Measurement:See Nursing Flow Sheet Wound bed:Red, moist Drainage (amount, consistency, odor) serous Periwound: erythematous, edematous (LEs) Dressing procedure/placement/frequency: Patient is on a mattress replacement with low air loss feature as she is in the ICU. I will continue this once she is transferred to the floor. Turning and repositioning is in place, I have added guidance to minimize time in the supine position. A sacral prophylactic foam is to be placed and Prevalon boots used to float heels and redistribution heel pressure.Only DermaTherapy underpads are to be used beneath the patient. Topical care to the open areas will be to cleanse, cover with antimicrobial nonadherent gauze (xeroform), and cover with dry gauze followed by silicone foam dressings. The dressings to the open areas behind the knee may be secured with Kerlix roll gauze/paper tape. Our house antimicrobial moisture wicking textile (InterDry) is to be used in the intertriginous areas of dermatitis. If you agree, consideration of systemic antifungal use is recommended (e.g., diflucan).  [facility] nursing team will not follow, but will remain available to  this patient, the nursing and medical teams.  Please re-consult if needed.  Thank you for inviting [place] to participate in this patient's Plan of Care.  Maudie Flakes, MSN, RN, CNS, [place], Serita Grammes, [employer], [employer] phone:  [phone]

## 2022-12-01 NOTE — Consult Note (Signed)
Cardiology Consultation   Patient ID: Nancy Hinton MRN: [id number]; DOB: 05-Mar-1946  Admit date: 11/12/2022 Date of Consult: 12/01/2022  PCP:  Kathyrn Lass   [facility] Providers Cardiologist:  Sinclair Grooms, MD (Inactive) --> seen by Dr. Johnsie Cancel   Patient Profile:   Nancy Hinton is a 77 y.o. female with a hx of hypertension, DM2, morbid obesity, chronic lymphedema, RBBB, PAF, vasovagal syncope in 2022, severe orthostatic hypotension with recurrent syncope, gastric bypass in 2014 and chronic diarrhea on Imodium who is being seen 12/01/2022 for the evaluation of afib and anasarca at the request of Dr. Lynetta Mare.  History of Present Illness:   Nancy Hinton is a 77 year old female with past medical history of hypertension, DM2, morbid obesity, chronic lymphedema, RBBB, PAF, vasovagal syncope in 2022, severe orthostatic hypotension with recurrent syncope, gastric bypass in 2014 and chronic diarrhea on Imodium.  Patient was initially evaluated for syncope in 2022, heart monitor at the time demonstrated 1% A-fib burden, A-fib lasted 4 hour 38 minutes.  Coronary CTA obtained on 12/01/2021 demonstrated 25 to 49% calcified mid LAD plaque, normal coronary vessels in the artery.  Patient was last seen by Dr. Tamala Julian on 06/23/2022 at which time she was doing well.  She was on 20 mg daily of Lasix.  She presented to [facility] hospital on 07/27/2022 for worsening leg edema.  Echocardiogram obtained on 07/29/2022 showed EF 50 to 55%, no significant valvular issue.  She underwent aggressive IV diuresis, however near the end of hospitalization, she had significant orthostatic hypotension with positive orthostatic vital sign despite compression wrapping for the leg, abdominal binder and a short course of midodrine.  She required fluid resuscitation.  Hospital course was complicated by UTI, rapid A-fib and diarrhea.  Lasix was discontinued prior to discharge as patient had good  urinary output without it.  She was discharged to rehab.  She returned back to [facility] hospital on 08/24/2022 with multiple recurrent syncopal episodes at [facility], patient was seen by cardiology service for attempted to do tilt table study, however was unable to complete due to positive orthostasis.  She had recurrent UTI and a cystitis.  Urine culture grew E. coli sensitive to ceftriaxone.  She was most recently admitted at [facility] on 10/20/2022 with recurrent syncope again.  She has been placed on high-dose midodrine along with Florinef.  It was felt her lymphedema was contributed by very low albumin, she was started on high protein diet and was given IV albumin.  Meanwhile, patient says every time she had high protein diet, she would have diarrhea.  Hospital course was complicated by recurrent brief A-fib.  Patient was eventually discharged to [facility] and has since returned home.  More recently, patient was seen in the ED on 11/18/2022 for peripheral edema.  BNP was 93.4 at the time.  She was prescribed 40 mg daily of Lasix for increased diuresis.  Patient returned back to the hospital on 12/01/2022 due to worsening orthostatic hypotension and feeling of passing out.  She did not completely pass out.  On arrival, patient was in A-fib with RVR with heart rate in the 140s.  Systolic blood pressure low in the 80s.  White blood cell count 21,000.  Lactic acid 3.1, creatinine 1.15.  Serial troponin negative x 2.  Seen chest x-ray obtained on 12/02/2022 showed a small bilateral pleural effusion with airspace disease in the left base concerning for pneumonia.  CT of abdomen and  pelvis obtained on 11/12/2022 showed no intra-abdominal or pelvic abnormality, moderate bilateral pleural effusion with partial lower lobe consolidation, 5.4 cm left adnexal cyst.  Due to peripheral edema, patient is currently on 40 mg twice a day of IV Lasix.  Home Eliquis has been switched to IV heparin.  IV  amiodarone was started in the ED for better rate control.  Home metoprolol is currently held due to hypotension.  Patient was placed on Levophed for hypotension.     Past Medical History:  Diagnosis Date   Arthritis    Diabetes mellitus without complication ([place])    history of DM prior to gastric bypass, she has not had to be on medicine since 170 lb weightloss.   Hypertension     Past Surgical History:  Procedure Laterality Date   COLON RESECTION     GASTRIC BYPASS     TONSILLECTOMY       Home Medications:  Prior to Admission medications   Medication Sig Start Date End Date Taking? Authorizing Provider  acetaminophen (TYLENOL) 325 MG tablet Take 650 mg by mouth every 6 (six) hours as needed for moderate pain or fever.   Yes [provider]  apixaban (ELIQUIS) 5 MG TABS tablet Take 5 mg by mouth 2 (two) times daily.   Yes [provider]  benzonatate (TESSALON) 100 MG capsule Take 100 mg by mouth 3 (three) times daily as needed for cough.   Yes [provider]  calcium carbonate (TUMS EX) 750 MG chewable tablet Chew 1 tablet by mouth 3 (three) times daily.   Yes [provider]  dextromethorphan-guaiFENesin (ROBITUSSIN-DM) 10-100 MG/5ML liquid Take 5 mLs by mouth every 6 (six) hours as needed for cough.   Yes [provider]  docusate sodium (COLACE) 100 MG capsule Take 100 mg by mouth 2 (two) times daily.   Yes [provider]  FEROSUL 325 (65 Fe) MG tablet Take 1 tablet by mouth daily at 6 (six) AM. 06/08/22  Yes [provider]  fludrocortisone (FLORINEF) 0.1 MG tablet Take 0.1 mg by mouth daily.   Yes [provider]  furosemide (LASIX) 40 MG tablet Take 1 tablet (40 mg total) by mouth daily for 14 doses. 11/18/22 12/02/22 Yes Countryman, Cheri Rous, MD  JARDIANCE 10 MG TABS tablet Take 10 mg by mouth daily. 03/14/22  Yes [provider]  levothyroxine (SYNTHROID, LEVOTHROID) 88 MCG tablet TAKE 1 TABLET BY  MOUTH EVERY DAY BEFORE BREAKFAST 03/02/17  Yes Shawnee Knapp, MD  loperamide (IMODIUM A-D) 2 MG tablet Take 2 mg by mouth 4 (four) times daily as needed for diarrhea or loose stools.   Yes [provider]  melatonin 5 MG TABS Take 5 mg by mouth at bedtime.   Yes [provider]  midodrine (PROAMATINE) 10 MG tablet Take 10 mg by mouth 3 (three) times daily.   Yes [provider]  Multiple Vitamin (MULITIVITAMIN WITH MINERALS) TABS Take 1 tablet by mouth daily. Reported on 12/20/2015   Yes [provider]  ondansetron (ZOFRAN-ODT) 4 MG disintegrating tablet Take 4 mg by mouth every 8 (eight) hours as needed for nausea or vomiting.   Yes [provider]  Pollen Extracts (PROSTAT PO) Take 30 mLs by mouth in the morning and at bedtime.   Yes [provider]  potassium chloride (KLOR-CON) 10 MEQ tablet TAKE 1 TABLET EVERY DAY 07/23/22  Yes Belva Crome, MD  Vitamin D, Ergocalciferol, (DRISDOL) 50000 units CAPS capsule TAKE 1 CAPSULE  BY MOUTH EVERY TUESDAY 04/15/17  Yes Shawnee Knapp, MD  ACCU-CHEK GUIDE test strip  05/23/21   [provider]  clonazePAM (KLONOPIN) 1 MG tablet TAKE 1 TABLET BY MOUTH EVERY NIGHT AT BEDTIME AS NEEDED FOR ANXIETY Patient not taking: Reported on 12/01/2022 10/02/16   Jaynee Eagles, PA-C  furosemide (LASIX) 20 MG tablet Take 1 tablet (20 mg total) by mouth daily. Patient not taking: Reported on 12/01/2022 07/07/21   Belva Crome, MD  lisinopril (PRINIVIL,ZESTRIL) 20 MG tablet Take 1 tablet (20 mg total) by mouth daily. Patient not taking: Reported on 12/01/2022 10/03/15   Rikki Spearing P, DO  metoprolol succinate (TOPROL-XL) 25 MG 24 hr tablet TAKE 1 TABLET EVERY DAY Patient not taking: Reported on 12/01/2022 07/23/22   Belva Crome, MD    Inpatient Medications: Scheduled Meds:  Chlorhexidine Gluconate Cloth  6 each Topical Q0600   furosemide  40 mg Intravenous BID   insulin aspart  0-9 Units Subcutaneous Q4H   mupirocin  ointment  1 Application Nasal BID   Continuous Infusions:  albumin human Stopped (12/01/22 0623)   amiodarone 60 mg/hr (12/01/22 0736)   amiodarone 30 mg/hr (12/01/22 0802)   heparin 1,100 Units/hr (12/01/22 0736)   norepinephrine (LEVOPHED) Adult infusion 37 mcg/min (12/01/22 0736)   PRN Meds: docusate sodium, mouth rinse, polyethylene glycol  Allergies:    Allergies  Allergen Reactions   Codeine Nausea Only    Social History:   Social History   Socioeconomic History   Marital status: Divorced    Spouse name: Not on file   Number of children: Not on file   Years of education: Not on file   Highest education level: Not on file  Occupational History   Not on file  Tobacco Use   Smoking status: Never   Smokeless tobacco: Never  Substance and Sexual Activity   Alcohol use: No   Drug use: No   Sexual activity: Never  Other Topics Concern   Not on file  Social History Narrative   Not on file   Social Determinants of Health   Financial Resource Strain: Not on file  Food Insecurity: Not on file  Transportation Needs: Not on file  Physical Activity: Not on file  Stress: Not on file  Social Connections: Not on file  Intimate Partner Violence: Not on file    Family History:    Family History  Problem Relation Age of Onset   Alzheimer's disease Mother    Heart disease Father    Heart attack Maternal Grandmother    Breast cancer Maternal Grandmother      ROS:  Please see the history of present illness.   All other ROS reviewed and negative.     Physical Exam/Data:   Vitals:   12/01/22 0705 12/01/22 0710 12/01/22 0715 12/01/22 0800  BP: 110/64 102/71 (!) 87/55 127/65  Pulse: (!) 46 (!) 54 (!) 53 95  Resp: 14 13 18 18  $ Temp:    97.6 F (36.4 C)  TempSrc:    Oral  SpO2: 98% 100% 100% 98%  Weight:      Height:        Intake/Output Summary (Last 24 hours) at 12/01/2022 0838 Last data filed at 12/01/2022 0736 Gross per 24 hour  Intake 2747.92 ml   Output --  Net 2747.92 ml      12/01/2022    2:00 AM 11/15/2022    2:33 PM 11/18/2022    5:20 PM  Last 3  Weights  Weight (lbs) 233 lb 14.5 oz 230 lb 233 lb  Weight (kg) 106.1 kg 104.327 kg 105.688 kg     Body mass index is 41.43 kg/m.  General:  Well nourished, well developed, in no acute distress HEENT: normal Neck: no JVD Vascular: No carotid bruits; Distal pulses 2+ bilaterally Cardiac: Irregular; no murmur  Lungs:  clear to auscultation bilaterally, no wheezing, rhonchi or rales  Abd: soft, nontender, no hepatomegaly  Ext: no edema Musculoskeletal:  No deformities, BUE and BLE strength normal and equal Skin: warm and dry  Neuro:  CNs 2-12 intact, no focal abnormalities noted Psych:  Normal affect   EKG:  The EKG was personally reviewed and demonstrates: Atrial fibrillation with RVR Telemetry:  Telemetry was personally reviewed and demonstrates: A-fib with RVR, occasional P waves, will repeat EKG this morning  Relevant CV Studies:  Echo 07/29/2022 SUMMARY  Mild left ventricular hypertrophy  Left ventricular systolic function is low normal.  LV ejection fraction = 50-55%.  There is no significant valvular stenosis or regurgitation.  No pulmonary hypertension.  There is no pericardial effusion.  There is no comparison study available.    Laboratory Data:  High Sensitivity Troponin:   Recent Labs  Lab 11/18/22 1745 11/18/22 2100 11/14/2022 1510 11/22/2022 2205  TROPONINIHS 6 6 7 7     $ Chemistry Recent Labs  Lab 12/05/2022 1510 12/01/22 0219  NA 130* 127*  K 4.1 3.3*  CL 98 96*  CO2 22 20*  GLUCOSE 200* 191*  BUN 33* 30*  CREATININE 1.15* 1.29*  1.26*  CALCIUM 6.5* 6.7*  MG  --  1.9  GFRNONAA 50* 43*  45*  ANIONGAP 10 11    Recent Labs  Lab 11/29/2022 1510  PROT 4.0*  ALBUMIN <1.5*  AST 29  ALT 26  ALKPHOS 120  BILITOT 0.8   Lipids No results for input(s): "CHOL", "TRIG", "HDL", "LABVLDL", "LDLCALC", "CHOLHDL" in the last 168 hours.   Hematology Recent Labs  Lab 12/05/2022 1510 12/01/22 0219 12/01/22 0750  WBC 21.2* 18.6* 19.6*  RBC 4.49 4.22 3.93  HGB 11.8* 10.9* 10.4*  HCT 34.8* 32.1* 30.5*  MCV 77.5* 76.1* 77.6*  MCH 26.3 25.8* 26.5  MCHC 33.9 34.0 34.1  RDW 20.1* 20.1* 19.9*  PLT 397 388 323   Thyroid No results for input(s): "TSH", "FREET4" in the last 168 hours.  BNP Recent Labs  Lab 11/23/2022 1510  BNP 101.1*    DDimer No results for input(s): "DDIMER" in the last 168 hours.   Radiology/Studies:  CT ABDOMEN PELVIS W CONTRAST  Result Date: 11/19/2022 CLINICAL DATA:  Nausea vomiting EXAM: CT ABDOMEN AND PELVIS WITH CONTRAST TECHNIQUE: Multidetector CT imaging of the abdomen and pelvis was performed using the standard protocol following bolus administration of intravenous contrast. RADIATION DOSE REDUCTION: This exam was performed according to the departmental dose-optimization program which includes automated exposure control, adjustment of the mA and/or kV according to patient size and/or use of iterative reconstruction technique. CONTRAST:  27m OMNIPAQUE IOHEXOL 300 MG/ML  SOLN COMPARISON:  Chest CT 10/21/2022, ultrasound 08/26/2022 FINDINGS: Lower chest: Lung bases demonstrate moderate bilateral pleural effusions increased compared to prior chest CT. Partial lower lobe consolidations. Aortic atherosclerosis. Coronary vascular calcification. Small pericardial effusion Hepatobiliary: Hepatic steatosis. Mildly prominent gallbladder without surrounding inflammation. Small gallstones. No biliary dilatation Pancreas: Atrophic.  No inflammation Spleen: Normal in size without focal abnormality. Adrenals/Urinary Tract: Adrenal glands are normal. Kidneys show no hydronephrosis. Delayed excretion of contrast consistent with decreased renal function.  Urinary bladder is unremarkable Stomach/Bowel: The stomach is nonenlarged. Status post gastric bypass. No evidence for bowel obstruction. Large volume of stool in the colon  suggesting constipation. No acute bowel wall thickening. Negative appendix. Vascular/Lymphatic: Moderate aortic atherosclerosis. No aneurysm. No suspicious lymph nodes. Reproductive: Uterus unremarkable. Left adnexal cyst measuring 5.4 x 4.3 by 4.5 cm. Other: Negative for pelvic effusion or free air. Extensive subcutaneous edema. Musculoskeletal: Multilevel degenerative changes. No acute osseous abnormality. IMPRESSION: 1. No CT evidence for acute intra-abdominal or pelvic abnormality. 2. Moderate bilateral pleural effusions with partial lower lobe consolidations which may be due to atelectasis or pneumonia. Extensive subcutaneous edema consistent with anasarca. 3. Decreased excretion of contrast from the kidneys consistent with decreased renal function 4. Gallstones. 5. 5.4 cm left adnexal cyst.  Recommend follow-up US in 6-12 months. Aortic Atherosclerosis (ICD10-I70.0). Electronically Signed   By: Donavan Foil M.D.   On: 11/15/2022 18:45   DG Chest Port 1 View  Result Date: 11/15/2022 CLINICAL DATA:  Nausea vomiting leg swelling EXAM: PORTABLE CHEST 1 VIEW COMPARISON:  11/18/2022, CT chest 12/01/2021, 10/21/2022 FINDINGS: Small bilateral pleural effusions. Airspace disease at the left lung base. Stable cardiomediastinal silhouette. No pneumothorax IMPRESSION: Small bilateral pleural effusions with airspace disease at the left base, probable atelectasis. No significant change since 11/18/2022. Electronically Signed   By: Donavan Foil M.D.   On: 12/08/2022 16:01     Assessment and Plan:   Paroxysmal atrial fibrillation with RVR  -Currently in A-fib with RVR with heart rate in 130s, started on IV amiodarone.  Occasional P waves seen on EKG, will obtain repeat EKG this morning  Lymphedema/anasarca: Longstanding history of lymphedema.  Previously admitted at [facility] ER October 2023 and underwent aggressive IV diuresis, however hospital course was complicated by severe orthostatic  hypotension requiring discontinuation of oral diuretic. Albumin less than 1.5.  During most recent hospitalization, patient was started on Florinef.  - d/c home florinef which may help with the orthostatic hypotension but will worsen anasarca.  Patient was also on Jardiance at home, given morbid obesity and recurrent urinary tract infection, will also discontinue.  -CVP is only 3.  Suspect the patient may have significant third spacing of fluid, however is intravascularly dry.  Will discontinue IV Lasix for now.  Continue pressors.  Can give IV fluid if BP continues to drop.  Possible sepsis: CT abdomen and pelvis showed no acute finding, chest x-ray showed possible left lower lobe pneumonia.  Lactic acid 3.2 on arrival. UA is unrevealing.  Blood culture pending.  Currently on broad-spectrum antibiotic.  Orthostatic hypotension: Occurred in October 2023 at [facility] after aggressive IV diuresis for lymphedema.  Has had multiple admissions for recurrent syncope due to possible orthostasis.  Heart echocardiogram in October showed normal EF of 50 to 55%.  Recently seen in the ED on 11/18/2022 for leg edema and was prescribed 40 mg daily of Lasix.  Discontinue IV Lasix.  Continue pressors given hypotension.  severe hypoalbuminemia: Albumin less than 1.5 on arrival.  This has been a recurrent issue that likely precipitated her recurrent syncope and had severe orthostatic hypotension.   Risk Assessment/Risk Scores:         CHA2DS2-VASc Score = 5   This indicates a 7.2% annual risk of stroke. The patient's score is based upon: CHF History: 0 HTN History: 1 Diabetes History: 1 Stroke History: 0 Vascular Disease History: 1 Age Score: 1 Gender Score: 1  For questions or updates, please contact [place] Please consult [email or web address] for contact info under    Hilbert Corrigan, [place]  12/01/2022 8:38 AM

## 2022-12-01 NOTE — Progress Notes (Signed)
ANTICOAGULATION CONSULT NOTE - Initial Consult  Pharmacy Consult for heparin Indication: atrial fibrillation  Allergies  Allergen Reactions   Codeine Nausea Only    Patient Measurements: Height: 5' 3"$  (160 cm) Weight: 104.3 kg (230 lb) IBW/kg (Calculated) : 52.4 Heparin Dosing Weight: 77kg  Vital Signs: Temp: 97.6 F (36.4 C) (02/19 2234) Temp Source: Oral (02/19 1830) BP: 83/61 (02/20 0000) Pulse Rate: 66 (02/19 2320)  Labs: Recent Labs    11/27/2022 1510 11/12/2022 2205  HGB 11.8*  --   HCT 34.8*  --   PLT 397  --   APTT 38*  --   LABPROT 31.0*  --   INR 3.0*  --   CREATININE 1.15*  --   TROPONINIHS 7 7    Estimated Creatinine Clearance: 48.8 mL/min (A) (by C-G formula based on SCr of 1.15 mg/dL (H)).   Medical History: Past Medical History:  Diagnosis Date   Arthritis    Diabetes mellitus without complication ([place])    history of DM prior to gastric bypass, she has not had to be on medicine since 170 lb weightloss.   Hypertension      Assessment:  77 y.o. F with PMH significant for CAD, atrial fibrillation on Eliquis, HFpEF, Type 2 DM, gastric bypass who presented to the ED by EMS for nausea and vomiting for one week with inability to tolerate anything by mouth and bilateral leg swelling. Pharmacy consulted to dose heparin drip.  LD of eliquis 2/19 @ 0900  Hgb 11.8, plts 397, Aptt 38, Scr 1.15  Goal of Therapy:  Heparin level 0.3-0.7 units/ml aPTT 66-102 seconds Monitor platelets by anticoagulation protocol: Yes   Plan:  No bolus with apixaban in system Start heparin drip at 1100 units/hr Aptt in 8 hours Daily CBC  Dolly Rias RPh 12/01/2022, 1:26 AM

## 2022-12-01 NOTE — Progress Notes (Signed)
eLink Physician-Brief Progress Note Patient Name: Nancy Hinton DOB: Jan 24, 1946 MRN: [id number]   Date of Service  12/01/2022  HPI/Events of Note  Received request for CBGs q 4 with SSI Known diabetic Creatnine 1.29  eICU Interventions  Low dose sliding scale insulin protocol q 4 ordered     Intervention Category Intermediate Interventions: Hyperglycemia - evaluation and treatment  [name] [name] 12/01/2022, 6:03 AM

## 2022-12-01 NOTE — Progress Notes (Signed)
ANTICOAGULATION CONSULT NOTE  Pharmacy Consult for heparin Indication: atrial fibrillation  Allergies  Allergen Reactions   Codeine Nausea Only    Patient Measurements: Height: 5' 3"$  (160 cm) Weight: 106.1 kg (233 lb 14.5 oz) IBW/kg (Calculated) : 52.4 Heparin Dosing Weight: 77kg  Vital Signs: Temp: 97.7 F (36.5 C) (02/20 1140) Temp Source: Oral (02/20 1140) BP: 129/66 (02/20 1415) Pulse Rate: 119 (02/20 1415)  Labs: Recent Labs    11/26/2022 1510 12/04/2022 2205 12/01/22 0219 12/01/22 0750 12/01/22 1030 12/01/22 1350  HGB 11.8*  --  10.9* 10.4*  --   --   HCT 34.8*  --  32.1* 30.5*  --   --   PLT 397  --  388 323  --   --   APTT 38*  --   --   --  >200* >200*  LABPROT 31.0*  --   --   --   --   --   INR 3.0*  --   --   --   --   --   HEPARINUNFRC  --   --  >1.10*  --   --   --   CREATININE 1.15*  --  1.29*  1.26*  --   --   --   TROPONINIHS 7 7  --   --   --   --      Estimated Creatinine Clearance: 45 mL/min (A) (by C-G formula based on SCr of 1.26 mg/dL (H)).   Medical History: Past Medical History:  Diagnosis Date   Arthritis    Diabetes mellitus without complication ([place])    history of DM prior to gastric bypass, she has not had to be on medicine since 170 lb weightloss.   Hypertension      Assessment:  77 y.o. F with PMH significant for CAD, atrial fibrillation on Eliquis, HFpEF, Type 2 DM, gastric bypass who presented to the ED by EMS for nausea and vomiting for one week with inability to tolerate anything by mouth and bilateral leg swelling. Pharmacy consulted to dose heparin drip.  LD of eliquis 2/19 @ 0900  Today, 12/01/2022 aPTT > 200, supratherapeutic on heparin 1100 units/hr CBC: Hgb 10.4, Plts WNL No bleeding reported per RN  Goal of Therapy:  Heparin level 0.3-0.7 units/ml aPTT 66-102 seconds Monitor platelets by anticoagulation protocol: Yes   Plan:  Hold heparin drip x 1 hour Decrease heparin drip to 900 units/hr Check aPTT in 8  hours Daily CBC, heparin level  Peggyann Juba, PharmD, BCPS Pharmacy: [phone] 12/01/2022, 2:35 PM

## 2022-12-02 DIAGNOSIS — J189 Pneumonia, unspecified organism: Secondary | ICD-10-CM | POA: Diagnosis not present

## 2022-12-02 DIAGNOSIS — I959 Hypotension, unspecified: Secondary | ICD-10-CM | POA: Diagnosis not present

## 2022-12-02 DIAGNOSIS — R609 Edema, unspecified: Secondary | ICD-10-CM | POA: Diagnosis not present

## 2022-12-02 DIAGNOSIS — I4891 Unspecified atrial fibrillation: Secondary | ICD-10-CM | POA: Diagnosis not present

## 2022-12-02 LAB — CBC
HCT: 24.2 % — ABNORMAL LOW (ref 36.0–46.0)
Hemoglobin: 8.4 g/dL — ABNORMAL LOW (ref 12.0–15.0)
MCH: 26.5 pg (ref 26.0–34.0)
MCHC: 34.7 g/dL (ref 30.0–36.0)
MCV: 76.3 fL — ABNORMAL LOW (ref 80.0–100.0)
Platelets: 272 10*3/uL (ref 150–400)
RBC: 3.17 MIL/uL — ABNORMAL LOW (ref 3.87–5.11)
RDW: 19.8 % — ABNORMAL HIGH (ref 11.5–15.5)
WBC: 14.4 10*3/uL — ABNORMAL HIGH (ref 4.0–10.5)
nRBC: 0 % (ref 0.0–0.2)

## 2022-12-02 LAB — GLUCOSE, CAPILLARY
Glucose-Capillary: 99 mg/dL (ref 70–99)
Glucose-Capillary: 67 mg/dL — ABNORMAL LOW (ref 70–99)
Glucose-Capillary: 244 mg/dL — ABNORMAL HIGH (ref 70–99)
Glucose-Capillary: 207 mg/dL — ABNORMAL HIGH (ref 70–99)
Glucose-Capillary: 236 mg/dL — ABNORMAL HIGH (ref 70–99)
Glucose-Capillary: 148 mg/dL — ABNORMAL HIGH (ref 70–99)

## 2022-12-02 LAB — BASIC METABOLIC PANEL
Anion gap: 8 (ref 5–15)
BUN: 28 mg/dL — ABNORMAL HIGH (ref 8–23)
CO2: 21 mmol/L — ABNORMAL LOW (ref 22–32)
Calcium: 6.4 mg/dL — CL (ref 8.9–10.3)
Chloride: 102 mmol/L (ref 98–111)
Creatinine, Ser: 1.29 mg/dL — ABNORMAL HIGH (ref 0.44–1.00)
GFR, Estimated: 43 mL/min — ABNORMAL LOW (ref >60)
Glucose, Bld: 165 mg/dL — ABNORMAL HIGH (ref 70–99)
Potassium: 2.4 mmol/L — CL (ref 3.5–5.1)
Sodium: 131 mmol/L — ABNORMAL LOW (ref 135–145)

## 2022-12-02 LAB — MICROALBUMIN / CREATININE URINE RATIO
Creatinine, Urine: 39.5 mg/dL
Microalb Creat Ratio: <8 mg/g creat (ref 0–29)
Microalb, Ur: <3 ug/mL — ABNORMAL HIGH

## 2022-12-02 LAB — APTT: aPTT: >200 s (ref 24–36)

## 2022-12-02 LAB — HEPARIN LEVEL (UNFRACTIONATED): Heparin Unfractionated: >1.1 [IU]/mL — ABNORMAL HIGH (ref 0.30–0.70)

## 2022-12-02 MED ORDER — MIDODRINE HCL 5 MG PO TABS
10.0000 mg | ORAL_TABLET | Freq: Three times a day (TID) | ORAL | Status: DC
  Administered 2022-12-02 – 2022-12-03 (×5): 10 mg via ORAL
  Filled 2022-12-02 (×5): qty 2

## 2022-12-02 MED ORDER — INSULIN ASPART 100 UNIT/ML IJ SOLN
0.0000 [IU] | INTRAMUSCULAR | Status: DC
  Administered 2022-12-02: 3 [IU] via SUBCUTANEOUS
  Administered 2022-12-02: 1 [IU] via SUBCUTANEOUS
  Administered 2022-12-02 (×2): 3 [IU] via SUBCUTANEOUS
  Administered 2022-12-03 (×3): 2 [IU] via SUBCUTANEOUS
  Administered 2022-12-03: 1 [IU] via SUBCUTANEOUS
  Administered 2022-12-03: 3 [IU] via SUBCUTANEOUS
  Administered 2022-12-04 (×3): 2 [IU] via SUBCUTANEOUS
  Administered 2022-12-04: 1 [IU] via SUBCUTANEOUS
  Administered 2022-12-04 – 2022-12-06 (×5): 2 [IU] via SUBCUTANEOUS
  Administered 2022-12-06: 1 [IU] via SUBCUTANEOUS
  Administered 2022-12-06: 7 [IU] via SUBCUTANEOUS
  Administered 2022-12-07: 2 [IU] via SUBCUTANEOUS
  Administered 2022-12-07 – 2022-12-08 (×5): 1 [IU] via SUBCUTANEOUS
  Administered 2022-12-08 – 2022-12-09 (×2): 2 [IU] via SUBCUTANEOUS
  Administered 2022-12-09: 1 [IU] via SUBCUTANEOUS
  Administered 2022-12-09: 2 [IU] via SUBCUTANEOUS
  Administered 2022-12-09 (×2): 1 [IU] via SUBCUTANEOUS
  Administered 2022-12-10: 2 [IU] via SUBCUTANEOUS
  Administered 2022-12-10: 1 [IU] via SUBCUTANEOUS
  Administered 2022-12-10 – 2022-12-11 (×2): 2 [IU] via SUBCUTANEOUS
  Administered 2022-12-11: 1 [IU] via SUBCUTANEOUS
  Administered 2022-12-11: 3 [IU] via SUBCUTANEOUS
  Administered 2022-12-11 – 2022-12-12 (×2): 2 [IU] via SUBCUTANEOUS
  Administered 2022-12-12 (×2): 1 [IU] via SUBCUTANEOUS
  Administered 2022-12-12: 5 [IU] via SUBCUTANEOUS
  Administered 2022-12-12 – 2022-12-13 (×3): 2 [IU] via SUBCUTANEOUS
  Administered 2022-12-13 – 2022-12-14 (×2): 1 [IU] via SUBCUTANEOUS
  Administered 2022-12-14: 2 [IU] via SUBCUTANEOUS
  Administered 2022-12-14: 1 [IU] via SUBCUTANEOUS
  Administered 2022-12-15: 2 [IU] via SUBCUTANEOUS
  Administered 2022-12-15: 1 [IU] via SUBCUTANEOUS

## 2022-12-02 MED ORDER — LEVOTHYROXINE SODIUM 88 MCG PO TABS
88.0000 ug | ORAL_TABLET | Freq: Every day | ORAL | Status: DC
  Administered 2022-12-02 – 2022-12-15 (×14): 88 ug via ORAL
  Filled 2022-12-02 (×16): qty 1

## 2022-12-02 MED ORDER — APIXABAN 5 MG PO TABS
5.0000 mg | ORAL_TABLET | Freq: Two times a day (BID) | ORAL | Status: DC
  Administered 2022-12-02 – 2022-12-15 (×27): 5 mg via ORAL
  Filled 2022-12-02 (×27): qty 1

## 2022-12-02 MED ORDER — HEPARIN (PORCINE) 25000 UT/250ML-% IV SOLN
700.0000 [IU]/h | INTRAVENOUS | Status: DC
  Administered 2022-12-02: 700 [IU]/h via INTRAVENOUS

## 2022-12-02 MED ORDER — HYDROCORTISONE SOD SUC (PF) 100 MG IJ SOLR
50.0000 mg | Freq: Four times a day (QID) | INTRAMUSCULAR | Status: DC
  Administered 2022-12-02 – 2022-12-09 (×29): 50 mg via INTRAVENOUS
  Filled 2022-12-02 (×30): qty 2

## 2022-12-02 MED ORDER — MELATONIN 3 MG PO TABS
3.0000 mg | ORAL_TABLET | Freq: Every evening | ORAL | Status: AC | PRN
  Administered 2022-12-02: 3 mg via ORAL
  Filled 2022-12-02: qty 1

## 2022-12-02 MED ORDER — FERROUS SULFATE 325 (65 FE) MG PO TABS
325.0000 mg | ORAL_TABLET | Freq: Every day | ORAL | Status: DC
  Administered 2022-12-03 – 2022-12-15 (×12): 325 mg via ORAL
  Filled 2022-12-02 (×12): qty 1

## 2022-12-02 MED ORDER — POTASSIUM CHLORIDE CRYS ER 20 MEQ PO TBCR
40.0000 meq | EXTENDED_RELEASE_TABLET | ORAL | Status: AC
  Administered 2022-12-02 (×3): 40 meq via ORAL
  Filled 2022-12-02 (×3): qty 2

## 2022-12-02 NOTE — Progress Notes (Signed)
ANTICOAGULATION CONSULT NOTE  Pharmacy Consult for heparin Indication: atrial fibrillation  Allergies  Allergen Reactions   Codeine Nausea Only    Patient Measurements: Height: 5' 3"$  (160 cm) Weight: 106.1 kg (233 lb 14.5 oz) IBW/kg (Calculated) : 52.4 Heparin Dosing Weight: 77kg  Vital Signs: Temp: 98 F (36.7 C) (02/21 0015) Temp Source: Oral (02/21 0015) BP: 112/54 (02/21 0145) Pulse Rate: 79 (02/21 0145)  Labs: Recent Labs    11/18/2022 1510 11/14/2022 2205 12/01/22 0219 12/01/22 0750 12/01/22 1030 12/01/22 1350 12/02/22 0141  HGB 11.8*  --  10.9* 10.4*  --   --   --   HCT 34.8*  --  32.1* 30.5*  --   --   --   PLT 397  --  388 323  --   --   --   APTT 38*  --   --   --  >200* >200* >200*  LABPROT 31.0*  --   --   --   --   --   --   INR 3.0*  --   --   --   --   --   --   HEPARINUNFRC  --   --  >1.10*  --   --   --   --   CREATININE 1.15*  --  1.29*  1.26*  --   --   --   --   TROPONINIHS 7 7  --   --   --   --   --      Estimated Creatinine Clearance: 45 mL/min (A) (by C-G formula based on SCr of 1.26 mg/dL (H)).   Medical History: Past Medical History:  Diagnosis Date   Arthritis    Diabetes mellitus without complication ([place])    history of DM prior to gastric bypass, she has not had to be on medicine since 170 lb weightloss.   Hypertension      Assessment:  77 y.o. F with PMH significant for CAD, atrial fibrillation on Eliquis, HFpEF, Type 2 DM, gastric bypass who presented to the ED by EMS for nausea and vomiting for one week with inability to tolerate anything by mouth and bilateral leg swelling. Pharmacy consulted to dose heparin drip.  LD of eliquis 2/19 @ 0900  Today, 12/02/2022 aPTT > 200, supratherapeutic on heparin 900 units/hr No bleeding reported per RN  Goal of Therapy:  Heparin level 0.3-0.7 units/ml aPTT 66-102 seconds Monitor platelets by anticoagulation protocol: Yes   Plan:  Hold heparin drip x 1 hour Decrease heparin drip to  700 units/hr Check aPTT in 8 hours Daily CBC, heparin level  Dolly Rias RPh 12/02/2022, 3:23 AM

## 2022-12-02 NOTE — Progress Notes (Signed)
[facility] Progress Note Patient Name: Nancy Hinton DOB: December 04, 1945 MRN: [id number]   Date of Service  12/02/2022  HPI/Events of Note  Patient requesting a sleep aid.  eICU Interventions  PRN Melatonin ordered.        Frederik Pear 12/02/2022, 11:29 PM

## 2022-12-02 NOTE — Progress Notes (Signed)
NAME:  Symba Ramsdell, MRN:  [id number], DOB:  1946/03/07, LOS: 1 ADMISSION DATE:  12/08/2022, CONSULTATION DATE:  12/01/22 REFERRING MD:  EDP, CHIEF COMPLAINT:  n/v/leg edema  History of Present Illness:  Nancy Hinton is a 77 y.o. F with PMH significant for CAD, atrial fibrillation on Eliquis, HFpEF, Type 2 DM, gastric bypass who presented to the ED by EMS for nausea and vomiting for one week with inability to tolerate anything by mouth and bilateral leg swelling.   She denied chest pain, significant diarrhea,  or abdominal pain, no URI symptoms, melena or fever.    In the ED, she was tachycardic in Afib RVR with HR in 140's and SBP as low as 83/63.  Labs showed WBC of 21k, Na 130, gluc 200, creatinine 1.15, lactic acid 3.2, INR 3.0, BNP 101, troponin 7.   CXR with small bilateral pleural effusions and possible LLL PNA.  She was given an amiodarone bolus, IVF, Vancomycin, Cefepime and Zosyn were ordered and PCCM consulted for admission.   Has not been able to ambulate for the last 3 to 4 months Has been evaluated multiple times for atrial fibrillation and progressive decline  Pertinent  Medical History   has a past medical history of Arthritis, Diabetes mellitus without complication ([place]), and Hypertension. Gastric bypass-2014   Significant Hospital Events: Including procedures, antibiotic start and stop dates in addition to other pertinent events   2/19 presented with n/v, in afib with RVR and concern for sepsis with hypotension, PCCM consulted 2/20 remains hypotensive, seen by cards. 2/21 stress dose steroids resumed given long term steroid supplementation, added back midodrine for chronic hypotension and known history of severe orthostasis.  Added back Synthroid.  Changing IV heparin to DOAC   Interim History / Subjective:  No distress.  Tolerating diet.  Objective   Blood pressure 127/65, pulse 95, temperature 97.6 F (36.4 C), temperature source Oral, resp. rate 18, height  5' 3"$  (1.6 m), weight 106.1 kg, SpO2 98 %.        Intake/Output Summary (Last 24 hours) at 12/01/2022 0840 Last data filed at 12/01/2022 0736 Gross per 24 hour  Intake 2747.92 ml  Output --  Net 2747.92 ml   Filed Weights   11/29/2022 1433 12/01/22 0200  Weight: 104.3 kg 106.1 kg   General chronically ill-appearing 77 year old female lying in bed no acute distress HEENT normocephalic atraumatic no jugular venous distention appreciated Pulmonary: Diminished bases no accessory use currently on room air Cardiac: Regular rate and rhythm no MRG Abdomen obese soft nontender tolerating diet Extremities: Chronic lower extremity lymphedema pulses palpable, tender to palp Neuro awake oriented no focal deficits GU clear yellow    Resolved problem list:  Lactic acidosis   Assessment & Plan:   Atrial fibrillation with RVR Plan Cont amiodarone  Can transition to [place] today (after decide on thora) Cont tele   H/o HFpEF Plan Holding antihypertensives 2/2 hypotension Hold Lasix for now  Acute on Chronic  hypotension w/ known h/o orthostatic hypotension (was on florinef and midodrine pta) Plan Will resume Midodrine and add hydrocortisone in place of  florinef (would not abruptly stop the steroids since she's been on them since January) Cont to wean NE for SBP > 100  Possible sepsis w/ LLL PNA & bilateral pleural effusions All cultures neg to date. Was MRSA PCR +.  Plan Abx day 3, currently on azith (day 2 and day 2 of CTX) Await cultures I do not think thora indicated. Effusions same  process as her anasarca   Fluid and Electrolyte imbalance: hyponatremia (worse s/p volume resuscitation), hypokalemia  Plan Repeat chem this am   AKI Plan  Repeat chem this am  SBP goal > 100  Nausea and vomiting -Abdominal CT negative for any significant finding, Plan Cont metaclopramide  Generalized anasarca w/ severe Protein calorie malnutrition History of bariatric surgery Plan Added  Ensure if tolerated Cont albumin Eventually lasix   Iron deficiency anemia Plan Resume Ferosul   Type 2 diabetes w/ hyperglycemia  Plan Ssi   Hypothyroidism  Plan Ck Tsh Resume her synthroid   Best Practice (right click and "Reselect all SmartList Selections" daily)   Diet/type: Regular consistency (see orders) DVT prophylaxis: systemic heparin GI prophylaxis: N/A Lines: Central line Foley:  N/A Code Status:  full code Last date of multidisciplinary goals of care discussion [patient updated]   My cct 43 min   Nancy Hinton ACNP-BC [place] Pager # [phone] OR # [phone] if no answer

## 2022-12-02 NOTE — Progress Notes (Signed)
ANTICOAGULATION CONSULT NOTE  Pharmacy Consult for heparin >> eliquis Indication: atrial fibrillation  Allergies  Allergen Reactions   Codeine Nausea Only    Patient Measurements: Height: 5' 3"$  (160 cm) Weight: 109.9 kg (242 lb 4.6 oz) IBW/kg (Calculated) : 52.4 Heparin Dosing Weight: 77kg  Vital Signs: Temp: 98.5 F (36.9 C) (02/21 0800) Temp Source: Axillary (02/21 0800) BP: 126/51 (02/21 0845) Pulse Rate: 110 (02/21 0845)  Labs: Recent Labs    12/07/2022 1510 11/28/2022 2205 12/01/22 0219 12/01/22 0750 12/01/22 1030 12/01/22 1350 12/02/22 0141 12/02/22 0430  HGB 11.8*  --  10.9* 10.4*  --   --   --  8.4*  HCT 34.8*  --  32.1* 30.5*  --   --   --  24.2*  PLT 397  --  388 323  --   --   --  272  APTT 38*  --   --   --  >200* >200* >200*  --   LABPROT 31.0*  --   --   --   --   --   --   --   INR 3.0*  --   --   --   --   --   --   --   HEPARINUNFRC  --   --  >1.10*  --   --   --   --  >1.10*  CREATININE 1.15*  --  1.29*  1.26*  --   --   --   --   --   TROPONINIHS 7 7  --   --   --   --   --   --      Estimated Creatinine Clearance: 45.9 mL/min (A) (by C-G formula based on SCr of 1.26 mg/dL (H)).   Medical History: Past Medical History:  Diagnosis Date   Arthritis    Diabetes mellitus without complication ([place])    history of DM prior to gastric bypass, she has not had to be on medicine since 170 lb weightloss.   Hypertension      Assessment:  77 y.o. F with PMH significant for CAD, atrial fibrillation on Eliquis, HFpEF, Type 2 DM, gastric bypass who presented to the ED by EMS for nausea and vomiting for one week with inability to tolerate anything by mouth and bilateral leg swelling. Pharmacy consulted to dose heparin drip.  LD of eliquis 2/19 @ 0900  Today, 12/02/2022 aPTT > 200, supratherapeutic on heparin 900 units/hr Hgb down 8.4, PLts WNL No bleeding reported per RN  Transition back to home Eliquis 41m BID  Goal of Therapy:  Heparin level  0.3-0.7 units/ml aPTT 66-102 seconds Monitor platelets by anticoagulation protocol: Yes   Plan:  Stop IV heparin Resume Eliquis 563mBID  ErPeggyann JubaPharmD, BCPS Pharmacy: [phone]/21/2024, 8:57 AM

## 2022-12-02 NOTE — TOC Initial Note (Deleted)
Transition of Care [facility]) - Initial/Assessment Note    Patient Details  Name: Rosyln Rapoza MRN: [id number] Date of Birth: Sep 30, 1946  [facility] of [facility]) CM/SW Contact:    Ninfa Meeker, RN Phone Number: 12/02/2022, 2:48 PM  Clinical Narrative:                  Transition of Care Screening Note:  Transition of Care [facility]) Department has reviewed patient and no TOC needs have been identified at this time. We will continue to monitor patient advancement through Interdisciplinary progressions and if new patient needs arise, please place a consult.         Patient Goals and CMS Choice            Expected Discharge Plan and Services                                              Prior Living Arrangements/Services                       Activities of Daily Living Home Assistive Devices/Equipment: Other (Comment) (from snf) ADL Screening (condition at time of admission) Patient's cognitive ability adequate to safely complete daily activities?: Yes Is the patient deaf or have difficulty hearing?: No Does the patient have difficulty seeing, even when wearing glasses/contacts?: No Does the patient have difficulty concentrating, remembering, or making decisions?: No Patient able to express need for assistance with ADLs?: Yes Does the patient have difficulty dressing or bathing?: Yes Independently performs ADLs?: No Does the patient have difficulty walking or climbing stairs?: Yes Weakness of Legs: Both Weakness of Arms/Hands: Both  Permission Sought/Granted                  Emotional Assessment              Admission diagnosis:  Atrial fibrillation ([place]) [I48.91] Nausea [R11.0] Peripheral edema [R60.9] Atrial fibrillation with RVR ([place]) [I48.91] Hypotension, unspecified hypotension type [I95.9] Pneumonia of both lower lobes due to infectious organism [J18.9] Patient Active Problem List   Diagnosis Date Noted   Anasarca  12/01/2022   Pressure injury of skin 12/01/2022   Multifocal atrial tachycardia 10/23/2022   Diarrhea 10/20/2022   Hypoalbuminemia 10/20/2022   Atrial fibrillation ([place]) 08/14/2022   Heart failure with preserved ejection fraction ([place]) 08/14/2022   Orthostatic hypotension 08/14/2022   Lymphedema 07/27/2022   Type 2 diabetes mellitus without complication, without long-term current use of insulin ([place]) 12/16/2018   Iron deficiency anemia secondary to inadequate dietary iron intake 02/04/2018   Osteoporosis 12/20/2015   Vitamin D deficiency 10/08/2015   Thyroid activity decreased 10/08/2015   Arthritis 10/08/2015   H/O gastric bypass 10/08/2015   Anemia, iron deficiency 12/21/2014   Essential hypertension 12/21/2014   PCP:  Ramiro Harvest, PA-C Pharmacy:   [facility] [phone] [address], [address] AT [facility] OF [address] [place] [address] Phone: [phone] Fax: [phone]  [facility] [place], [place] [address] Phone: [phone] Fax: [phone]     Social Determinants of Health (SDOH) Social History: SDOH Screenings   Food Insecurity: No Food Insecurity (12/01/2022)  Housing: Low Risk  (12/01/2022)  Transportation Needs: No Transportation Needs (12/01/2022)  Utilities: Not  At Risk (12/01/2022)  Tobacco Use: Low Risk  (12/09/2022)   SDOH Interventions:     Readmission Risk Interventions     No data to display

## 2022-12-03 DIAGNOSIS — I959 Hypotension, unspecified: Secondary | ICD-10-CM | POA: Diagnosis not present

## 2022-12-03 DIAGNOSIS — R601 Generalized edema: Secondary | ICD-10-CM | POA: Diagnosis not present

## 2022-12-03 DIAGNOSIS — I4891 Unspecified atrial fibrillation: Secondary | ICD-10-CM | POA: Diagnosis not present

## 2022-12-03 DIAGNOSIS — J189 Pneumonia, unspecified organism: Secondary | ICD-10-CM | POA: Diagnosis not present

## 2022-12-03 LAB — GLUCOSE, CAPILLARY
Glucose-Capillary: 120 mg/dL — ABNORMAL HIGH (ref 70–99)
Glucose-Capillary: 140 mg/dL — ABNORMAL HIGH (ref 70–99)
Glucose-Capillary: 163 mg/dL — ABNORMAL HIGH (ref 70–99)
Glucose-Capillary: 165 mg/dL — ABNORMAL HIGH (ref 70–99)
Glucose-Capillary: 187 mg/dL — ABNORMAL HIGH (ref 70–99)
Glucose-Capillary: 218 mg/dL — ABNORMAL HIGH (ref 70–99)

## 2022-12-03 LAB — CBC
HCT: 21.7 % — ABNORMAL LOW (ref 36.0–46.0)
Hemoglobin: 7.6 g/dL — ABNORMAL LOW (ref 12.0–15.0)
MCH: 26.3 pg (ref 26.0–34.0)
MCHC: 35 g/dL (ref 30.0–36.0)
MCV: 75.1 fL — ABNORMAL LOW (ref 80.0–100.0)
Platelets: 227 10*3/uL (ref 150–400)
RBC: 2.89 MIL/uL — ABNORMAL LOW (ref 3.87–5.11)
RDW: 19.6 % — ABNORMAL HIGH (ref 11.5–15.5)
WBC: 12.1 10*3/uL — ABNORMAL HIGH (ref 4.0–10.5)
nRBC: 0 % (ref 0.0–0.2)

## 2022-12-03 LAB — BASIC METABOLIC PANEL
Anion gap: 9 (ref 5–15)
Anion gap: 11 (ref 5–15)
BUN: 25 mg/dL — ABNORMAL HIGH (ref 8–23)
BUN: 22 mg/dL (ref 8–23)
CO2: 22 mmol/L (ref 22–32)
CO2: 21 mmol/L — ABNORMAL LOW (ref 22–32)
Calcium: 6.8 mg/dL — ABNORMAL LOW (ref 8.9–10.3)
Calcium: 6.8 mg/dL — ABNORMAL LOW (ref 8.9–10.3)
Chloride: 104 mmol/L (ref 98–111)
Chloride: 103 mmol/L (ref 98–111)
Creatinine, Ser: 1.02 mg/dL — ABNORMAL HIGH (ref 0.44–1.00)
Creatinine, Ser: 0.99 mg/dL (ref 0.44–1.00)
GFR, Estimated: 57 mL/min — ABNORMAL LOW (ref >60)
GFR, Estimated: 59 mL/min — ABNORMAL LOW (ref >60)
Glucose, Bld: 127 mg/dL — ABNORMAL HIGH (ref 70–99)
Glucose, Bld: 174 mg/dL — ABNORMAL HIGH (ref 70–99)
Potassium: 2.5 mmol/L — CL (ref 3.5–5.1)
Potassium: 3.4 mmol/L — ABNORMAL LOW (ref 3.5–5.1)
Sodium: 135 mmol/L (ref 135–145)
Sodium: 135 mmol/L (ref 135–145)

## 2022-12-03 LAB — MAGNESIUM: Magnesium: 2.1 mg/dL (ref 1.7–2.4)

## 2022-12-03 LAB — TSH: TSH: 1.996 u[IU]/mL (ref 0.350–4.500)

## 2022-12-03 MED ORDER — POTASSIUM CHLORIDE 20 MEQ PO PACK
40.0000 meq | PACK | Freq: Once | ORAL | Status: AC
  Administered 2022-12-03: 40 meq via ORAL
  Filled 2022-12-03: qty 2

## 2022-12-03 MED ORDER — AMIODARONE HCL 200 MG PO TABS
200.0000 mg | ORAL_TABLET | Freq: Two times a day (BID) | ORAL | Status: AC
  Administered 2022-12-03 – 2022-12-10 (×14): 200 mg via ORAL
  Filled 2022-12-03 (×14): qty 1

## 2022-12-03 MED ORDER — POTASSIUM CHLORIDE CRYS ER 20 MEQ PO TBCR
40.0000 meq | EXTENDED_RELEASE_TABLET | Freq: Once | ORAL | Status: AC
  Administered 2022-12-03: 40 meq via ORAL
  Filled 2022-12-03: qty 2

## 2022-12-03 MED ORDER — AMIODARONE HCL 200 MG PO TABS
200.0000 mg | ORAL_TABLET | Freq: Every day | ORAL | Status: DC
  Administered 2022-12-11 – 2022-12-15 (×5): 200 mg via ORAL
  Filled 2022-12-03 (×5): qty 1

## 2022-12-03 MED ORDER — SIMETHICONE 80 MG PO CHEW
80.0000 mg | CHEWABLE_TABLET | Freq: Four times a day (QID) | ORAL | Status: DC | PRN
  Administered 2022-12-03 – 2022-12-04 (×3): 80 mg via ORAL
  Filled 2022-12-03 (×3): qty 1

## 2022-12-03 MED ORDER — POTASSIUM CHLORIDE CRYS ER 20 MEQ PO TBCR: 40.0000 meq | EXTENDED_RELEASE_TABLET | Freq: Once | ORAL | Status: DC

## 2022-12-03 MED ORDER — POTASSIUM CHLORIDE 10 MEQ/50ML IV SOLN
10.0000 meq | INTRAVENOUS | Status: AC
  Administered 2022-12-03 (×4): 10 meq via INTRAVENOUS
  Filled 2022-12-03 (×4): qty 50

## 2022-12-03 NOTE — Progress Notes (Signed)
NAME:  Nancy Hinton, MRN:  [id number], DOB:  1946/06/13, LOS: 3 ADMISSION DATE:  12/08/2022, CONSULTATION DATE:  12/03/22 REFERRING MD:  EDP, CHIEF COMPLAINT:  n/v/leg edema  History of Present Illness:  Nancy Hinton is a 77 y.o. F with PMH significant for CAD, atrial fibrillation on Eliquis, HFpEF, Type 2 DM, gastric bypass who presented to the ED by EMS for nausea and vomiting for one week with inability to tolerate anything by mouth and bilateral leg swelling.   She denied chest pain, significant diarrhea,  or abdominal pain, no URI symptoms, melena or fever.    In the ED, she was tachycardic in Afib RVR with HR in 140's and SBP as low as 83/63.  Labs showed WBC of 21k, Na 130, gluc 200, creatinine 1.15, lactic acid 3.2, INR 3.0, BNP 101, troponin 7.   CXR with small bilateral pleural effusions and possible LLL PNA.  She was given an amiodarone bolus, IVF, Vancomycin, Cefepime and Zosyn were ordered and PCCM consulted for admission.   Has not been able to ambulate for the last 3 to 4 months Has been evaluated multiple times for atrial fibrillation and progressive decline  Pertinent  Medical History   has a past medical history of Arthritis, Diabetes mellitus without complication ([place]), and Hypertension. Gastric bypass-2014   Significant Hospital Events: Including procedures, antibiotic start and stop dates in addition to other pertinent events   2/19 presented with n/v, in afib with RVR and concern for sepsis with hypotension, PCCM consulted 2/20 remains hypotensive, seen by cards. 2/21 stress dose steroids resumed given long term steroid supplementation, added back midodrine for chronic hypotension and known history of severe orthostasis.  Added back Synthroid.  Changing IV heparin to [place] 2/22 transitioned amiodarone to oral.  Discontinuing Reglan and Colace due to liquid stool.  BP target goal changed to systolic blood pressure greater than 100   Interim History /  Subjective:  Reporting diarrhea  Objective   Blood pressure 114/69, pulse 66, temperature (Abnormal) 97.4 F (36.3 C), temperature source Oral, resp. rate 15, height 5' 3"$  (1.6 m), weight 103 kg, SpO2 95 %.        Intake/Output Summary (Last 24 hours) at 12/03/2022 [id number] Last data filed at 12/03/2022 0749 Gross per 24 hour  Intake 1320.24 ml  Output 4000 ml  Net -2679.76 ml   Filed Weights   12/01/22 0200 12/02/22 0448 12/03/22 0500  Weight: 106.1 kg 109.9 kg 103 kg   General: Chronically ill obese 77 year old female lying in bed no acute distress HEENT normocephalic atraumatic no jugular venous distention appreciated Pulmonary: Diminished both bases no accessory use Cardiac: Regular rate and rhythm Abdomen: Soft nontender Extremities: Warm dry diffuse anasarca/lymphedema lower extremities Neuro: Awake oriented no focal deficits  Resolved problem list:  Lactic acidosis   Assessment & Plan:   Atrial fibrillation with RVR, now with CVR Plan Cont amiodarone -transition to oral  Can transition to DOAC today (after decide on thora) Cont tele   H/o HFpEF Plan Holding antihypertensives 2/2 hypotension Hold Lasix again today   Acute on Chronic  hypotension w/ known h/o orthostatic hypotension (was on florinef and midodrine pta) Plan Resumed Midodrine and added hydrocortisone in place of  florinef (she's been on florinef since January) Cont to wean NE for SBP > 90  Possible sepsis w/ LLL PNA & bilateral pleural effusions All cultures neg to date. Was MRSA PCR +.  Plan Abx day 4, currently on azith (day 3 and day  3 of CTX)->complete 5d of these  BC still pending I do not think thora indicated. Effusions same process as her anasarca   Fluid and Electrolyte imbalance: hyponatremia (worse s/p volume resuscitation), hypokalemia  Plan Replace K, recheck this afternoon  AKI Plan  SBP goal > 90 Renal dose meds Am chem  Nausea and vomiting->resolved. Now has liquid  stools -Abdominal CT negative for any significant finding, Plan dc metaclopramide Dc colase  Generalized anasarca w/ severe Protein calorie malnutrition History of bariatric surgery Plan Added Ensure if tolerated Will stop albumin today Eventually lasix   Iron deficiency anemia Plan Resumed FESO4 replacement   Type 2 diabetes w/ hyperglycemia  Plan Ssi   Hypothyroidism  Plan Continue Synthroid Check TSH  Best Practice (right click and "Reselect all SmartList Selections" daily)   Diet/type: Regular consistency (see orders) DVT prophylaxis: systemic heparin GI prophylaxis: N/A Lines: Central line Foley:  N/A Code Status:  full code Last date of multidisciplinary goals of care discussion [patient updated]   My cct 32 min   Erick Colace ACNP-BC [place] Pager # [phone] OR # [phone] if no answer

## 2022-12-03 NOTE — Progress Notes (Signed)
[facility] ADULT ICU REPLACEMENT PROTOCOL   The patient does apply for the [facility] Adult ICU Electrolyte Replacment Protocol based on the criteria listed below:   1.Exclusion criteria: TCTS, ECMO, Dialysis, and Myasthenia Gravis patients 2. Is GFR >/= 30 ml/min? Yes.    Patient's GFR today is 57 3. Is SCr </= 2? Yes.   Patient's SCr is 1.02 mg/dL 4. Did SCr increase >/= 0.5 in 24 hours? No. 5.Pt's weight >40kg  Yes.   6. Abnormal electrolyte(s): K 2.5  7. Electrolytes replaced per protocol 8.  Call MD STAT for K+ </= 2.5, Phos </= 1, or Mag </= 1 Physician:  Brandt Loosen, Galina Haddox A 12/03/2022 4:19 AM

## 2022-12-04 DIAGNOSIS — I4891 Unspecified atrial fibrillation: Secondary | ICD-10-CM | POA: Diagnosis not present

## 2022-12-04 DIAGNOSIS — J189 Pneumonia, unspecified organism: Secondary | ICD-10-CM | POA: Diagnosis not present

## 2022-12-04 DIAGNOSIS — R601 Generalized edema: Secondary | ICD-10-CM | POA: Diagnosis not present

## 2022-12-04 DIAGNOSIS — I959 Hypotension, unspecified: Secondary | ICD-10-CM | POA: Diagnosis not present

## 2022-12-04 LAB — GLUCOSE, CAPILLARY
Glucose-Capillary: 105 mg/dL — ABNORMAL HIGH (ref 70–99)
Glucose-Capillary: 122 mg/dL — ABNORMAL HIGH (ref 70–99)
Glucose-Capillary: 140 mg/dL — ABNORMAL HIGH (ref 70–99)
Glucose-Capillary: 158 mg/dL — ABNORMAL HIGH (ref 70–99)
Glucose-Capillary: 160 mg/dL — ABNORMAL HIGH (ref 70–99)
Glucose-Capillary: 194 mg/dL — ABNORMAL HIGH (ref 70–99)

## 2022-12-04 LAB — CBC
HCT: 23.8 % — ABNORMAL LOW (ref 36.0–46.0)
Hemoglobin: 8.1 g/dL — ABNORMAL LOW (ref 12.0–15.0)
MCH: 26 pg (ref 26.0–34.0)
MCHC: 34 g/dL (ref 30.0–36.0)
MCV: 76.5 fL — ABNORMAL LOW (ref 80.0–100.0)
Platelets: 224 10*3/uL (ref 150–400)
RBC: 3.11 MIL/uL — ABNORMAL LOW (ref 3.87–5.11)
RDW: 20.1 % — ABNORMAL HIGH (ref 11.5–15.5)
WBC: 9.5 10*3/uL (ref 4.0–10.5)
nRBC: 0.4 % — ABNORMAL HIGH (ref 0.0–0.2)

## 2022-12-04 LAB — BASIC METABOLIC PANEL
Anion gap: 5 (ref 5–15)
BUN: 21 mg/dL (ref 8–23)
CO2: 21 mmol/L — ABNORMAL LOW (ref 22–32)
Calcium: 6.7 mg/dL — ABNORMAL LOW (ref 8.9–10.3)
Chloride: 106 mmol/L (ref 98–111)
Creatinine, Ser: 0.87 mg/dL (ref 0.44–1.00)
GFR, Estimated: >60 mL/min (ref >60)
Glucose, Bld: 117 mg/dL — ABNORMAL HIGH (ref 70–99)
Potassium: 3.1 mmol/L — ABNORMAL LOW (ref 3.5–5.1)
Sodium: 132 mmol/L — ABNORMAL LOW (ref 135–145)

## 2022-12-04 LAB — MAGNESIUM: Magnesium: 2.1 mg/dL (ref 1.7–2.4)

## 2022-12-04 LAB — PHOSPHORUS: Phosphorus: 2.1 mg/dL — ABNORMAL LOW (ref 2.5–4.6)

## 2022-12-04 MED ORDER — POTASSIUM CHLORIDE CRYS ER 20 MEQ PO TBCR: 20.0000 meq | EXTENDED_RELEASE_TABLET | ORAL | Status: DC

## 2022-12-04 MED ORDER — POTASSIUM PHOSPHATES 15 MMOLE/5ML IV SOLN
15.0000 mmol | Freq: Once | INTRAVENOUS | Status: AC
  Administered 2022-12-04: 15 mmol via INTRAVENOUS
  Filled 2022-12-04: qty 5

## 2022-12-04 MED ORDER — POTASSIUM CHLORIDE 20 MEQ PO PACK
20.0000 meq | PACK | Freq: Two times a day (BID) | ORAL | Status: AC
  Administered 2022-12-04 (×2): 20 meq via ORAL
  Filled 2022-12-04 (×2): qty 1

## 2022-12-04 MED ORDER — SIMETHICONE 80 MG PO CHEW
160.0000 mg | CHEWABLE_TABLET | Freq: Four times a day (QID) | ORAL | Status: DC | PRN
  Administered 2022-12-04 (×2): 160 mg via ORAL
  Filled 2022-12-04 (×2): qty 2

## 2022-12-04 MED ORDER — MIDODRINE HCL 5 MG PO TABS
15.0000 mg | ORAL_TABLET | Freq: Three times a day (TID) | ORAL | Status: DC
  Administered 2022-12-04 – 2022-12-15 (×36): 15 mg via ORAL
  Filled 2022-12-04 (×37): qty 3

## 2022-12-04 MED ORDER — FUROSEMIDE 10 MG/ML IJ SOLN
40.0000 mg | Freq: Once | INTRAMUSCULAR | Status: AC
  Administered 2022-12-04: 40 mg via INTRAVENOUS
  Filled 2022-12-04: qty 4

## 2022-12-04 MED ORDER — POTASSIUM CHLORIDE 10 MEQ/50ML IV SOLN
10.0000 meq | INTRAVENOUS | Status: AC
  Administered 2022-12-04 (×4): 10 meq via INTRAVENOUS
  Filled 2022-12-04 (×4): qty 50

## 2022-12-04 NOTE — Progress Notes (Signed)
[facility] ADULT ICU REPLACEMENT PROTOCOL   The patient does apply for the [facility] Adult ICU Electrolyte Replacment Protocol based on the criteria listed below:   1.Exclusion criteria: TCTS, ECMO, Dialysis, and Myasthenia Gravis patients 2. Is GFR >/= 30 ml/min? Yes.    Patient's GFR today is >60 3. Is SCr </= 2? Yes.   Patient's SCr is 0.87 mg/dL 4. Did SCr increase >/= 0.5 in 24 hours? No. 5.Pt's weight >40kg  Yes.   6. Abnormal electrolyte(s): k 3.1  7. Electrolytes replaced per protocol 8.  Call MD STAT for K+ </= 2.5, Phos </= 1, or Mag </= 1 Physician:    Ronda Fairly A 12/04/2022 6:01 AM

## 2022-12-04 NOTE — Progress Notes (Signed)
NAME:  Nancy Hinton, MRN:  [id number], DOB:  [birth year], LOS: 4 ADMISSION DATE:  11/16/2022, CONSULTATION DATE:  12/04/22 REFERRING MD:  EDP, CHIEF COMPLAINT:  n/v/leg edema  History of Present Illness:  Nancy Hinton is a 77 y.o. F with PMH significant for CAD, atrial fibrillation on Eliquis, HFpEF, Type 2 DM, gastric bypass who presented to the ED by EMS for nausea and vomiting for one week with inability to tolerate anything by mouth and bilateral leg swelling.   She denied chest pain, significant diarrhea,  or abdominal pain, no URI symptoms, melena or fever.    In the ED, she was tachycardic in Afib RVR with HR in 140's and SBP as low as 83/63.  Labs showed WBC of 21k, Na 130, gluc 200, creatinine 1.15, lactic acid 3.2, INR 3.0, BNP 101, troponin 7.   CXR with small bilateral pleural effusions and possible LLL PNA.  She was given an amiodarone bolus, IVF, Vancomycin, Cefepime and Zosyn were ordered and PCCM consulted for admission.   Has not been able to ambulate for the last 3 to 4 months Has been evaluated multiple times for atrial fibrillation and progressive decline  Pertinent  Medical History   has a past medical history of Arthritis, Diabetes mellitus without complication ([place]), and Hypertension. Gastric bypass-2014   Significant Hospital Events: Including procedures, antibiotic start and stop dates in addition to other pertinent events   2/19 presented with n/v, in afib with RVR and concern for sepsis with hypotension, PCCM consulted 2/20 remains hypotensive, seen by cards. 2/21 stress dose steroids resumed given long term steroid supplementation, added back midodrine for chronic hypotension and known history of severe orthostasis.  Added back Synthroid.  Changing IV heparin to [place] 2/22 transitioned amiodarone to oral.  Discontinuing Reglan and Colace due to liquid stool.  BP target goal changed to systolic blood pressure greater than 100 2/23 increased midodrine to '15mg'$   tid, lasix x 1. Still on low dose NE but able to wean. PT consult ordered    Interim History / Subjective:  Diarrhea better, still has some gas discomfort.   Objective   Blood pressure (Abnormal) 125/99, pulse (Abnormal) 103, temperature 98.2 F (36.8 C), temperature source Oral, resp. rate 15, height '5\' 3"'$  (1.6 m), weight 103 kg, SpO2 98 %.        Intake/Output Summary (Last 24 hours) at 12/04/2022 0718 Last data filed at 12/04/2022 0600 Gross per 24 hour  Intake 865.84 ml  Output 355 ml  Net 510.84 ml   Filed Weights   12/01/22 0200 12/02/22 0448 12/03/22 0500  Weight: 106.1 kg 109.9 kg 103 kg   General chronically ill appearing white female. Laying in bed. She is in no acute distress HENT NCAT no JVD MMM Pulm clear Card rrr Abd soft Ext on-going LE lymphedema Neuro intact   Resolved problem list:  Lactic acidosis  AKI  Assessment & Plan:   Atrial fibrillation with RVR, now with CVR Plan Oral amiodarone DOAC Tele   H/o HFpEF Plan Holding antihypertensives 2/2 hypotension Lasix x 1 today  Acute on Chronic  hypotension w/ known h/o orthostatic hypotension (was on florinef and midodrine pta) Plan Resumed Midodrine (increased to '15mg'$  tid) and added hydrocortisone in place of  florinef (she's been on florinef since January) Cont to wean NE for SBP > 90 (we can be much more aggressive about this her BP is in 130s)  Possible sepsis w/ LLL PNA & bilateral pleural effusions All cultures neg  to date. Was MRSA PCR +.  Plan Abx day 5, currently on azith (day 4 and day 4 of CTX)->complete 5d of these  F/u BCs  I do not think thora indicated. Effusions same process as her anasarca   Fluid and Electrolyte imbalance: hyponatremia (worse s/p volume resuscitation), hypokalemia, hypophosphatemia  Plan Replaced K; recheck in am   Nausea and vomiting->resolved. liquid stools on 2/22 -Abdominal CT negative for any significant finding, Plan dc'd metoclopramide and colase on  2/22  Generalized anasarca w/ severe Protein calorie malnutrition History of bariatric surgery Plan Cont Ensure Eventually lasix   Iron deficiency anemia Plan FESO4 replacement   Type 2 diabetes w/ hyperglycemia  Plan Ssi (excellent control)  Hypothyroidism  Plan Continue Synthroid  Severe deconditioning Plan PT consult Care management consult-->she will need SNF  Best Practice (right click and "Reselect all SmartList Selections" daily)   Diet/type: Regular consistency (see orders) DVT prophylaxis: systemic heparin GI prophylaxis: N/A Lines: Central line Foley:  N/A Code Status:  full code Last date of multidisciplinary goals of care discussion [patient updated]    My cct 32 min   Erick Colace ACNP-BC [place] Pager # [phone] OR # [phone] if no answer

## 2022-12-05 DIAGNOSIS — R579 Shock, unspecified: Secondary | ICD-10-CM

## 2022-12-05 LAB — CULTURE, BLOOD (ROUTINE X 2)
Culture: NO GROWTH
Culture: NO GROWTH
Special Requests: ADEQUATE
Special Requests: ADEQUATE

## 2022-12-05 LAB — BASIC METABOLIC PANEL
Anion gap: 8 (ref 5–15)
BUN: 21 mg/dL (ref 8–23)
CO2: 21 mmol/L — ABNORMAL LOW (ref 22–32)
Calcium: 6.8 mg/dL — ABNORMAL LOW (ref 8.9–10.3)
Chloride: 105 mmol/L (ref 98–111)
Creatinine, Ser: 0.91 mg/dL (ref 0.44–1.00)
GFR, Estimated: >60 mL/min (ref >60)
Glucose, Bld: 114 mg/dL — ABNORMAL HIGH (ref 70–99)
Potassium: 4 mmol/L (ref 3.5–5.1)
Sodium: 134 mmol/L — ABNORMAL LOW (ref 135–145)

## 2022-12-05 LAB — CBC
HCT: 24.4 % — ABNORMAL LOW (ref 36.0–46.0)
Hemoglobin: 8.3 g/dL — ABNORMAL LOW (ref 12.0–15.0)
MCH: 26.2 pg (ref 26.0–34.0)
MCHC: 34 g/dL (ref 30.0–36.0)
MCV: 77 fL — ABNORMAL LOW (ref 80.0–100.0)
Platelets: 216 10*3/uL (ref 150–400)
RBC: 3.17 MIL/uL — ABNORMAL LOW (ref 3.87–5.11)
RDW: 20.5 % — ABNORMAL HIGH (ref 11.5–15.5)
WBC: 11.3 10*3/uL — ABNORMAL HIGH (ref 4.0–10.5)
nRBC: 0.4 % — ABNORMAL HIGH (ref 0.0–0.2)

## 2022-12-05 LAB — GLUCOSE, CAPILLARY
Glucose-Capillary: 88 mg/dL (ref 70–99)
Glucose-Capillary: 174 mg/dL — ABNORMAL HIGH (ref 70–99)
Glucose-Capillary: 151 mg/dL — ABNORMAL HIGH (ref 70–99)
Glucose-Capillary: 106 mg/dL — ABNORMAL HIGH (ref 70–99)
Glucose-Capillary: 122 mg/dL — ABNORMAL HIGH (ref 70–99)

## 2022-12-05 LAB — PHOSPHORUS: Phosphorus: 2.7 mg/dL (ref 2.5–4.6)

## 2022-12-05 LAB — MAGNESIUM: Magnesium: 2.1 mg/dL (ref 1.7–2.4)

## 2022-12-05 MED ORDER — CLONAZEPAM 0.5 MG PO TABS
0.5000 mg | ORAL_TABLET | Freq: Two times a day (BID) | ORAL | Status: DC | PRN
  Administered 2022-12-05 – 2022-12-16 (×8): 0.5 mg via ORAL
  Filled 2022-12-05 (×8): qty 1

## 2022-12-05 MED ORDER — GABAPENTIN 100 MG PO CAPS
100.0000 mg | ORAL_CAPSULE | Freq: Once | ORAL | Status: AC
  Administered 2022-12-05: 100 mg via ORAL
  Filled 2022-12-05: qty 1

## 2022-12-05 MED ORDER — LOPERAMIDE HCL 2 MG PO CAPS: 2.0000 mg | ORAL_CAPSULE | ORAL | Status: DC | PRN

## 2022-12-05 MED ORDER — MELATONIN 3 MG PO TABS
3.0000 mg | ORAL_TABLET | Freq: Once | ORAL | Status: AC
  Administered 2022-12-05: 3 mg via ORAL
  Filled 2022-12-05: qty 1

## 2022-12-05 NOTE — Evaluation (Signed)
Physical Therapy Evaluation Patient Details Name: Nancy Hinton MRN: [id number] DOB: [birth year] Today's Date: 12/05/2022  History of Present Illness  Nancy Hinton is a 77 y.o. F with PMH significant for CAD, atrial fibrillation on Eliquis, HFpEF, Type 2 DM, gastric bypass who presented to the ED [id number] EMS from SNF for nausea and vomiting for one week with inability to tolerate anything by mouth and bilateral leg swelling, tachycardic in Afib RVR with HR in 140's and SBP as low as 83/63.   CXR with small bilateral pleural effusions and possible LLL PNA.  Clinical Impression  Pt admitted with above diagnosis.  Pt currently with functional limitations due to the deficits listed below (see PT Problem List). Pt will benefit from skilled PT to increase their  mobility and decrease burden of care with mobility to allow discharge to the venue listed below.       The patient reports that she has not been able to stand x 4 months, prior to that time, was independent.   Patient relates limited therapy due to hypotension and syncope.  BP monitored while placing patient in bed/chair position.   BP supine(HOB raised) 102/67,  HOB 55* and legs dep at 30*  x 4 mins=122/79. Hob tilted /legs dependent at 30* with BP 118/83. Patient  remained in position x another 10 mins and  assisted with leg exercises. Patient reported mild nausea and reports a precursor to passing out in past. No further nausea noted and tolerated  activity upright and legs dependent x ~ 30 mins.   Recommendations for follow up therapy are one component of a multi-disciplinary discharge planning process, led by the attending physician.  Recommendations may be updated based on patient status, additional functional criteria and insurance authorization.  Follow Up Recommendations Long-term institutional care without follow-up therapy Can patient physically be transported by private vehicle: No    Assistance Recommended at Discharge  Frequent or constant Supervision/Assistance  Patient can return home with the following  Two people to help with walking and/or transfers;Two people to help with bathing/dressing/bathroom;Assist for transportation    Equipment Recommendations None recommended by PT  Recommendations for Other Services       Functional Status Assessment Patient has not had a recent decline in their functional status     Precautions / Restrictions Precautions Precaution Comments: legs weep, non ambulatory x 4 mos      Mobility  Bed Mobility Overal bed mobility: Needs Assistance             General bed mobility comments: bed placed in chair position, assisted patient to attempt to pull self foerward, unable to lean forward, did not attempt sitting on bed edge at this time    Transfers                   General transfer comment: requires mechanical lift    Ambulation/Gait                  Stairs            Wheelchair Mobility    Modified Rankin (Stroke Patients Only)       Balance                                             Pertinent Vitals/Pain Pain Assessment Pain Assessment: Faces Faces Pain Scale: Hurts even more Pain  Location: both legs Pain Descriptors / Indicators: Discomfort Pain Intervention(s): Limited activity within patient's tolerance, Monitored during session    Home Living Family/patient expects to be discharged to:: Skilled nursing facility                        Prior Function               Mobility Comments: has been bed bound at facility, limited mobility per patient ADLs Comments: requires assista at facility     Hand Dominance        Extremity/Trunk Assessment   Upper Extremity Assessment Upper Extremity Assessment: Overall WFL for tasks assessed    Lower Extremity Assessment Lower Extremity Assessment: LLE deficits/detail;RLE deficits/detail RLE Deficits / Details: noted dressings  intact at varoius sites. grossly 2/5 hip flex, kne ext, abd/add, dorsiflex 2/5, pt. reports legs feel numb,paralyzed LLE Deficits / Details: grossly 1+/5 hip  flex, and knee ext, dorsiflex 2/5, "feel paralyzed"    Cervical / Trunk Assessment Cervical / Trunk Assessment: Other exceptions Cervical / Trunk Exceptions: unable to pull self to sitting upright when in bed chair position.  Communication   Communication: No difficulties  Cognition Arousal/Alertness: Awake/alert Behavior During Therapy: WFL for tasks assessed/performed Overall Cognitive Status: Within Functional Limits for tasks assessed                                          General Comments General comments (skin integrity, edema, etc.): unable to test balance    Exercises General Exercises - Lower Extremity Ankle Circles/Pumps: AAROM, 20 reps, Both Short Arc Quad: AAROM, 20 reps, Both Heel Slides: AAROM, 20 reps, Both Hip ABduction/ADduction: AAROM, Both, 20 reps   Assessment/Plan    PT Assessment Patient needs continued PT services  PT Problem List Decreased strength;Decreased mobility;Decreased activity tolerance;Cardiopulmonary status limiting activity;Decreased range of motion       PT Treatment Interventions Functional mobility training;Therapeutic activities;Therapeutic exercise    PT Goals (Current goals can be found in the Care Plan section)  Acute Rehab PT Goals Patient Stated Goal: to be able to walk again PT Goal Formulation: With patient Time For Goal Achievement: 12/19/22 Potential to Achieve Goals: Fair    Frequency Min 2X/week     Co-evaluation               AM-PAC PT "6 Clicks" Mobility  Outcome Measure Help needed turning from your back to your side while in a flat bed without using bedrails?: Total Help needed moving from lying on your back to sitting on the side of a flat bed without using bedrails?: Total Help needed moving to and from a bed to a chair  (including a wheelchair)?: Total Help needed standing up from a chair using your arms (e.g., wheelchair or bedside chair)?: Total Help needed to walk in hospital room?: Total Help needed climbing 3-5 steps with a railing? : Total 6 Click Score: 6    End of Session   Activity Tolerance: Patient tolerated treatment well Patient left: in bed;with call bell/phone within reach Nurse Communication: Mobility status;Need for lift equipment PT Visit Diagnosis: Muscle weakness (generalized) (M62.81);Other symptoms and signs involving the nervous system (R29.898)    Time: [id number] PT Time Calculation (min) (ACUTE ONLY): 47 min   Charges:   PT Evaluation $PT Eval Low Complexity: 1 Low PT Treatments $Therapeutic Exercise: 8-22  mins $Therapeutic Activity: 8-22 mins        [place] Office [phone] Weekend [id number]   Claretha Cooper 12/05/2022, 12:49 PM

## 2022-12-05 NOTE — Progress Notes (Signed)
eLink Physician-Brief Progress Note Patient Name: Nancy Hinton DOB: 12/25/1945 MRN: [id number]   Date of Service  12/05/2022  HPI/Events of Note  Requesting something for leg pain, general dyscomfort with LLE, something for sleep  eICU Interventions  Has DM, could have neuropathic pain. Neurontin and melatonin ordered. Cr normal, also has anemia. Consider ruling out any iron deficiency for RLS?Marland Kitchen      Intervention Category Intermediate Interventions: Other:;Pain - evaluation and management  Elmer Sow 12/05/2022, 7:49 PM

## 2022-12-05 NOTE — Progress Notes (Signed)
Patient noted to be sobbing in her room. Patient states it's d/t anxiety. Patient states her daughter has been calling her non-stop, and would like her removed from her contact list. Will also speak to patient about changing her password. Medications ordered for patient's comfort. Will cluster care in order to increase patient's rest and sleep hygiene

## 2022-12-05 NOTE — Progress Notes (Signed)
   NAME:  Nancy Hinton, MRN:  [id number], DOB:  10/10/1946, LOS: 5 ADMISSION DATE:  11/15/2022, CONSULTATION DATE:  2/23 REFERRING MD:  Mayra Neer, CHIEF COMPLAINT:  nausea/diarrhea and leg swelling   History of Present Illness:  77 y/o female presented to [facility] on [facility] with nausea/diarrhea and leg swelling.  Noted to be tachcardic in Atrial fibrillation in ER, had small bilateral effusions and LLL pneumonia.  PCCM admitted for further management.  Pertinent  Medical History  DM2 Hypertension Gastric bypass in 2014  Significant Hospital Events: Including procedures, antibiotic start and stop dates in addition to other pertinent events   2/19 presented with n/v, in afib with RVR and concern for sepsis with hypotension, PCCM consulted CT abdomen without acute process, bilateral effusions, gallstones, adnexal cyst 2/20 remains hypotensive, seen by cards. 2/21 stress dose steroids resumed given long term steroid supplementation, added back midodrine for chronic hypotension and known history of severe orthostasis.  Added back Synthroid.  Changing IV heparin to [place] 2/22 transitioned amiodarone to oral.  Discontinuing Reglan and Colace due to liquid stool.  BP target goal changed to systolic blood pressure greater than 100 2/23 increased midodrine to 24m tid, lasix x 1. Still on low dose NE but able to wean. PT consult ordered   Interim History / Subjective:  Feels OK Wants to eat  Objective   Blood pressure 99/78, pulse (!) 109, temperature 97.9 F (36.6 C), temperature source Oral, resp. rate 20, height 5' 3"$  (1.6 m), weight 105.2 kg, SpO2 98 %.        Intake/Output Summary (Last 24 hours) at 12/05/2022 0946 Last data filed at 12/05/2022 0800 Gross per 24 hour  Intake 1269.64 ml  Output 970 ml  Net 299.64 ml   Filed Weights   12/02/22 0448 12/03/22 0500 12/04/22 0819  Weight: 109.9 kg 103 kg 105.2 kg    Examination:  General:  Resting comfortably in bed HENT: NCAT OP  clear PULM: CTA B, normal effort CV: RRR, no mgr GI: BS+, soft, nontender MSK: normal bulk and tone. Derm: massive leg edema bilaterally Neuro: awake, alert, no distress, MAEW   Resolved Hospital Problem list   Lactic acidosis AKI Nausea/vomiting  Assessment & Plan:  Atrial fibrillation, rate controlled HFpEF Amiodarone Eliquis Tele Hold antihypertensives Add compression stockings  Chronic hypotension Baseline orthostatic hypotension Continue midodrine Continue hydrocortisone Wean off levophed for SBP > 80  HCAP Complete antibiotics today Monitor respiratory status  Hyponatremia Hypokalemia Hypophosphatemia Monitor BMET and UOP Replace electrolytes as needed  Diarrhea Hold stool softeners Add loperamide  Hypothyroidism Continue synthroid  DM2  SSI Change to regular diet per patient request, she has managed carbs on her own at home for years  Fe def anemia Severe protein calorie malnutrition s/p gastric bypass Monitor for bleeding Transfuse PRBC for Hgb < 7 gm/dL  Severe deconditioning PT consult Will need SNF   Best Practice (right click and "Reselect all SmartList Selections" daily)   Diet/type: Regular consistency (see orders) DVT prophylaxis: DOAC GI prophylaxis: N/A Lines: Central line and yes and it is still needed Foley:  Yes, and it is still needed Code Status:  full code Last date of multidisciplinary goals of care discussion [full code per patient]  Critical care time: 35 minutes    BRoselie Awkward MD [facility] Pager: [phone]: [phone] 7:00 pm call Elink  [phone]

## 2022-12-06 DIAGNOSIS — A419 Sepsis, unspecified organism: Secondary | ICD-10-CM | POA: Diagnosis not present

## 2022-12-06 DIAGNOSIS — R6521 Severe sepsis with septic shock: Secondary | ICD-10-CM

## 2022-12-06 LAB — BASIC METABOLIC PANEL
Anion gap: 6 (ref 5–15)
BUN: 24 mg/dL — ABNORMAL HIGH (ref 8–23)
CO2: 21 mmol/L — ABNORMAL LOW (ref 22–32)
Calcium: 6.7 mg/dL — ABNORMAL LOW (ref 8.9–10.3)
Chloride: 108 mmol/L (ref 98–111)
Creatinine, Ser: 0.95 mg/dL (ref 0.44–1.00)
GFR, Estimated: >60 mL/min (ref >60)
Glucose, Bld: 106 mg/dL — ABNORMAL HIGH (ref 70–99)
Potassium: 3.9 mmol/L (ref 3.5–5.1)
Sodium: 135 mmol/L (ref 135–145)

## 2022-12-06 LAB — GLUCOSE, CAPILLARY
Glucose-Capillary: 102 mg/dL — ABNORMAL HIGH (ref 70–99)
Glucose-Capillary: 102 mg/dL — ABNORMAL HIGH (ref 70–99)
Glucose-Capillary: 103 mg/dL — ABNORMAL HIGH (ref 70–99)
Glucose-Capillary: 113 mg/dL — ABNORMAL HIGH (ref 70–99)
Glucose-Capillary: 133 mg/dL — ABNORMAL HIGH (ref 70–99)
Glucose-Capillary: 174 mg/dL — ABNORMAL HIGH (ref 70–99)
Glucose-Capillary: 312 mg/dL — ABNORMAL HIGH (ref 70–99)

## 2022-12-06 LAB — CBC
HCT: 22.5 % — ABNORMAL LOW (ref 36.0–46.0)
Hemoglobin: 7.5 g/dL — ABNORMAL LOW (ref 12.0–15.0)
MCH: 26.2 pg (ref 26.0–34.0)
MCHC: 33.3 g/dL (ref 30.0–36.0)
MCV: 78.7 fL — ABNORMAL LOW (ref 80.0–100.0)
Platelets: 204 10*3/uL (ref 150–400)
RBC: 2.86 MIL/uL — ABNORMAL LOW (ref 3.87–5.11)
RDW: 20.6 % — ABNORMAL HIGH (ref 11.5–15.5)
WBC: 10.1 10*3/uL (ref 4.0–10.5)
nRBC: 0.3 % — ABNORMAL HIGH (ref 0.0–0.2)

## 2022-12-06 LAB — MAGNESIUM: Magnesium: 2 mg/dL (ref 1.7–2.4)

## 2022-12-06 MED ORDER — MELATONIN 3 MG PO TABS
3.0000 mg | ORAL_TABLET | Freq: Once | ORAL | Status: AC
  Administered 2022-12-06: 3 mg via ORAL
  Filled 2022-12-06: qty 1

## 2022-12-06 MED ORDER — GABAPENTIN 100 MG PO CAPS
100.0000 mg | ORAL_CAPSULE | Freq: Every day | ORAL | Status: AC
  Administered 2022-12-06 – 2022-12-07 (×2): 100 mg via ORAL
  Filled 2022-12-06 (×2): qty 1

## 2022-12-06 NOTE — Progress Notes (Signed)
NAME:  Nancy Hinton, MRN:  [id number], DOB:  1946-03-21, LOS: 6 ADMISSION DATE:  11/14/2022, CONSULTATION DATE:  2/23 REFERRING MD:  Mayra Neer, CHIEF COMPLAINT:  nausea/diarrhea and leg swelling   History of Present Illness:  77 y/o female presented to [facility] on 2/19 with nausea/diarrhea and leg swelling.  Noted to be tachcardic in Atrial fibrillation in ER, had small bilateral effusions and LLL pneumonia.  PCCM admitted for further management.  Pertinent  Medical History  DM2 Hypertension Gastric bypass in 2014  Significant Hospital Events: Including procedures, antibiotic start and stop dates in addition to other pertinent events   2/19 presented with n/v, in afib with RVR and concern for sepsis with hypotension, PCCM consulted CT abdomen without acute process, bilateral effusions, gallstones, adnexal cyst 2/20 remains hypotensive, seen by cards. 2/21 stress dose steroids resumed given long term steroid supplementation, added back midodrine for chronic hypotension and known history of severe orthostasis.  Added back Synthroid.  Changing IV heparin to DOAC; Iron studies> not iron deficient 2/22 transitioned amiodarone to oral.  Discontinuing Reglan and Colace due to liquid stool.  BP target goal changed to systolic blood pressure greater than 100 2/23 increased midodrine to '15mg'$  tid, lasix x 1. Still on low dose NE but able to wean. PT consult ordered   Interim History / Subjective:  Anxiety on 2/24 related to daughter harassing her Neurontin and melatonin ordered for neuropathic pain and need for something for sleep  Objective   Blood pressure (!) 99/56, pulse (!) 107, temperature (!) 97.4 F (36.3 C), temperature source Axillary, resp. rate 15, height '5\' 3"'$  (1.6 m), weight 105.5 kg, SpO2 97 %.        Intake/Output Summary (Last 24 hours) at 12/06/2022 1009 Last data filed at 12/06/2022 [id number] Gross per 24 hour  Intake 409.1 ml  Output 550 ml  Net -140.9 ml   Filed Weights    12/03/22 0500 12/04/22 0819 12/05/22 0958  Weight: 103 kg 105.2 kg 105.5 kg    Examination:  Gen: well appearing HENT: OP clear, neck supple PULM: CTA B, normal effort  CV: RRR, no mgr GI: BS+, soft, nontender Derm: no cyanosis or rash, massive leg edema Neuro: drowsy today Psyche: normal mood and affect   Resolved Hospital Problem list   Lactic acidosis AKI Nausea/vomiting  Assessment & Plan:  Atrial fibrillation, rate controlled HFpEF Amiodarone Eliquis Tele Hold antihypertensives Compression stockings  Chronic hypotension Baseline orthostatic hypotension Continue midodrine Continue hydrocortisone Wean off levophed for SBP > 80  HCAP Monitor off antibiotics Monitor respirator status  Hyponatremia Hypokalemia Hypophosphatemia Monitor BMET and UOP Replace electrolytes as needed  Diarrhea Hold stool softeners loperamide  Hypothyroidism Continue synthroid  DM2  SSI Regular diet per patient request  Anemia of chronic disease, drop overnight 2/25 Severe protein calorie malnutrition s/p gastric bypass Monitor for bleeding Transfuse PRBC for Hgb < 7 gm/dL Repeat CBC in AM Continue nutrition efforts Continue home Fe supplementation  Severe deconditioning PT consult Will need SNF  Anxiety Clonazepam prn  Best Practice (right click and "Reselect all SmartList Selections" daily)   Diet/type: Regular consistency (see orders) DVT prophylaxis: DOAC GI prophylaxis: N/A Lines: Central line and yes and it is still needed Foley:  Yes, and it is still needed Code Status:  full code Last date of multidisciplinary goals of care discussion [full code per patient]  Transfer to [facility], SDU  Critical care time: n/a minutes    Roselie Awkward, MD [place] [facility] Pager: [phone] Cell: (  [phone] After 7:00 pm call Elink  [phone]

## 2022-12-06 NOTE — Progress Notes (Signed)
eLink Physician-Brief Progress Note Patient Name: Caryl Sidell DOB: 10/24/1945 MRN: [id number]   Date of Service  12/06/2022  HPI/Events of Note  Neuropathic pain and insomnia.   eICU Interventions  Neurontin and melatonin ordered, worked great last night.      Intervention Category Intermediate Interventions: Pain - evaluation and management  Elmer Sow 12/06/2022, 9:26 PM

## 2022-12-07 DIAGNOSIS — I890 Lymphedema, not elsewhere classified: Secondary | ICD-10-CM

## 2022-12-07 DIAGNOSIS — I480 Paroxysmal atrial fibrillation: Secondary | ICD-10-CM | POA: Diagnosis not present

## 2022-12-07 DIAGNOSIS — I951 Orthostatic hypotension: Secondary | ICD-10-CM

## 2022-12-07 DIAGNOSIS — Z9884 Bariatric surgery status: Secondary | ICD-10-CM

## 2022-12-07 DIAGNOSIS — I10 Essential (primary) hypertension: Secondary | ICD-10-CM

## 2022-12-07 DIAGNOSIS — E119 Type 2 diabetes mellitus without complications: Secondary | ICD-10-CM

## 2022-12-07 DIAGNOSIS — R601 Generalized edema: Secondary | ICD-10-CM | POA: Diagnosis not present

## 2022-12-07 LAB — IRON AND TIBC: Iron: 25 ug/dL — ABNORMAL LOW (ref 28–170)

## 2022-12-07 LAB — GLUCOSE, CAPILLARY
Glucose-Capillary: 93 mg/dL (ref 70–99)
Glucose-Capillary: 110 mg/dL — ABNORMAL HIGH (ref 70–99)
Glucose-Capillary: 126 mg/dL — ABNORMAL HIGH (ref 70–99)
Glucose-Capillary: 139 mg/dL — ABNORMAL HIGH (ref 70–99)
Glucose-Capillary: 119 mg/dL — ABNORMAL HIGH (ref 70–99)
Glucose-Capillary: 181 mg/dL — ABNORMAL HIGH (ref 70–99)

## 2022-12-07 LAB — CBC
HCT: 22.9 % — ABNORMAL LOW (ref 36.0–46.0)
Hemoglobin: 7.6 g/dL — ABNORMAL LOW (ref 12.0–15.0)
MCH: 26.3 pg (ref 26.0–34.0)
MCHC: 33.2 g/dL (ref 30.0–36.0)
MCV: 79.2 fL — ABNORMAL LOW (ref 80.0–100.0)
Platelets: 217 10*3/uL (ref 150–400)
RBC: 2.89 MIL/uL — ABNORMAL LOW (ref 3.87–5.11)
RDW: 20.4 % — ABNORMAL HIGH (ref 11.5–15.5)
WBC: 12.9 10*3/uL — ABNORMAL HIGH (ref 4.0–10.5)
nRBC: 0.5 % — ABNORMAL HIGH (ref 0.0–0.2)

## 2022-12-07 LAB — COMPREHENSIVE METABOLIC PANEL
ALT: 45 U/L — ABNORMAL HIGH (ref 0–44)
AST: 49 U/L — ABNORMAL HIGH (ref 15–41)
Albumin: 2.1 g/dL — ABNORMAL LOW (ref 3.5–5.0)
Alkaline Phosphatase: 108 U/L (ref 38–126)
Anion gap: 8 (ref 5–15)
BUN: 31 mg/dL — ABNORMAL HIGH (ref 8–23)
CO2: 21 mmol/L — ABNORMAL LOW (ref 22–32)
Calcium: 6.8 mg/dL — ABNORMAL LOW (ref 8.9–10.3)
Chloride: 107 mmol/L (ref 98–111)
Creatinine, Ser: 1.13 mg/dL — ABNORMAL HIGH (ref 0.44–1.00)
GFR, Estimated: 51 mL/min — ABNORMAL LOW (ref >60)
Glucose, Bld: 108 mg/dL — ABNORMAL HIGH (ref 70–99)
Potassium: 3.8 mmol/L (ref 3.5–5.1)
Sodium: 136 mmol/L (ref 135–145)
Total Bilirubin: 0.6 mg/dL (ref 0.3–1.2)
Total Protein: 4 g/dL — ABNORMAL LOW (ref 6.5–8.1)

## 2022-12-07 LAB — FERRITIN: Ferritin: 18 ng/mL (ref 11–307)

## 2022-12-07 MED ORDER — TRAZODONE HCL 50 MG PO TABS
25.0000 mg | ORAL_TABLET | Freq: Every day | ORAL | Status: DC
  Administered 2022-12-07 – 2022-12-15 (×9): 25 mg via ORAL
  Filled 2022-12-07 (×9): qty 1

## 2022-12-07 NOTE — Progress Notes (Addendum)
PROGRESS NOTE    Nancy Hinton  [id number] DOB: 12/03/1945 DOA: 12/02/2022 PCP: Ramiro Harvest, PA-C    Brief Narrative:  23  female with past medical history of type 2 diabetes, hypertension, gastric bypass in 2014 presented to hospital with nausea vomiting and leg swelling.  In the ED, patient was noted to have atrial fibrillation with RVR and there was some concern for sepsis with hypotension.  Critical care team was consulted and patient was admitted to the ICU secondary to hypotension.  Patient was put on stress dose steroids given long-term steroid supplementation and midodrine was started.  Patient was initially on amiodarone drip in the ICU which was changed to oral and patient was subsequently considered stable for transfer out of the ICU.  Assessment and plan.  Paroxysmal atrial fibrillation/HFpEF Continue amiodarone and Eliquis.  Antihypertensives currently on hold.  Has been started on midodrine.  Rate controlled at this time.  Chronic hypotension/Baseline orthostatic hypotension Currently on midodrine and hydrocortisone.  Has been weaned off Levophed.  Overall generalized weakness.   Healthcare associated pneumonia. Completed course of antibiotic.  Continue to monitor closely.   Hyponatremia/Hypokalemia/Hypophosphatemia Replenished.  Latest magnesium of 2.0.  Latest phosphorus of 2.7.  Sodium has normalized.  Will continue to monitor and replenish as necessary.   Diarrhea Needed loperamide.   Hypothyroidism Continue synthroid   DM2  Sliding scale insulin.  On regular diet as per patient.  Overall controlled.  Continue to monitor closely.   Anemia of chronic disease,  Severe protein calorie malnutrition s/p gastric bypass No obvious bleeding.  Latest hemoglobin of 7.6.  Continue iron supplementation.  Severe deconditioning Physical therapy has recommended skilled nursing facility placement/long-term care..   Anxiety Continue clonazepam prn      DVT prophylaxis: SCDs Start: 11/16/2022 2326 apixaban (ELIQUIS) tablet 5 mg   Code Status:     Code Status: Full Code  Disposition: Skilled nursing facility likely in 1 to 2 days  Status is: Inpatient  Remains inpatient appropriate because: Deconditioning debility, hypertension, multiple comorbidities,   Family Communication: None at bedside.  I tried to reach the patient's cousin [name], at least care power of attorney on the phone but was unable to reach him today.  Consultants:  PCCM  Procedures:  None  Antimicrobials:  Completed Rocephin and Zithromax  Subjective: Today, patient was seen and examined at bedside.  Patient complains of generalized fatigue and weakness and deconditioning.  Denies any nausea vomiting.  Has mild sore throat.  Objective: Vitals:   12/07/22 0500 12/07/22 0800 12/07/22 0900 12/07/22 1000  BP:  135/74 (!) 88/53 90/62  Pulse:   (!) 112 74  Resp:  '14 20 10  '$ Temp:  (!) 97.4 F (36.3 C)    TempSrc:  Oral    SpO2:  99% 98% 96%  Weight: 107.7 kg     Height:        Intake/Output Summary (Last 24 hours) at 12/07/2022 1026 Last data filed at 12/07/2022 1000 Gross per 24 hour  Intake 365.73 ml  Output 275 ml  Net 90.73 ml   Filed Weights   12/05/22 0958 12/06/22 0900 12/07/22 0500  Weight: 105.5 kg 108 kg 107.7 kg    Physical Examination: Body mass index is 42.06 kg/m.  General: Obese built, not in obvious distress anxious, appears pale and deconditioned. HENT:   Pallor noted.. Oral mucosa is moist.  Chest:  Clear breath sounds.  Diminished breath sounds bilaterally. No crackles or wheezes.  CVS: S1 &S2  heard. No murmur.  Regular rate and rhythm. Abdomen: Soft, nontender, nondistended.  Bowel sounds are heard.   Extremities: No cyanosis, clubbing with bilateral lower extremity massive edema.  Peripheral pulses are palpable. Psych: Alert, awake and oriented, anxious with flat affect. CNS:  No cranial nerve deficits.  Generalized weakness  noted. Skin: Warm and dry.  No rashes noted.  Data Reviewed:   CBC: Recent Labs  Lab 11/29/2022 1510 12/01/22 0219 12/03/22 0337 12/04/22 0455 12/05/22 0440 12/06/22 0414 12/07/22 0505  WBC 21.2*   < > 12.1* 9.5 11.3* 10.1 12.9*  NEUTROABS 18.8*  --   --   --   --   --   --   HGB 11.8*   < > 7.6* 8.1* 8.3* 7.5* 7.6*  HCT 34.8*   < > 21.7* 23.8* 24.4* 22.5* 22.9*  MCV 77.5*   < > 75.1* 76.5* 77.0* 78.7* 79.2*  PLT 397   < > 227 224 216 204 217   < > = values in this interval not displayed.    Basic Metabolic Panel: Recent Labs  Lab 12/01/22 0219 12/02/22 1013 12/03/22 1659 12/04/22 0455 12/05/22 0440 12/06/22 0414 12/07/22 0505  NA 127*   < > 135 132* 134* 135 136  K 3.3*   < > 3.4* 3.1* 4.0 3.9 3.8  CL 96*   < > 103 106 105 108 107  CO2 20*   < > 21* 21* 21* 21* 21*  GLUCOSE 191*   < > 174* 117* 114* 106* 108*  BUN 30*   < > '22 21 21 '$ 24* 31*  CREATININE 1.29*  1.26*   < > 0.99 0.87 0.91 0.95 1.13*  CALCIUM 6.7*   < > 6.8* 6.7* 6.8* 6.7* 6.8*  MG 1.9  --  2.1 2.1 2.1 2.0  --   PHOS 4.8*  --   --  2.1* 2.7  --   --    < > = values in this interval not displayed.    Liver Function Tests: Recent Labs  Lab 11/14/2022 1510 12/07/22 0505  AST 29 49*  ALT 26 45*  ALKPHOS 120 108  BILITOT 0.8 0.6  PROT 4.0* 4.0*  ALBUMIN <1.5* 2.1*     Radiology Studies: No results found.    LOS: 7 days   Flora Lipps, MD [facility] Available via Epic secure chat 7am-7pm After these hours, please refer to coverage provider listed on [email or web address] 12/07/2022, 10:26 AM

## 2022-12-07 NOTE — Progress Notes (Signed)
Received a call from a female requesting information about the patient. Caller stated that they were from a law firm, although their speech was slurred and incomprehensible. Due to patient's XXX status, informed caller that we cannot confirm or deny that the patient is present , and are unable to provide them any information. Caller continued to mumble into the phone. Phone call ended when caller stopped responding to questions.

## 2022-12-07 NOTE — Hospital Course (Addendum)
52  female with past medical history of type 2 diabetes, hypertension, gastric bypass in 2014 presented to hospital with nausea vomiting and leg swelling.  In the ED, patient was noted to have atrial fibrillation with RVR and there was some concern for sepsis with hypotension.  Critical care team was consulted and patient was admitted to the ICU secondary to hypotension.  Patient was put on stress dose steroids given long-term steroid supplementation and midodrine was started.  Patient was initially on amiodarone drip in the ICU which was changed to oral and patient was subsequently considered stable for transfer out of the ICU.  Assessment and plan.  Atrial fibrillation, rate controlled HFpEF Continue amiodarone and Eliquis.  Antihypertensives currently on hold.  Has been started on midodrine.    Chronic hypotension Baseline orthostatic hypotension Currently on midodrine and hydrocortisone.  Has been weaned off Levophed.   Healthcare associated pneumonia. Completed course of antibiotic.  Continue to monitor closely.   Hyponatremia Hypokalemia Hypophosphatemia Replenished.  Latest magnesium of 2.0.  Latest phosphorus of 2.7.  Sodium has normalized.   Diarrhea Needed loperamide.   Hypothyroidism Continue synthroid   DM2  Sliding scale insulin.  On regular diet as per patient.  Overall controlled.   Anemia of chronic disease,  Severe protein calorie malnutrition s/p gastric bypass No obvious bleeding.  Latest hemoglobin of 7.6.  Continue iron supplementation.  Severe deconditioning Physical therapy has recommended skilled nursing facility placement/long-term care..   Anxiety Continue clonazepam prn

## 2022-12-08 DIAGNOSIS — Z9884 Bariatric surgery status: Secondary | ICD-10-CM | POA: Diagnosis not present

## 2022-12-08 DIAGNOSIS — E119 Type 2 diabetes mellitus without complications: Secondary | ICD-10-CM | POA: Diagnosis not present

## 2022-12-08 DIAGNOSIS — R601 Generalized edema: Secondary | ICD-10-CM | POA: Diagnosis not present

## 2022-12-08 DIAGNOSIS — I10 Essential (primary) hypertension: Secondary | ICD-10-CM | POA: Diagnosis not present

## 2022-12-08 LAB — GLUCOSE, CAPILLARY
Glucose-Capillary: 98 mg/dL (ref 70–99)
Glucose-Capillary: 126 mg/dL — ABNORMAL HIGH (ref 70–99)
Glucose-Capillary: 168 mg/dL — ABNORMAL HIGH (ref 70–99)
Glucose-Capillary: 144 mg/dL — ABNORMAL HIGH (ref 70–99)
Glucose-Capillary: 148 mg/dL — ABNORMAL HIGH (ref 70–99)

## 2022-12-08 LAB — CBC
HCT: 22.8 % — ABNORMAL LOW (ref 36.0–46.0)
Hemoglobin: 7.5 g/dL — ABNORMAL LOW (ref 12.0–15.0)
MCH: 26.4 pg (ref 26.0–34.0)
MCHC: 32.9 g/dL (ref 30.0–36.0)
MCV: 80.3 fL (ref 80.0–100.0)
Platelets: 205 10*3/uL (ref 150–400)
RBC: 2.84 MIL/uL — ABNORMAL LOW (ref 3.87–5.11)
RDW: 20.5 % — ABNORMAL HIGH (ref 11.5–15.5)
WBC: 12 10*3/uL — ABNORMAL HIGH (ref 4.0–10.5)
nRBC: 0.2 % (ref 0.0–0.2)

## 2022-12-08 LAB — BASIC METABOLIC PANEL
Anion gap: 4 — ABNORMAL LOW (ref 5–15)
BUN: 36 mg/dL — ABNORMAL HIGH (ref 8–23)
CO2: 21 mmol/L — ABNORMAL LOW (ref 22–32)
Calcium: 6.5 mg/dL — ABNORMAL LOW (ref 8.9–10.3)
Chloride: 109 mmol/L (ref 98–111)
Creatinine, Ser: 1.09 mg/dL — ABNORMAL HIGH (ref 0.44–1.00)
GFR, Estimated: 53 mL/min — ABNORMAL LOW (ref >60)
Glucose, Bld: 96 mg/dL (ref 70–99)
Potassium: 3.8 mmol/L (ref 3.5–5.1)
Sodium: 134 mmol/L — ABNORMAL LOW (ref 135–145)

## 2022-12-08 MED ORDER — FUROSEMIDE 10 MG/ML IJ SOLN
40.0000 mg | Freq: Once | INTRAMUSCULAR | Status: AC
  Administered 2022-12-08: 40 mg via INTRAVENOUS
  Filled 2022-12-08: qty 4

## 2022-12-08 MED ORDER — ALBUMIN HUMAN 25 % IV SOLN
12.5000 g | Freq: Once | INTRAVENOUS | Status: AC
  Administered 2022-12-08: 12.5 g via INTRAVENOUS
  Filled 2022-12-08: qty 50

## 2022-12-08 NOTE — Progress Notes (Signed)
PROGRESS NOTE    Nancy Hinton  [id number] DOB: [birth year] DOA: 11/18/2022 PCP: Ramiro Harvest, PA-C    Brief Narrative:  12  female with past medical history of type 2 diabetes, hypertension, gastric bypass in 2014 presented to hospital with nausea vomiting and leg swelling.  In the ED, patient was noted to have atrial fibrillation with RVR and there was some concern for sepsis with hypotension.  Critical care team was consulted and patient was admitted to the ICU secondary to hypotension.  Patient was put on stress dose steroids given long-term steroid supplementation and midodrine was started.  Patient was initially on amiodarone drip in the ICU which was changed to oral and patient was subsequently considered stable for transfer out of the ICU.  Assessment and plan.  Paroxysmal atrial fibrillation/HFpEF Continue amiodarone and Eliquis.  Antihypertensives currently on hold.  Has been started on midodrine.  Rate controlled at this time.  Chronic hypotension/Baseline orthostatic hypotension Currently on midodrine and hydrocortisone.  Has been weaned off Levophed.  Overall generalized weakness.  States that she is unable to do anything much.  Blood pressure slightly better this morning.  On high-dose of midodrine and hydrocortisone every 6 hourly.Marland Kitchen   Healthcare associated pneumonia. Completed course of antibiotic.  Continue to monitor closely.  Currently on room air.  Mild leukocytosis but on steroids.   Hyponatremia/Hypokalemia/Hypophosphatemia Replenished.  Latest magnesium of 2.0.  Latest phosphorus of 2.7.  Sodium level at 134 today.  Will continue to monitor and replenish as necessary.   Hypothyroidism Continue synthroid   DM2  Sliding scale insulin.  On regular diet as per patient.  Overall controlled.  Continue to monitor closely.   Anemia of chronic disease,  Severe protein calorie malnutrition s/p gastric bypass No obvious bleeding.  Latest hemoglobin of 7.6.   Continue iron supplementation.  Latest albumin was 2.1.  Severe deconditioning Physical therapy has recommended skilled nursing facility placement/long-term care..   Anxiety Continue clonazepam prn  Generalized lower extremity edema/anasarca.  Likely secondary to hypoalbuminemia.  Patient is positive balance for 131 1 mL so far.  Will try albumin albumin infusion and try diuretic if able to tolerate.  Spoke about increasing oral intake.  Continue Ensure supplement.  Patient was on Lasix 40 daily at home.  Low mood feels depressed but was able to talk with chaplain yesterday.  Offered psychiatry help but she did not want to talk to a psychiatrist at this time.     DVT prophylaxis: SCDs Start: 11/20/2022 2326 apixaban (ELIQUIS) tablet 5 mg   Code Status:     Code Status: Full Code  Disposition: Skilled nursing facility likely in 1 to 2 days when blood pressure improved,  Status is: Inpatient  Remains inpatient appropriate because: Deconditioning, debility, orthostatic, multiple comorbidities, anasarca, poor oral intake.   Family Communication:  None at bedside.  I again tried to reach the patient's cousin [name], health care power of attorney on the phone but was unable to reach him today.  Patient  stated that the has been well-informed and I do not need to reach out to him.  Consultants:  PCCM  Procedures:  None  Antimicrobials:  Completed Rocephin and Zithromax  Subjective: Today, patient was seen and examined at bedside.  Complains of generalized fatigue, weakness.  Feels a little depressed about her health condition.  Has not been eating much.  Concerned about increasing swelling in her lower extremities and unable to ambulate.  Objective: Vitals:   12/08/22 0500 12/08/22  0600 12/08/22 0800 12/08/22 1045  BP: (!) 87/54 (!) 86/60    Pulse: 80     Resp: 14 16    Temp:   (!) 97.5 F (36.4 C) (!) 97.4 F (36.3 C)  TempSrc:   Oral Oral  SpO2: 95%     Weight: 108.6 kg      Height:        Intake/Output Summary (Last 24 hours) at 12/08/2022 1103 Last data filed at 12/08/2022 1015 Gross per 24 hour  Intake --  Output 450 ml  Net -450 ml    Filed Weights   12/06/22 0900 12/07/22 0500 12/08/22 0500  Weight: 108 kg 107.7 kg 108.6 kg    Physical Examination: Body mass index is 42.41 kg/m.   General: Obese built, appears deconditioned and weak, pale, alert awake and Communicative, not in obvious distress, HENT:   Pallor noted.  Oral mucosa is moist. Chest:  Clear breath sounds.  Diminished breath sounds bilaterally. No crackles or wheezes.  CVS: S1 &S2 heard. No murmur.  Regular rate and rhythm. Abdomen: Soft, nontender, nondistended.  Bowel sounds are heard.   Extremities: No cyanosis, bilateral lower extremity lymphedema, anasarca.  Psych: Alert, awake and oriented, flat affect, CNS:  No cranial nerve deficits.  Generalized weakness noted. Skin: Warm and dry.  Data Reviewed:   CBC: Recent Labs  Lab 12/04/22 0455 12/05/22 0440 12/06/22 0414 12/07/22 0505 12/08/22 0400  WBC 9.5 11.3* 10.1 12.9* 12.0*  HGB 8.1* 8.3* 7.5* 7.6* 7.5*  HCT 23.8* 24.4* 22.5* 22.9* 22.8*  MCV 76.5* 77.0* 78.7* 79.2* 80.3  PLT 224 216 204 217 205     Basic Metabolic Panel: Recent Labs  Lab 12/03/22 1659 12/04/22 0455 12/05/22 0440 12/06/22 0414 12/07/22 0505 12/08/22 0400  NA 135 132* 134* 135 136 134*  K 3.4* 3.1* 4.0 3.9 3.8 3.8  CL 103 106 105 108 107 109  CO2 21* 21* 21* 21* 21* 21*  GLUCOSE 174* 117* 114* 106* 108* 96  BUN '22 21 21 '$ 24* 31* 36*  CREATININE 0.99 0.87 0.91 0.95 1.13* 1.09*  CALCIUM 6.8* 6.7* 6.8* 6.7* 6.8* 6.5*  MG 2.1 2.1 2.1 2.0  --   --   PHOS  --  2.1* 2.7  --   --   --      Liver Function Tests: Recent Labs  Lab 12/07/22 0505  AST 49*  ALT 45*  ALKPHOS 108  BILITOT 0.6  PROT 4.0*  ALBUMIN 2.1*      Radiology Studies: No results found.    LOS: 8 days   Flora Lipps, MD [facility] Available via  Epic secure chat 7am-7pm After these hours, please refer to coverage provider listed on [email or web address] 12/08/2022, 11:03 AM

## 2022-12-08 NOTE — TOC Initial Note (Addendum)
Transition of Care [facility]) - Initial/Assessment Note    Patient Details  Name: Nancy Hinton MRN: [id number] Date of Birth: 05/27/1946  Transition of Care [facility]) CM/SW Contact:    Nancy Kaufman, RN Phone Number: 12/08/2022, 1:33 PM  Clinical Narrative:     This RNCM consulted for SNF placement. Per chart review PT has recommended Long term institutional care without follow up therapy. This RNCM spoke with patient at bedside who reports her cousin has renovated a Social research officer, government for her to live in. Patient reports she was at [facility] and does not wish to go back there. This RNCM advised patient that [name] would need to locate her a new SNF. Patient reports she thinks [facility] discharged her, as [name] was attempting to get her set up for LTC and she does not wish to sign over her check, she prefers to go live with her family( cousin) in [place]. Patient reports she has called her insurance company and was advised the insurance will pay for her to be transported to [facility], however she will be responsible for $300. This RNCM explained the SNF placement process, indicating the hospital does not place for LTC as patient does not have the insurance to be placed long term. Patient reports she did apply to [facility] Medicaid however was denied indicating she has too many assets.    This RNCM left a voicemail for patient's cousinAltamese Hinton on file [id number] and [phone]. This RNCM left a voicemail message with Nancy Hinton with [employer]. Awaiting call back.   - 2:03pm This RNCM spoke with Nancy Hinton [facility]) to advised Nancy Hinton our PT team has recommended LTC. Nancy Hinton reports once patient is medically stable she will be discharged home with him and his mother. This RNCM suggest Nancy Hinton of private duty nursing to assist with patient care once she gets home.   - 2:16 pm This RNCM spoke with Nancy Hinton with [name] who reports patient has been discharged from [facility].    TOC will continue to follow.                Expected Discharge Plan: Skilled Nursing Facility Barriers to Discharge: Continued Medical Work up   Patient Goals and CMS Choice Patient states their goals for this hospitalization and ongoing recovery are:: "I want to go home, not to a LTC facility" CMS Medicare.gov Compare Post Acute Care list provided to:: Patient Choice offered to / list presented to : Patient [place] ownership interest in [facility].provided to:: Patient    Expected Discharge Plan and Services In-house Referral: NA Discharge Planning Services: CM Consult   Living arrangements for the past 2 months: [place]                 DME Arranged: N/A DME Agency: NA       HH Arranged: NA HH Agency: NA        Prior Living Arrangements/Services Living arrangements for the past 2 months: [place] Lives with:: Facility Resident Patient language and need for interpreter reviewed:: Yes Do you feel safe going back to the place where you live?: Yes      Need for Family Participation in Patient Care: No (Comment) Care giver support system in place?: Yes (comment) Current home services: Other (comment) (none) Criminal Activity/Legal Involvement Pertinent to Current Situation/Hospitalization: No - Comment as needed  Activities of Daily Living Home Assistive Devices/Equipment: Other (Comment) (from snf) ADL Screening (condition at time  of admission) Patient's cognitive ability adequate to safely complete daily activities?: Yes Is the patient deaf or have difficulty hearing?: No Does the patient have difficulty seeing, even when wearing glasses/contacts?: No Does the patient have difficulty concentrating, remembering, or making decisions?: No Patient able to express need for assistance with ADLs?: Yes Does the patient have difficulty dressing or bathing?: Yes Independently performs ADLs?: No Does the patient have difficulty  walking or climbing stairs?: Yes Weakness of Legs: Both Weakness of Arms/Hands: Both  Permission Sought/Granted Permission sought to share information with : Case Manager Permission granted to share information with : Yes, Verbal Permission Granted  Share Information with NAME: Case manager           Emotional Assessment Appearance:: Appears stated age Attitude/Demeanor/Rapport: Gracious Affect (typically observed): Accepting Orientation: : Oriented to Self, Oriented to Place, Oriented to Situation Alcohol / Substance Use: Not Applicable Psych Involvement: No (comment)  Admission diagnosis:  Atrial fibrillation ([place]) [I48.91] Nausea [R11.0] Peripheral edema [R60.9] Atrial fibrillation with RVR ([facility]) [I48.91] Hypotension, unspecified hypotension type [I95.9] Pneumonia of both lower lobes due to infectious organism [J18.9] Patient Active Problem List   Diagnosis Date Noted   Anasarca 12/01/2022   Pressure injury of skin 12/01/2022   Multifocal atrial tachycardia 10/23/2022   Diarrhea 10/20/2022   Hypoalbuminemia 10/20/2022   Atrial fibrillation ([place]) 08/14/2022   Heart failure with preserved ejection fraction ([place]) 08/14/2022   Orthostatic hypotension 08/14/2022   Lymphedema 07/27/2022   Type 2 diabetes mellitus without complication, without long-term current use of insulin ([place]) 12/16/2018   Iron deficiency anemia secondary to inadequate dietary iron intake 02/04/2018   Osteoporosis 12/20/2015   Vitamin D deficiency 10/08/2015   Thyroid activity decreased 10/08/2015   Arthritis 10/08/2015   H/O gastric bypass 10/08/2015   Anemia, iron deficiency 12/21/2014   Essential hypertension 12/21/2014   PCP:  Nancy Harvest, PA-C Pharmacy:   [facility] [phone] [address], [address] AT [facility] [address] [place] [address] Phone: [phone] Fax: [phone]  [facility] - [place], [address] [address] Phone: [phone] Fax: [phone]     Social Determinants of Health (SDOH) Social History: SDOH Screenings   Food Insecurity: No Food Insecurity (12/01/2022)  Housing: Low Risk  (12/01/2022)  Transportation Needs: No Transportation Needs (12/01/2022)  Utilities: Not At Risk (12/01/2022)  Tobacco Use: Low Risk  (11/12/2022)   SDOH Interventions:     Readmission Risk Interventions    12/08/2022    1:26 PM  Readmission Risk Prevention Plan  Transportation Screening Complete  PCP or Specialist Appt within 5-7 Days Complete  Home Care Screening Complete  Medication Review (RN CM) Complete

## 2022-12-09 DIAGNOSIS — R601 Generalized edema: Secondary | ICD-10-CM | POA: Diagnosis not present

## 2022-12-09 DIAGNOSIS — I10 Essential (primary) hypertension: Secondary | ICD-10-CM | POA: Diagnosis not present

## 2022-12-09 DIAGNOSIS — E119 Type 2 diabetes mellitus without complications: Secondary | ICD-10-CM | POA: Diagnosis not present

## 2022-12-09 DIAGNOSIS — Z9884 Bariatric surgery status: Secondary | ICD-10-CM | POA: Diagnosis not present

## 2022-12-09 LAB — GLUCOSE, CAPILLARY
Glucose-Capillary: 112 mg/dL — ABNORMAL HIGH (ref 70–99)
Glucose-Capillary: 125 mg/dL — ABNORMAL HIGH (ref 70–99)
Glucose-Capillary: 125 mg/dL — ABNORMAL HIGH (ref 70–99)
Glucose-Capillary: 127 mg/dL — ABNORMAL HIGH (ref 70–99)
Glucose-Capillary: 166 mg/dL — ABNORMAL HIGH (ref 70–99)
Glucose-Capillary: 168 mg/dL — ABNORMAL HIGH (ref 70–99)
Glucose-Capillary: 85 mg/dL (ref 70–99)

## 2022-12-09 LAB — MAGNESIUM: Magnesium: 2.2 mg/dL (ref 1.7–2.4)

## 2022-12-09 LAB — BASIC METABOLIC PANEL
Anion gap: 7 (ref 5–15)
BUN: 35 mg/dL — ABNORMAL HIGH (ref 8–23)
CO2: 21 mmol/L — ABNORMAL LOW (ref 22–32)
Calcium: 6.7 mg/dL — ABNORMAL LOW (ref 8.9–10.3)
Chloride: 108 mmol/L (ref 98–111)
Creatinine, Ser: 1.28 mg/dL — ABNORMAL HIGH (ref 0.44–1.00)
GFR, Estimated: 44 mL/min — ABNORMAL LOW (ref >60)
Glucose, Bld: 120 mg/dL — ABNORMAL HIGH (ref 70–99)
Potassium: 3.3 mmol/L — ABNORMAL LOW (ref 3.5–5.1)
Sodium: 136 mmol/L (ref 135–145)

## 2022-12-09 LAB — CBC
HCT: 23.4 % — ABNORMAL LOW (ref 36.0–46.0)
Hemoglobin: 7.7 g/dL — ABNORMAL LOW (ref 12.0–15.0)
MCH: 26.6 pg (ref 26.0–34.0)
MCHC: 32.9 g/dL (ref 30.0–36.0)
MCV: 80.7 fL (ref 80.0–100.0)
Platelets: 218 10*3/uL (ref 150–400)
RBC: 2.9 MIL/uL — ABNORMAL LOW (ref 3.87–5.11)
RDW: 21.3 % — ABNORMAL HIGH (ref 11.5–15.5)
WBC: 11.3 10*3/uL — ABNORMAL HIGH (ref 4.0–10.5)
nRBC: 0 % (ref 0.0–0.2)

## 2022-12-09 MED ORDER — ALBUMIN HUMAN 25 % IV SOLN
12.5000 g | Freq: Once | INTRAVENOUS | Status: AC
  Administered 2022-12-09: 12.5 g via INTRAVENOUS
  Filled 2022-12-09: qty 50

## 2022-12-09 MED ORDER — PREDNISONE 20 MG PO TABS
50.0000 mg | ORAL_TABLET | Freq: Every day | ORAL | Status: DC
  Administered 2022-12-10: 50 mg via ORAL
  Filled 2022-12-09: qty 1

## 2022-12-09 MED ORDER — FUROSEMIDE 10 MG/ML IJ SOLN
40.0000 mg | Freq: Once | INTRAMUSCULAR | Status: AC
  Administered 2022-12-09: 40 mg via INTRAVENOUS
  Filled 2022-12-09: qty 4

## 2022-12-09 MED ORDER — POTASSIUM CHLORIDE CRYS ER 20 MEQ PO TBCR
40.0000 meq | EXTENDED_RELEASE_TABLET | Freq: Once | ORAL | Status: AC
  Administered 2022-12-09: 40 meq via ORAL
  Filled 2022-12-09: qty 2

## 2022-12-09 MED ORDER — POTASSIUM CHLORIDE CRYS ER 20 MEQ PO TBCR
20.0000 meq | EXTENDED_RELEASE_TABLET | Freq: Once | ORAL | Status: AC
  Administered 2022-12-09: 20 meq via ORAL
  Filled 2022-12-09: qty 1

## 2022-12-09 NOTE — Progress Notes (Signed)
   12/09/22   PT Visit Information  Last PT Received On 12/09/22  Assistance Needed +2  History of Present Illness Nancy Hinton is a 77 y.o. F with PMH significant for CAD, atrial fibrillation on Eliquis, HFpEF, Type 2 DM, gastric bypass who presented to the ED 22024by EMS from SNF for nausea and vomiting for one week with inability to tolerate anything by mouth and bilateral leg swelling, tachycardic in Afib RVR with HR in 140's and SBP as low as 83/63.   CXR with small bilateral pleural effusions and possible LLL PNA.  Precautions  Precaution Comments legs weep, non ambulatory x 4 mos, hypotensive  Pain Assessment  Faces Pain Scale 6  Pain Location both legs  Pain Descriptors / Indicators Discomfort  Pain Intervention(s) Monitored during session  Cognition  Arousal/Alertness Awake/alert  Behavior During Therapy Flat affect  Overall Cognitive Status Within Functional Limits for tasks assessed  Bed Mobility  General bed mobility comments bed placed in chair position, patient performed AAROM/exercises using sheet around each foot. did not attempt sitting on bed edge at this time  Transfers  General transfer comment requires mechanical lift  Balance  Overall balance assessment  (unable to determine,)  General Exercises - Lower Extremity  Ankle Circles/Pumps AAROM;20 reps;Both  Short Arc [employer];Both;10 reps  Heel Slides AAROM;20 reps;Both;10 reps  Hip ABduction/ADduction AAROM;Both;10 reps  PT - End of Session  Activity Tolerance Patient tolerated treatment well  Patient left in bed;with call bell/phone within reach  Nurse Communication Need for lift equipment;Mobility status   PT - Assessment/Plan  PT Plan Discharge plan needs to be updated  PT Visit Diagnosis Muscle weakness (generalized) (M62.81);Other symptoms and signs involving the nervous system (R29.898)  PT Frequency (ACUTE ONLY) Min 2X/week  Follow Up Recommendations Skilled nursing-short term rehab (<3 hours/day)   Assistance recommended at discharge Frequent or constant Supervision/Assistance  Patient can return home with the following Two people to help with walking and/or transfers;Two people to help with bathing/dressing/bathroom;Assist for transportation  PT equipment None recommended by PT  AM-PAC PT "6 Clicks" Mobility Outcome Measure (Version 2)  Help needed turning from your back to your side while in a flat bed without using bedrails? 1  Help needed moving from lying on your back to sitting on the side of a flat bed without using bedrails? 1  Help needed moving to and from a bed to a chair (including a wheelchair)? 1  Help needed standing up from a chair using your arms (e.g., wheelchair or bedside chair)? 1  Help needed to walk in hospital room? 1  6 Click Score 5  Consider Recommendation of Discharge To: CIR/SNF/LTACH  Progressive Mobility  What is the highest level of mobility based on the progressive mobility assessment? Level 1 (Bedfast) - Unable to balance while sitting on edge of bed  Mobility Referral No  PT Goal Progression  Progress towards PT goals Progressing toward goals  PT Time Calculation  PT Start Time (ACUTE ONLY) 1030  PT Stop Time (ACUTE ONLY) 1105  PT Time Calculation (min) (ACUTE ONLY) 35 min  PT General Charges  $$ ACUTE PT VISIT 1 Visit  PT Treatments  $Therapeutic Exercise 8-22 mins  $Therapeutic Activity 8-22 mins  [place] Office [phone] Weekend pager-[phone]

## 2022-12-09 NOTE — Progress Notes (Signed)
PROGRESS NOTE    Alka Guttierrez  [id number] DOB: 1946/06/01 DOA: 11/15/2022 PCP: Jamie Kato    Brief Narrative:   77  years old female with past medical history of type 2 diabetes, hypertension, gastric bypass in 2014 presented to hospital with nausea vomiting and leg swelling.  In the ED, patient was noted to have atrial fibrillation with RVR and there was some concern for sepsis with hypotension.  Critical care team was consulted and patient was admitted to the ICU secondary to hypotension.  Patient was put on stress dose steroids, and midodrine was started.  Patient was initially on amiodarone drip in the ICU which was changed to oral and patient was subsequently considered stable for transfer out of the ICU.  Assessment and plan.  Paroxysmal atrial fibrillation/HFpEF Continue amiodarone and Eliquis.  Antihypertensives currently on hold.  Has been started on midodrine.  Rate controlled at this time.  Chronic hypotension/Baseline orthostatic hypotension   Has been weaned off Levophed.   On high-dose of midodrine and hydrocortisone every 6 hourly.  Will receive 1 dose of IV albumin and Lasix today.   Healthcare associated pneumonia. Completed course of antibiotic.  on room air.  Mild leukocytosis but on steroids.   Hyponatremia Mild.  Improved.  Hypokalemia Potassium low at 3.3 today.  Will replenish.  Check levels in AM.  Hypophosphatemia Replenished.  Latest magnesium of 2.0.  Latest phosphorus of 2.7.    Hypothyroidism Continue synthroid   DM2  Sliding scale insulin.  On regular diet as per patient wishes.  Overall controlled.  Continue to monitor closely.   Anemia of chronic disease,  Severe protein calorie malnutrition s/p gastric bypass No obvious bleeding.  Latest hemoglobin of 7.7 from 7.6..  Continue iron supplementation.  Latest albumin was 2.1.  Severe deconditioning Physical therapy has recommended skilled nursing facility  placement/long-term care..   Anxiety Continue clonazepam prn  Generalized lower extremity edema/anasarca background of lymphedema..  Likely secondary to hypoalbuminemia.  Patient is positive balance for 811 mL so far.  Received albumin infusion followed by IV Lasix yesterday.  Will repeat 1 more time today.  Spoke about increasing oral intake protein supplements, on  Ensure supplement.  Patient was on Lasix 40 daily at home.  Consider restarting torsemide from tomorrow.  Low mood better today.  Was able to talk with chaplain yesterday.  Offered psychiatry help but she did not want to talk to a psychiatrist at this time.  Difficult IV access.  Will discontinue central line after IV albumin and Lasix today due to prolonged duration.     DVT prophylaxis: SCDs Start: 11/29/2022 2326 apixaban (ELIQUIS) tablet 5 mg   Code Status:     Code Status: Full Code  Disposition: Skilled nursing facility likely as per PT recommendation.   Status is: Inpatient  Remains inpatient appropriate because: Deconditioning, debility,  anasarca, poor oral intake need for albumin, awaiting for skilled nursing facility..   Family Communication:  Spoke with the patient's cousin [name], health care power of attorney and updated him about the clinical condition of the patient.    Consultants:  PCCM  Procedures:  None  Antimicrobials:  None currently.  Completed Rocephin and Zithromax  Subjective: Today, patient was seen and examined at bedside.  Appears to be more composed today.  Complains of swelling over her lower extremities and difficulty ambulation.    Objective: Vitals:   12/09/22 0745 12/09/22 0800 12/09/22 0900 12/09/22 1000  BP:  101/61 90/65 112/69  Pulse: (!) 54 (!) 56 74 70  Resp: (!) 8 (!) '7 14 15  '$ Temp:      TempSrc:      SpO2: 100% 99% 91% 92%  Weight:      Height:        Intake/Output Summary (Last 24 hours) at 12/09/2022 1123 Last data filed at 12/08/2022 2151 Gross per 24 hour   Intake --  Output 500 ml  Net -500 ml    Filed Weights   12/07/22 0500 12/08/22 0500 12/09/22 0500  Weight: 107.7 kg 108.6 kg 108.5 kg    Physical Examination: Body mass index is 42.37 kg/m.   General: Alert awake and deconditioned.  Not in obvious distress.  Obese HENT:   Pallor noted, oral mucosa is moist. Chest:  Clear breath sounds.  Diminished breath sounds bilaterally. No crackles or wheezes.  CVS: S1 &S2 heard. No murmur.  Regular rate and rhythm. Abdomen: Soft, nontender, nondistended.  Bowel sounds are heard.   Extremities: No cyanosis, bilateral lower extremity lymphedema, anasarca noted.  Edema over the upper extremities as well. Psych: Alert, awake and oriented, flat affect, CNS:  No cranial nerve deficits.  Generalized weakness of extremities. Skin: Warm and dry.  Bilateral lower extremity edema, lymphedema.  Data Reviewed:   CBC: Recent Labs  Lab 12/05/22 0440 12/06/22 0414 12/07/22 0505 12/08/22 0400 12/09/22 0448  WBC 11.3* 10.1 12.9* 12.0* 11.3*  HGB 8.3* 7.5* 7.6* 7.5* 7.7*  HCT 24.4* 22.5* 22.9* 22.8* 23.4*  MCV 77.0* 78.7* 79.2* 80.3 80.7  PLT 216 204 217 205 218     Basic Metabolic Panel: Recent Labs  Lab 12/03/22 1659 12/04/22 0455 12/05/22 0440 12/06/22 0414 12/07/22 0505 12/08/22 0400 12/09/22 0448  NA 135 132* 134* 135 136 134* 136  K 3.4* 3.1* 4.0 3.9 3.8 3.8 3.3*  CL 103 106 105 108 107 109 108  CO2 21* 21* 21* 21* 21* 21* 21*  GLUCOSE 174* 117* 114* 106* 108* 96 120*  BUN '22 21 21 '$ 24* 31* 36* 35*  CREATININE 0.99 0.87 0.91 0.95 1.13* 1.09* 1.28*  CALCIUM 6.8* 6.7* 6.8* 6.7* 6.8* 6.5* 6.7*  MG 2.1 2.1 2.1 2.0  --   --  2.2  PHOS  --  2.1* 2.7  --   --   --   --      Liver Function Tests: Recent Labs  Lab 12/07/22 0505  AST 49*  ALT 45*  ALKPHOS 108  BILITOT 0.6  PROT 4.0*  ALBUMIN 2.1*      Radiology Studies: No results found.    LOS: 9 days   Flora Lipps, MD [facility] Available via Epic  secure chat 7am-7pm After these hours, please refer to coverage provider listed on [email or web address] 12/09/2022, 11:23 AM

## 2022-12-10 ENCOUNTER — Inpatient Hospital Stay (HOSPITAL_COMMUNITY): Payer: Medicare HMO

## 2022-12-10 DIAGNOSIS — R609 Edema, unspecified: Secondary | ICD-10-CM | POA: Diagnosis not present

## 2022-12-10 DIAGNOSIS — E119 Type 2 diabetes mellitus without complications: Secondary | ICD-10-CM | POA: Diagnosis not present

## 2022-12-10 DIAGNOSIS — Z9884 Bariatric surgery status: Secondary | ICD-10-CM | POA: Diagnosis not present

## 2022-12-10 DIAGNOSIS — I10 Essential (primary) hypertension: Secondary | ICD-10-CM | POA: Diagnosis not present

## 2022-12-10 DIAGNOSIS — F419 Anxiety disorder, unspecified: Secondary | ICD-10-CM | POA: Diagnosis not present

## 2022-12-10 DIAGNOSIS — R601 Generalized edema: Secondary | ICD-10-CM | POA: Diagnosis not present

## 2022-12-10 LAB — GLUCOSE, CAPILLARY
Glucose-Capillary: 104 mg/dL — ABNORMAL HIGH (ref 70–99)
Glucose-Capillary: 129 mg/dL — ABNORMAL HIGH (ref 70–99)
Glucose-Capillary: 158 mg/dL — ABNORMAL HIGH (ref 70–99)
Glucose-Capillary: 172 mg/dL — ABNORMAL HIGH (ref 70–99)
Glucose-Capillary: 188 mg/dL — ABNORMAL HIGH (ref 70–99)
Glucose-Capillary: 82 mg/dL (ref 70–99)

## 2022-12-10 LAB — CBC
HCT: 23.4 % — ABNORMAL LOW (ref 36.0–46.0)
Hemoglobin: 7.6 g/dL — ABNORMAL LOW (ref 12.0–15.0)
MCH: 26.7 pg (ref 26.0–34.0)
MCHC: 32.5 g/dL (ref 30.0–36.0)
MCV: 82.1 fL (ref 80.0–100.0)
Platelets: 227 10*3/uL (ref 150–400)
RBC: 2.85 MIL/uL — ABNORMAL LOW (ref 3.87–5.11)
RDW: 21.9 % — ABNORMAL HIGH (ref 11.5–15.5)
WBC: 11.4 10*3/uL — ABNORMAL HIGH (ref 4.0–10.5)
nRBC: 0.2 % (ref 0.0–0.2)

## 2022-12-10 LAB — BASIC METABOLIC PANEL
Anion gap: 7 (ref 5–15)
BUN: 37 mg/dL — ABNORMAL HIGH (ref 8–23)
CO2: 20 mmol/L — ABNORMAL LOW (ref 22–32)
Calcium: 6.7 mg/dL — ABNORMAL LOW (ref 8.9–10.3)
Chloride: 108 mmol/L (ref 98–111)
Creatinine, Ser: 1.16 mg/dL — ABNORMAL HIGH (ref 0.44–1.00)
GFR, Estimated: 49 mL/min — ABNORMAL LOW (ref >60)
Glucose, Bld: 88 mg/dL (ref 70–99)
Potassium: 3.3 mmol/L — ABNORMAL LOW (ref 3.5–5.1)
Sodium: 135 mmol/L (ref 135–145)

## 2022-12-10 LAB — MAGNESIUM: Magnesium: 2.3 mg/dL (ref 1.7–2.4)

## 2022-12-10 MED ORDER — PREDNISONE 20 MG PO TABS
40.0000 mg | ORAL_TABLET | Freq: Every day | ORAL | Status: DC
  Administered 2022-12-11 – 2022-12-14 (×4): 40 mg via ORAL
  Filled 2022-12-10 (×4): qty 2

## 2022-12-10 MED ORDER — GABAPENTIN 300 MG PO CAPS: 300.0000 mg | ORAL_CAPSULE | Freq: Three times a day (TID) | ORAL | Status: DC

## 2022-12-10 MED ORDER — GABAPENTIN 100 MG PO CAPS: 200.0000 mg | ORAL_CAPSULE | Freq: Three times a day (TID) | ORAL | Status: DC

## 2022-12-10 MED ORDER — POTASSIUM CHLORIDE CRYS ER 20 MEQ PO TBCR
40.0000 meq | EXTENDED_RELEASE_TABLET | Freq: Once | ORAL | Status: AC
  Administered 2022-12-10: 40 meq via ORAL
  Filled 2022-12-10: qty 2

## 2022-12-10 MED ORDER — ONDANSETRON 4 MG PO TBDP
4.0000 mg | ORAL_TABLET | Freq: Three times a day (TID) | ORAL | Status: DC | PRN
  Administered 2022-12-10: 4 mg via ORAL
  Filled 2022-12-10 (×2): qty 1

## 2022-12-10 MED ORDER — ONDANSETRON HCL 4 MG/2ML IJ SOLN: 4.0000 mg | Freq: Four times a day (QID) | INTRAMUSCULAR | Status: DC | PRN

## 2022-12-10 MED ORDER — HYDROXYZINE HCL 25 MG PO TABS
25.0000 mg | ORAL_TABLET | Freq: Three times a day (TID) | ORAL | Status: DC
Start: 2022-12-10 — End: 2022-12-16
  Administered 2022-12-10 – 2022-12-15 (×17): 25 mg via ORAL
  Filled 2022-12-10 (×17): qty 1

## 2022-12-10 MED ORDER — TORSEMIDE 20 MG PO TABS
20.0000 mg | ORAL_TABLET | Freq: Every day | ORAL | Status: DC
  Administered 2022-12-10 – 2022-12-13 (×4): 20 mg via ORAL
  Filled 2022-12-10 (×4): qty 1

## 2022-12-10 MED ORDER — GABAPENTIN 250 MG/5ML PO SOLN
200.0000 mg | Freq: Three times a day (TID) | ORAL | Status: DC
  Administered 2022-12-10 – 2022-12-15 (×17): 200 mg via ORAL
  Filled 2022-12-10 (×24): qty 4

## 2022-12-10 NOTE — NC FL2 (Signed)
[place] LEVEL OF CARE FORM     IDENTIFICATION  Patient Name: Nancy Hinton Birthdate: 03-20-1946 Sex: female Admission Date (Current Location): 11/14/2022  [facility] [place] Number:  Herbalist and Address:  [facility],  [place], [place]      Provider Number: [id number]  Attending Physician Name and Address:  Flora Lipps, MD  Relative Name and Phone Number:  HCPOA: Philmore Pali (cousin) [phone]    Current Level of Care: Hospital Recommended Level of Care: [place] Prior Approval Number:    Date Approved/Denied:   PASRR Number: [id number]  Discharge Plan: SNF    Current Diagnoses: Patient Active Problem List   Diagnosis Date Noted   Anasarca 12/01/2022   Pressure injury of skin 12/01/2022   Multifocal atrial tachycardia 10/23/2022   Diarrhea 10/20/2022   Hypoalbuminemia 10/20/2022   Atrial fibrillation ([place]) 08/14/2022   Heart failure with preserved ejection fraction ([place]) 08/14/2022   Orthostatic hypotension 08/14/2022   Lymphedema 07/27/2022   Type 2 diabetes mellitus without complication, without long-term current use of insulin ([place]) 12/16/2018   Iron deficiency anemia secondary to inadequate dietary iron intake 02/04/2018   Osteoporosis 12/20/2015   Vitamin D deficiency 10/08/2015   Thyroid activity decreased 10/08/2015   Arthritis 10/08/2015   H/O gastric bypass 10/08/2015   Anemia, iron deficiency 12/21/2014   Essential hypertension 12/21/2014    Orientation RESPIRATION BLADDER Height & Weight     Time, Situation, Self, Place  Normal Continent Weight: 108.5 kg Height:  '5\' 3"'$  (160 cm)  BEHAVIORAL SYMPTOMS/MOOD NEUROLOGICAL BOWEL NUTRITION STATUS      Continent Diet (carb modified, thin fluids)  AMBULATORY STATUS COMMUNICATION OF NEEDS Skin   Extensive Assist Verbally PU Stage and Appropriate Care   PU Stage 2 Dressing: Daily (Topical care to the open areas to  cleanse, cover with antimicrobial nonadherent gauze (xeroform)cover dry gauze followed by silicone foam dressings antimicrobial moisture wicking textile (InterDry) is to be used in the intertriginous areas of dermatitis.)                   Personal Care Assistance Level of Assistance  Bathing, Feeding, Dressing Bathing Assistance: Maximum assistance Feeding assistance: Maximum assistance Dressing Assistance: Maximum assistance     Functional Limitations Info  Hearing, Sight, Speech Sight Info: Adequate Hearing Info: Adequate Speech Info: Adequate    SPECIAL CARE FACTORS FREQUENCY  PT (By licensed PT), OT (By licensed OT)     PT Frequency: 5x per week OT Frequency: 5x per week            Contractures Contractures Info: Not present    Additional Factors Info  Code Status, Allergies Code Status Info: Full Allergies Info: Codeine           Current Medications (12/10/2022):  This is the current hospital active medication list Current Facility-Administered Medications  Medication Dose Route Frequency Provider Last Rate Last Admin   0.9 %  sodium chloride infusion   Intra-arterial PRN Sherrilyn Rist A, MD       [START ON 12/11/2022] amiodarone (PACERONE) tablet 200 mg  200 mg Oral Daily Erick Colace, NP       apixaban Arne Cleveland) tablet 5 mg  5 mg Oral BID Emiliano Dyer, RPH   5 mg at 12/10/22 0932   Chlorhexidine Gluconate Cloth 2 % PADS 6 each  6 each Topical Q0600 Kipp Brood, MD   6 each at 12/09/22 2258  clonazePAM (KLONOPIN) tablet 0.5 mg  0.5 mg Oral BID PRN Simonne Maffucci B, MD   0.5 mg at 12/10/22 [id number]   feeding supplement (ENSURE ENLIVE / ENSURE PLUS) liquid 237 mL  237 mL Oral BID BM Olalere, Adewale A, MD   237 mL at 12/09/22 1304   ferrous sulfate tablet 325 mg  325 mg Oral Q lunch Erick Colace, NP   325 mg at 12/09/22 1302   gabapentin (NEURONTIN) 250 MG/5ML solution 200 mg  200 mg Oral TID Pokhrel, Laxman, MD   200 mg at 12/10/22 1120    insulin aspart (novoLOG) injection 0-9 Units  0-9 Units Subcutaneous Q4H Olalere, Adewale A, MD   1 Units at 12/10/22 [id number]   levothyroxine (SYNTHROID) tablet 88 mcg  88 mcg Oral Q0600 Erick Colace, NP   88 mcg at 12/10/22 0653   midodrine (PROAMATINE) tablet 15 mg  15 mg Oral TID WC Erick Colace, NP   15 mg at 12/10/22 1120   naphazoline-pheniramine (NAPHCON-A) 0.025-0.3 % ophthalmic solution 1 drop  1 drop Both Eyes QID PRN Olalere, Adewale A, MD   1 drop at 12/01/22 1756   ondansetron (ZOFRAN-ODT) disintegrating tablet 4 mg  4 mg Oral Q8H PRN Pokhrel, Laxman, MD   4 mg at 12/10/22 [id number]   Oral care mouth rinse  15 mL Mouth Rinse PRN Agarwala, Einar Grad, MD       polyethylene glycol (MIRALAX / GLYCOLAX) packet 17 g  17 g Oral Daily PRN Gleason, Otilio Carpen, PA-C       [START ON 12/11/2022] predniSONE (DELTASONE) tablet 40 mg  40 mg Oral Q breakfast Pokhrel, Laxman, MD       simethicone (MYLICON) chewable tablet 160 mg  160 mg Oral QID PRN Erick Colace, NP   160 mg at 12/04/22 2231   traZODone (DESYREL) tablet 25 mg  25 mg Oral QHS Pokhrel, Laxman, MD   25 mg at 12/09/22 2148     Discharge Medications: Please see discharge summary for a list of discharge medications.  Relevant Imaging Results:  Relevant Lab Results:   Additional Information SSN: [id number]  [name], RN

## 2022-12-10 NOTE — Progress Notes (Signed)
Bilateral lower extremity venous duplex has been completed. Preliminary results can be found in CV Proc through chart review.   12/10/22 12:30 PM Nancy Hinton RVT

## 2022-12-10 NOTE — TOC Progression Note (Addendum)
Transition of Care [facility]) - Progression Note    Patient Details  Name: Nancy Hinton MRN: [id number] Date of Birth: 12/07/1945  Transition of Care [facility]) CM/SW [place], RN Phone Number: 12/10/2022, 12:43 PM  Clinical Narrative:   This RNCM notified via secure chat MD wanting to discharge patient today, however no current bed offers as patient is LTC and does not wish to apply for LTC insurance at this time. Patient and HCPOA previously requested that patient return home with family. This RNCM left voicemail for [name], awaiting a return call.  Per PT recommendations CIR, LTACH and SNF. Per chart review patient is not eligible for CIR. This RNCM notified Pamala Hurry with CIR who advised patient is not eligible for CIR. This RNCM notified Raquel Sarna with LTACH to review for possible acceptance, per Raquel Sarna with [facility] patient is not eligible for [facility] as well.   Plan is to discharge home with family, this RNCM awaiting a call back from [facility], as patient does not have insurance for long term care SNF.  TOC will continue to follow.   Expected Discharge Plan: [place] Barriers to Discharge: Continued Medical Work up  Expected Discharge Plan and Services In-house Referral: NA Discharge Planning Services: CM Consult   Living arrangements for the past 2 months: [place]                 DME Arranged: N/A DME Agency: NA       HH Arranged: NA [place] Agency: NA         Social Determinants of Health (SDOH) Interventions SDOH Screenings   Food Insecurity: No Food Insecurity (12/01/2022)  Housing: Low Risk  (12/01/2022)  Transportation Needs: No Transportation Needs (12/01/2022)  Utilities: Not At Risk (12/01/2022)  Tobacco Use: Low Risk  (11/20/2022)    Readmission Risk Interventions    12/08/2022    1:26 PM  Readmission Risk Prevention Plan  Transportation Screening Complete  PCP or Specialist Appt within 5-7 Days Complete  Home Care  Screening Complete  Medication Review (RN CM) Complete

## 2022-12-10 NOTE — Progress Notes (Signed)
  Inpatient Rehab Admissions Coordinator :  Per request TOC CM, patient was screened for CIR candidacy by Danne Baxter RN MSN. Patient is not at a level to tolerate the intensity required to pursue a CIR admit due. We would recommend other rehab venues to be pursued. Please contact me with any questions.  Danne Baxter RN MSN Admissions Coordinator [phone]

## 2022-12-10 NOTE — Progress Notes (Addendum)
PROGRESS NOTE    Nancy Hinton  [id number] DOB: May 18, 1946 DOA: 11/23/2022 PCP: Jamie Kato    Brief Narrative:   77  years old female with past medical history of type 2 diabetes, hypertension, gastric bypass in 2014 presented to hospital with nausea vomiting and leg swelling.  In the ED, patient was noted to have atrial fibrillation with RVR and there was some concern for sepsis with hypotension.  Critical care team was consulted and patient was admitted to the ICU secondary to hypotension.  Patient was put on stress dose steroids, and midodrine was started.  Patient was initially on amiodarone drip in the ICU which was changed to oral and patient was subsequently considered stable for transfer out of the ICU.  During hospitalization, patient continues to have lower leg swelling and pain and continues to feel weak and deconditioned.  Patient complains of severe lower extremity pain today.  See has not been doing much of the physical therapy but has not been accepted for CIR or LTAC.  Family wishing discharge home but patient feels not ready yet. TOC is involved.  Assessment and plan.  Paroxysmal atrial fibrillation/HFpEF Continue amiodarone and Eliquis.  Antihypertensives currently on hold.  Has been started on midodrine.  Rate controlled at this time.  Bilateral lower extremity excruciating pain, swelling.  States that it has been hurting a lot today.  Ultrasound of the lower extremities was negative for DVT.  Will start the patient on Neurontin 200 mg 3 times daily for now.  Appears like neuropathic pain.  Uncertain how much of this is connected to underlying stress, anxiety so have communicated with psychiatry team for further assessment.  Will start torsemide 20 milligrams daily for leg swelling as tolerated by blood pressure..  Chronic hypotension/Baseline orthostatic hypotension   Has been weaned off Levophed.   On high-dose of midodrine and hydrocortisone every  6 hourly.  Hydrocortisone has been changed to prednisone orally at this time.  Will need to continue to taper as tolerated.    Healthcare associated pneumonia. Completed course of antibiotic.  on room air.  Mild leukocytosis but on steroids.   Hyponatremia Improved.  Latest sodium of 135  Hypokalemia Potassium of 3.3 today.  Will continue to replace orally.  Check BMP in AM.  Hypophosphatemia Replenished.  Latest magnesium of 2.0.  Latest phosphorus of 2.7.    Hypothyroidism Continue synthroid   Diabetes mellitus type 2. Continue sliding scale insulin.  On regular diet as per patient wishes.  Overall controlled.  Continue to monitor closely.   Anemia of chronic disease,  Severe protein calorie malnutrition s/p gastric bypass No obvious bleeding.  Latest hemoglobin of 7.6.  Continue iron supplementation.  Latest albumin was 2.1.  Severe deconditioning Physical therapy has recommended skilled nursing facility placement/long-term care but it looks like a plan is home with her family.  TOC actively looking at it.   Anxiety Continue clonazepam prn.  Psychiatry has been consulted  Generalized lower extremity edema/anasarca background of lymphedema.  Likely secondary to hypoalbuminemia.  Patient is positive balance for 1291 mL but unsure how accurate it is.  Patient received IV albumin followed by IV Lasix twice during hospitalization.  Spoke about increasing oral intake protein supplements, on  Ensure supplement.  Patient was on Lasix 40 daily at home.  Consider restarting torsemide from today.  Depressed mood, tearfulness.  At this time and patient seems to benefit from further evaluation by psychiatry to assess her underlying mental situation.  Will consult psychiatry.  Difficult IV access.  Central line was discontinued yesterday due to prolonged usage.    DVT prophylaxis: SCDs Start: 11/22/2022 2326 apixaban (ELIQUIS) tablet 5 mg   Code Status:     Code Status: Full  Code  Disposition: Skilled nursing facility likely as per PT recommendation but TOC has communicated with POA and as I understand plan is disposition home.  Patient does not qualify for CIR or LTAC.  Communicated with TOC .   Status is: Inpatient  Remains inpatient appropriate because: Deconditioning, debility,  anasarca, waiting for safe disposition.   Family Communication:  Spoke with the patient's cousin [name], health care power of attorney and updated him about the clinical condition of the patient on 12/09/2022.  Consultants:  PCCM Psychiatry.  Procedures:  Venous Doppler of the lower extremities. Central line placement and removal.  Antimicrobials:  None currently.  Completed Rocephin and Zithromax  Subjective: Today, patient was seen and examined at bedside.  Patient complains of soft shooting pain on the lower extremities bilaterally.  Appears in depressed mood tearful and crying and states that she has not been able to do walk or do anything.  Objective: Vitals:   12/10/22 0832 12/10/22 1000 12/10/22 1200 12/10/22 1210  BP: (!) 89/62 (!) 120/52 131/66   Pulse: (!) 54 64 62   Resp: '13 10 13   '$ Temp:    97.6 F (36.4 C)  TempSrc:    Axillary  SpO2: 93% 96% 99%   Weight:      Height:        Intake/Output Summary (Last 24 hours) at 12/10/2022 1417 Last data filed at 12/10/2022 1200 Gross per 24 hour  Intake 480 ml  Output --  Net 480 ml    Filed Weights   12/08/22 0500 12/09/22 0500 12/10/22 0630  Weight: 108.6 kg 108.5 kg 108.5 kg    Physical Examination: Body mass index is 42.37 kg/m.   General: Alert awake and deconditioned.  Tearful and depressed, not in obvious distress, HENT:   Appears pale. Chest:  Clear breath sounds.  Diminished breath sounds bilaterally. No crackles or wheezes.  CVS: S1 &S2 heard. No murmur.  Regular rate and rhythm. Abdomen: Soft, nontender, nondistended.  Bowel sounds are heard.   Extremities: No cyanosis, anasarca with  bilateral lower extremity lymphedema.  Edema over the upper extremities as well.  Tenderness on palpation of bilateral lower extremities without any change from yesterday. Psych: Alert, awake and oriented, flat affect, CNS:  No cranial nerve deficits.  Generalized weakness of extremities. Skin: Warm and dry.  Bilateral lower extremity edema, lymphedema.    Data Reviewed:   CBC: Recent Labs  Lab 12/06/22 0414 12/07/22 0505 12/08/22 0400 12/09/22 0448 12/10/22 0723  WBC 10.1 12.9* 12.0* 11.3* 11.4*  HGB 7.5* 7.6* 7.5* 7.7* 7.6*  HCT 22.5* 22.9* 22.8* 23.4* 23.4*  MCV 78.7* 79.2* 80.3 80.7 82.1  PLT 204 217 205 218 227     Basic Metabolic Panel: Recent Labs  Lab 12/04/22 0455 12/05/22 0440 12/06/22 0414 12/07/22 0505 12/08/22 0400 12/09/22 0448 12/10/22 0723  NA 132* 134* 135 136 134* 136 135  K 3.1* 4.0 3.9 3.8 3.8 3.3* 3.3*  CL 106 105 108 107 109 108 108  CO2 21* 21* 21* 21* 21* 21* 20*  GLUCOSE 117* 114* 106* 108* 96 120* 88  BUN 21 21 24* 31* 36* 35* 37*  CREATININE 0.87 0.91 0.95 1.13* 1.09* 1.28* 1.16*  CALCIUM 6.7* 6.8* 6.7* 6.8* 6.5*  6.7* 6.7*  MG 2.1 2.1 2.0  --   --  2.2 2.3  PHOS 2.1* 2.7  --   --   --   --   --      Liver Function Tests: Recent Labs  Lab 12/07/22 0505  AST 49*  ALT 45*  ALKPHOS 108  BILITOT 0.6  PROT 4.0*  ALBUMIN 2.1*      Radiology Studies: VAS [place] LOWER EXTREMITY VENOUS (DVT)  Result Date: 12/10/2022  Lower Venous DVT Study Patient Name:  TIFFANYAMBER SZYMANSKI Laver  Date of Exam:   12/10/2022 Medical Rec #: [id number]              Accession #:    [id number] Date of Birth: [birth year]              Patient Gender: F Patient Age:   59 years Exam Location:  [facility] Procedure:      VAS [place] LOWER EXTREMITY VENOUS (DVT) Referring Phys: Corrie Mckusick Doyle Tegethoff --------------------------------------------------------------------------------  Indications: Edema.  Risk Factors: None identified. Limitations: Body habitus, poor  ultrasound/tissue interface, patient positioning, patient immobility, patient pain tolerance, bandages and open wound. Comparison Study: No prior studies. Performing Technologist: Oliver Hum RVT  Examination Guidelines: A complete evaluation includes B-mode imaging, spectral Doppler, color Doppler, and power Doppler as needed of all accessible portions of each vessel. Bilateral testing is considered an integral part of a complete examination. Limited examinations for reoccurring indications may be performed as noted. The reflux portion of the exam is performed with the patient in reverse Trendelenburg.  +---------+---------------+---------+-----------+----------+-------------------+ RIGHT    CompressibilityPhasicitySpontaneityPropertiesThrombus Aging      +---------+---------------+---------+-----------+----------+-------------------+ CFV                     Yes      Yes                  Patency shown with                                                        color doppler       +---------+---------------+---------+-----------+----------+-------------------+ SFJ                                                   Not well visualized +---------+---------------+---------+-----------+----------+-------------------+ FV Prox  Full                                                             +---------+---------------+---------+-----------+----------+-------------------+ FV Mid                  Yes      Yes                                      +---------+---------------+---------+-----------+----------+-------------------+ FV Distal               Yes  Yes                                      +---------+---------------+---------+-----------+----------+-------------------+ PFV                                                   Not well visualized +---------+---------------+---------+-----------+----------+-------------------+ POP      Full           Yes       Yes                                      +---------+---------------+---------+-----------+----------+-------------------+ PTV                                                   Not well visualized +---------+---------------+---------+-----------+----------+-------------------+ PERO                                                  Not well visualized +---------+---------------+---------+-----------+----------+-------------------+   +---------+---------------+---------+-----------+----------+-------------------+ LEFT     CompressibilityPhasicitySpontaneityPropertiesThrombus Aging      +---------+---------------+---------+-----------+----------+-------------------+ CFV      Full           Yes      Yes                                      +---------+---------------+---------+-----------+----------+-------------------+ SFJ      Full                                                             +---------+---------------+---------+-----------+----------+-------------------+ FV Prox                 Yes      Yes                                      +---------+---------------+---------+-----------+----------+-------------------+ FV Mid                  Yes      Yes                                      +---------+---------------+---------+-----------+----------+-------------------+ FV Distal               Yes      Yes                                      +---------+---------------+---------+-----------+----------+-------------------+  PFV                                                   Not well visualized +---------+---------------+---------+-----------+----------+-------------------+ POP      Full           Yes      Yes                                      +---------+---------------+---------+-----------+----------+-------------------+ PTV                                                   Not well visualized  +---------+---------------+---------+-----------+----------+-------------------+ PERO                                                  Not well visualized +---------+---------------+---------+-----------+----------+-------------------+    Summary: RIGHT: - There is no evidence of deep vein thrombosis in the lower extremity. However, portions of this examination were limited- see technologist comments above.  - No cystic structure found in the popliteal fossa.  LEFT: - There is no evidence of deep vein thrombosis in the lower extremity. However, portions of this examination were limited- see technologist comments above.  - No cystic structure found in the popliteal fossa.  *See table(s) above for measurements and observations.    Preliminary       LOS: 10 days   Flora Lipps, MD [facility] Available via Epic secure chat 7am-7pm After these hours, please refer to coverage provider listed on [email or web address] 12/10/2022, 2:17 PM

## 2022-12-10 NOTE — Consult Note (Addendum)
[facility] Consult   Reason for Consult:  Severe depression mood, limiting ambulation mobility.  Referring Physician:  Dr. Louanne Belton Patient Identification: Nancy Hinton MRN:  [id number] Principal Diagnosis: Orthostatic hypotension Diagnosis:  Principal Problem:   Orthostatic hypotension Active Problems:   Essential hypertension   H/O gastric bypass   Atrial fibrillation (HCC)   Lymphedema   Type 2 diabetes mellitus without complication, without long-term current use of insulin (HCC)   Anasarca   Pressure injury of skin   Total Time spent with patient: 1 hour  Subjective:   Nancy Hinton is a 77 y.o. female patient admitted with n/v and leg swelling.  Currently on interview, the patient. Alert and oriented x4.  Over the past couple months she reports worsening anxiety. She reports symptoms of depression including loneliness, hopeless, low energy,  isolative, crying spells deterioration of ADLs.  She reports that her symptoms have increased due to multiple hospitalizations and the doctors unable to determine what is causing the swelling and leaking.   Patient does not appear acutely manic on exam and she does not elicit or endorse any current psychotic symptoms. Patient currently denies SI/HI/AVH.  Pt remains at low risk of suicide without the need for inpatient psychiatric admission and treatment.   Patient is able to identify any current stressors that are leading to her tearfulness and increased anxiety to include multiple hospitalizations, grandchildren returned to live with their father, unable to live in new home, deconditioning.  Patient does endorse wanting help in the home, as she is aware she will need to build her strength up in order to return home.  Patient does endorse isolation and loneliness as one of her stressors, in addition to physical pain. "My legs didn't hurt. They used to be tight and no pain. Now they are tight and painful.  She feels  as though the medications are working (Gabapentin).  She seems motivated to outpatient resources, and becoming more active in her treatment plan and goals. " I look forward to going home and drinking that cup of coffee every morning. "    Patient denies any current sleeping or eating disturbances.  As noted above she is interested in making changes to become a better person, she just lacks resources.  Patient identifies several barriers to include weight, decrease mobility, and physical pain.  She does not live locally, resides alone with her (2) grandchildren. Denies any history of and or current use of illicit substances, alcohol, and or nicotine.  Patient is currently unemployed, and is receiving Social Security disability income.  Patient assessed by nurse practitioner.  Patient pleasant cooperative during assessment.  Patient continues to become very tearful throughout the assessment and while on the phone speaking with her loved one. Patient verbally contracts for safety with this Probation officer, and continues to deny suicidality. Patient denies homicidal ideations.  Patient denies auditory and visual hallucinations and there is no evidence of delusional thought content.  Patient does not appear to be responding to internal stimuli and denies symptoms of paranoia.  Patient offered support and encouragement.    HPI:  77 years old female with past medical history of type 2 diabetes, hypertension, gastric bypass in 2014 presented to hospital with nausea vomiting and leg swelling. In the ED, patient was noted to have atrial fibrillation with RVR and there was some concern for sepsis with hypotension. Critical care team was consulted and patient was admitted to the ICU secondary to hypotension. Patient was put on stress  dose steroids, and midodrine was started. Patient was initially on amiodarone drip in the ICU which was changed to oral and patient was subsequently considered stable for transfer out of the ICU.    Past Psychiatric History: Anxiety, has been taking Klonopin 0.'25mg'$  po BID for anxiety. Pt denies ever been hospitalized for mental health concerns in the past. Denies any previous history of suicidal thoughts, suicidal ideations, and or non suicidal self injurious behaviors. Pt denies history of aggression, agitation, violent behavior, and or history of homicidal ideations/thoughts.  Patient further denies any current, previous legal charges.  Patient further denies access to guns, weapons, or any engagement with the legal system.  Patient denies history of illicit substances to include synthetic substances, any cannabidiol, supplemental herbs.    Risk to Self:   Denies Risk to Others:   Denies Prior Inpatient Therapy:   Denies  Prior Outpatient Therapy:  Denies  Past Medical History:  Past Medical History:  Diagnosis Date   Arthritis    Diabetes mellitus without complication ([place])    history of DM prior to gastric bypass, she has not had to be on medicine since 170 lb weightloss.   Hypertension     Past Surgical History:  Procedure Laterality Date   COLON RESECTION     GASTRIC BYPASS     TONSILLECTOMY     Family History:  Family History  Problem Relation Age of Onset   Alzheimer's disease Mother    Heart disease Father    Heart attack Maternal Grandmother    Breast cancer Maternal Grandmother    Family Psychiatric  History:  Social History:  Social History   Substance and Sexual Activity  Alcohol Use No     Social History   Substance and Sexual Activity  Drug Use No    Social History   Socioeconomic History   Marital status: Divorced    Spouse name: Not on file   Number of children: Not on file   Years of education: Not on file   Highest education level: Not on file  Occupational History   Not on file  Tobacco Use   Smoking status: Never   Smokeless tobacco: Never  Substance and Sexual Activity   Alcohol use: No   Drug use: No   Sexual activity: Never   Other Topics Concern   Not on file  Social History Narrative   Not on file   Social Determinants of Health   Financial Resource Strain: Not on file  Food Insecurity: No Food Insecurity (12/01/2022)   Hunger Vital Sign    Worried About Running Out of Food in the Last Year: Never true    Ran Out of Food in the Last Year: Never true  Transportation Needs: No Transportation Needs (12/01/2022)   PRAPARE - Hydrologist (Medical): No    Lack of Transportation (Non-Medical): No  Physical Activity: Not on file  Stress: Not on file  Social Connections: Not on file   Additional Social History:    Allergies:   Allergies  Allergen Reactions   Codeine Nausea Only    Labs:  Results for orders placed or performed during the hospital encounter of 11/21/2022 (from the past 48 hour(s))  Glucose, capillary     Status: Abnormal   Collection Time: 12/08/22  4:13 PM  Result Value Ref Range   Glucose-Capillary 144 (H) 70 - 99 mg/dL    Comment: Glucose reference range applies only to samples taken after fasting  for at least 8 hours.   Comment 1 Notify RN    Comment 2 Document in Chart   Glucose, capillary     Status: Abnormal   Collection Time: 12/08/22  8:35 PM  Result Value Ref Range   Glucose-Capillary 148 (H) 70 - 99 mg/dL    Comment: Glucose reference range applies only to samples taken after fasting for at least 8 hours.   Comment 1 Notify RN    Comment 2 Document in Chart   Glucose, capillary     Status: Abnormal   Collection Time: 12/09/22 12:15 AM  Result Value Ref Range   Glucose-Capillary 125 (H) 70 - 99 mg/dL    Comment: Glucose reference range applies only to samples taken after fasting for at least 8 hours.   Comment 1 Notify RN    Comment 2 Document in Chart   Glucose, capillary     Status: Abnormal   Collection Time: 12/09/22  4:42 AM  Result Value Ref Range   Glucose-Capillary 125 (H) 70 - 99 mg/dL    Comment: Glucose reference range applies  only to samples taken after fasting for at least 8 hours.   Comment 1 Notify RN    Comment 2 Document in Chart   Basic metabolic panel     Status: Abnormal   Collection Time: 12/09/22  4:48 AM  Result Value Ref Range   Sodium 136 135 - 145 mmol/L   Potassium 3.3 (L) 3.5 - 5.1 mmol/L   Chloride 108 98 - 111 mmol/L   CO2 21 (L) 22 - 32 mmol/L   Glucose, Bld 120 (H) 70 - 99 mg/dL    Comment: Glucose reference range applies only to samples taken after fasting for at least 8 hours.   BUN 35 (H) 8 - 23 mg/dL   Creatinine, Ser 1.28 (H) 0.44 - 1.00 mg/dL   Calcium 6.7 (L) 8.9 - 10.3 mg/dL   GFR, Estimated 44 (L) >60 mL/min    Comment: (NOTE) Calculated using the CKD-EPI Creatinine Equation (2021)    Anion gap 7 5 - 15    Comment: Performed at [facility], [place] [address], [place], [place] [address]  CBC     Status: Abnormal   Collection Time: 12/09/22  4:48 AM  Result Value Ref Range   WBC 11.3 (H) 4.0 - 10.5 K/uL   RBC 2.90 (L) 3.87 - 5.11 MIL/uL   Hemoglobin 7.7 (L) 12.0 - 15.0 g/dL   HCT 23.4 (L) 36.0 - 46.0 %   MCV 80.7 80.0 - 100.0 fL   MCH 26.6 26.0 - 34.0 pg   MCHC 32.9 30.0 - 36.0 g/dL   RDW 21.3 (H) 11.5 - 15.5 %   Platelets 218 150 - 400 K/uL   nRBC 0.0 0.0 - 0.2 %    Comment: Performed at [facility], [place] [address] [place], [place] [address]  Magnesium     Status: None   Collection Time: 12/09/22  4:48 AM  Result Value Ref Range   Magnesium 2.2 1.7 - 2.4 mg/dL    Comment: Performed at [facility], [place] [address] [place], [place] [address]  Glucose, capillary     Status: None   Collection Time: 12/09/22  8:02 AM  Result Value Ref Range   Glucose-Capillary 85 70 - 99 mg/dL    Comment: Glucose reference range applies only to samples taken after fasting for at least 8 hours.   Comment 1 Notify RN  Comment 2 Document in Chart   Glucose, capillary     Status: Abnormal   Collection Time: 12/09/22 11:10 AM   Result Value Ref Range   Glucose-Capillary 168 (H) 70 - 99 mg/dL    Comment: Glucose reference range applies only to samples taken after fasting for at least 8 hours.   Comment 1 Notify RN    Comment 2 Document in Chart   Glucose, capillary     Status: Abnormal   Collection Time: 12/09/22  4:10 PM  Result Value Ref Range   Glucose-Capillary 166 (H) 70 - 99 mg/dL    Comment: Glucose reference range applies only to samples taken after fasting for at least 8 hours.   Comment 1 Notify RN    Comment 2 Document in Chart   Glucose, capillary     Status: Abnormal   Collection Time: 12/09/22  7:53 PM  Result Value Ref Range   Glucose-Capillary 112 (H) 70 - 99 mg/dL    Comment: Glucose reference range applies only to samples taken after fasting for at least 8 hours.  Glucose, capillary     Status: Abnormal   Collection Time: 12/09/22 11:34 PM  Result Value Ref Range   Glucose-Capillary 127 (H) 70 - 99 mg/dL    Comment: Glucose reference range applies only to samples taken after fasting for at least 8 hours.  Glucose, capillary     Status: Abnormal   Collection Time: 12/10/22  4:50 AM  Result Value Ref Range   Glucose-Capillary 104 (H) 70 - 99 mg/dL    Comment: Glucose reference range applies only to samples taken after fasting for at least 8 hours.  Basic metabolic panel     Status: Abnormal   Collection Time: 12/10/22  7:23 AM  Result Value Ref Range   Sodium 135 135 - 145 mmol/L   Potassium 3.3 (L) 3.5 - 5.1 mmol/L   Chloride 108 98 - 111 mmol/L   CO2 20 (L) 22 - 32 mmol/L   Glucose, Bld 88 70 - 99 mg/dL    Comment: Glucose reference range applies only to samples taken after fasting for at least 8 hours.   BUN 37 (H) 8 - 23 mg/dL   Creatinine, Ser 1.16 (H) 0.44 - 1.00 mg/dL   Calcium 6.7 (L) 8.9 - 10.3 mg/dL   GFR, Estimated 49 (L) >60 mL/min    Comment: (NOTE) Calculated using the CKD-EPI Creatinine Equation (2021)    Anion gap 7 5 - 15    Comment: Performed at [facility], [place] [address], [place], [place] [address]  CBC     Status: Abnormal   Collection Time: 12/10/22  7:23 AM  Result Value Ref Range   WBC 11.4 (H) 4.0 - 10.5 K/uL   RBC 2.85 (L) 3.87 - 5.11 MIL/uL   Hemoglobin 7.6 (L) 12.0 - 15.0 g/dL   HCT 23.4 (L) 36.0 - 46.0 %   MCV 82.1 80.0 - 100.0 fL   MCH 26.7 26.0 - 34.0 pg   MCHC 32.5 30.0 - 36.0 g/dL   RDW 21.9 (H) 11.5 - 15.5 %   Platelets 227 150 - 400 K/uL   nRBC 0.2 0.0 - 0.2 %    Comment: Performed at [facility], [place] [address], [place],  [address]  Magnesium     Status: None   Collection Time: 12/10/22  7:23 AM  Result Value Ref Range   Magnesium 2.3 1.7 - 2.4 mg/dL  Comment: Performed at [facility] , [place] [address] [place], [place] [address]  Glucose, capillary     Status: Abnormal   Collection Time: 12/10/22  8:17 AM  Result Value Ref Range   Glucose-Capillary 129 (H) 70 - 99 mg/dL    Comment: Glucose reference range applies only to samples taken after fasting for at least 8 hours.   Comment 1 Notify RN    Comment 2 Document in Chart   Glucose, capillary     Status: None   Collection Time: 12/10/22 12:04 PM  Result Value Ref Range   Glucose-Capillary 82 70 - 99 mg/dL    Comment: Glucose reference range applies only to samples taken after fasting for at least 8 hours.   Comment 1 Notify RN    Comment 2 Document in Chart     Current Facility-Administered Medications  Medication Dose Route Frequency Provider Last Rate Last Admin   0.9 %  sodium chloride infusion   Intra-arterial PRN Sherrilyn Rist A, MD       [START ON 12/11/2022] amiodarone (PACERONE) tablet 200 mg  200 mg Oral Daily Erick Colace, NP       apixaban Arne Cleveland) tablet 5 mg  5 mg Oral BID Emiliano Dyer, RPH   5 mg at 12/10/22 0932   Chlorhexidine Gluconate Cloth 2 % PADS 6 each  6 each Topical Q0600 Kipp Brood, MD   6 each at 12/09/22 2258   clonazePAM (KLONOPIN) tablet 0.5 mg  0.5  mg Oral BID PRN Simonne Maffucci B, MD   0.5 mg at 12/10/22 [id number]   feeding supplement (ENSURE ENLIVE / ENSURE PLUS) liquid 237 mL  237 mL Oral BID BM Olalere, Adewale A, MD   237 mL at 12/10/22 1415   ferrous sulfate tablet 325 mg  325 mg Oral Q lunch Erick Colace, NP   325 mg at 12/09/22 1302   gabapentin (NEURONTIN) 250 MG/5ML solution 200 mg  200 mg Oral TID Pokhrel, Laxman, MD   200 mg at 12/10/22 1120   insulin aspart (novoLOG) injection 0-9 Units  0-9 Units Subcutaneous Q4H Olalere, Adewale A, MD   1 Units at 12/10/22 [id number]   levothyroxine (SYNTHROID) tablet 88 mcg  88 mcg Oral Q0600 Erick Colace, NP   88 mcg at 12/10/22 0653   midodrine (PROAMATINE) tablet 15 mg  15 mg Oral TID WC Erick Colace, NP   15 mg at 12/10/22 1120   naphazoline-pheniramine (NAPHCON-A) 0.025-0.3 % ophthalmic solution 1 drop  1 drop Both Eyes QID PRN Olalere, Adewale A, MD   1 drop at 12/01/22 1756   ondansetron (ZOFRAN-ODT) disintegrating tablet 4 mg  4 mg Oral Q8H PRN Pokhrel, Laxman, MD   4 mg at 12/10/22 [id number]   Oral care mouth rinse  15 mL Mouth Rinse PRN Agarwala, Einar Grad, MD       polyethylene glycol (MIRALAX / GLYCOLAX) packet 17 g  17 g Oral Daily PRN Gleason, Otilio Carpen, PA-C       potassium chloride SA (KLOR-CON M) CR tablet 40 mEq  40 mEq Oral Once Pokhrel, Corrie Mckusick, MD       Derrill Memo ON 12/11/2022] predniSONE (DELTASONE) tablet 40 mg  40 mg Oral Q breakfast Pokhrel, Laxman, MD       simethicone (MYLICON) chewable tablet 160 mg  160 mg Oral QID PRN Erick Colace, NP   160 mg at 12/04/22 2231   torsemide (DEMADEX) tablet 20 mg  20 mg Oral Daily  Pokhrel, Laxman, MD       traZODone (DESYREL) tablet 25 mg  25 mg Oral QHS Pokhrel, Laxman, MD   25 mg at 12/09/22 2148    Musculoskeletal: Strength & Muscle Tone: decreased Gait & Station: unable to stand Patient leans: Right    Psychiatric Specialty Exam:  Presentation  General Appearance:  Appropriate for Environment; Casual  Eye  Contact: Fair  Speech: Clear and Coherent; Normal Rate  Speech Volume: Normal  Handedness: Right   Mood and Affect  Mood: Anxious  Affect: Tearful   Thought Process  Thought Processes: Coherent; Linear  Descriptions of Associations:Intact  Orientation:Full (Time, Place and Person)  Thought Content:Logical  History of Schizophrenia/Schizoaffective disorder:No data recorded Duration of Psychotic Symptoms:No data recorded Hallucinations:Hallucinations: None  Ideas of Reference:None  Suicidal Thoughts:Suicidal Thoughts: No  Homicidal Thoughts:Homicidal Thoughts: No   Sensorium  Memory: Immediate Fair; Recent Fair; Remote Fair  Judgment: Fair  Insight: Fair   Community education officer  Concentration: Fair  Attention Span: Fair  Recall: Good  Fund of Knowledge: Fair  Language: Fair   Psychomotor Activity  Psychomotor Activity: Psychomotor Activity: Normal   Assets  Assets: Communication Skills; Desire for Improvement; Resilience; Social Support; Catering manager; Housing   Sleep  Sleep: Sleep: Fair   Physical Exam: Physical Exam Vitals and nursing note reviewed.  Constitutional:      Appearance: She is obese.  Neurological:     General: No focal deficit present.     Mental Status: She is alert and oriented to person, place, and time. Mental status is at baseline.  Psychiatric:        Attention and Perception: Attention and perception normal.        Mood and Affect: Mood normal. Affect is flat and tearful.        Speech: Speech normal.        Behavior: Behavior normal.        Thought Content: Thought content normal.        Cognition and Memory: Cognition normal.        Judgment: Judgment normal.    Review of Systems  Psychiatric/Behavioral:  Negative for depression, hallucinations, memory loss, substance abuse and suicidal ideas. The patient is nervous/anxious and has insomnia.   All other systems reviewed and  are negative.  Blood pressure (!) 130/95, pulse (!) 51, temperature 97.6 F (36.4 C), temperature source Axillary, resp. rate 10, height '5\' 3"'$  (1.6 m), weight 108.5 kg, SpO2 98 %. Body mass index is 42.37 kg/m.  Treatment Plan Summary: Plan    Recommend outpatient therapy at this time.  Patient currently on Klonopin BID prn.  If patient is open can consider starting Hydroxyzine '25mg'$  po TID prn for anxiety.   Will benefit from [facility] to help with psychosocial stressors, and follow up with CPS case. No identifiable safety barriers that warrant patient from returning to her home. Patient did state that I can't take care of myself so I need some real therapy here.   Labs reviewed and assessed, no acute indications at this time.    Psychiatry consult service to sign off.  Disposition: No evidence of imminent risk to self or others at present.   Patient does not meet criteria for psychiatric inpatient admission. Supportive therapy provided about ongoing stressors.  Suella Broad, FNP 12/10/2022 3:07 PM

## 2022-12-11 DIAGNOSIS — I951 Orthostatic hypotension: Secondary | ICD-10-CM | POA: Diagnosis not present

## 2022-12-11 LAB — CBC
HCT: 30.3 % — ABNORMAL LOW (ref 36.0–46.0)
Hemoglobin: 9 g/dL — ABNORMAL LOW (ref 12.0–15.0)
MCH: 26.7 pg (ref 26.0–34.0)
MCHC: 29.7 g/dL — ABNORMAL LOW (ref 30.0–36.0)
MCV: 89.9 fL (ref 80.0–100.0)
Platelets: 237 10*3/uL (ref 150–400)
RBC: 3.37 MIL/uL — ABNORMAL LOW (ref 3.87–5.11)
RDW: 23.1 % — ABNORMAL HIGH (ref 11.5–15.5)
WBC: 11.3 10*3/uL — ABNORMAL HIGH (ref 4.0–10.5)
nRBC: 0.2 % (ref 0.0–0.2)

## 2022-12-11 LAB — BASIC METABOLIC PANEL
Anion gap: 8 (ref 5–15)
BUN: 39 mg/dL — ABNORMAL HIGH (ref 8–23)
CO2: 21 mmol/L — ABNORMAL LOW (ref 22–32)
Calcium: 6.9 mg/dL — ABNORMAL LOW (ref 8.9–10.3)
Chloride: 109 mmol/L (ref 98–111)
Creatinine, Ser: 1.32 mg/dL — ABNORMAL HIGH (ref 0.44–1.00)
GFR, Estimated: 42 mL/min — ABNORMAL LOW (ref >60)
Glucose, Bld: 110 mg/dL — ABNORMAL HIGH (ref 70–99)
Potassium: 4.2 mmol/L (ref 3.5–5.1)
Sodium: 138 mmol/L (ref 135–145)

## 2022-12-11 LAB — MAGNESIUM: Magnesium: 1.9 mg/dL (ref 1.7–2.4)

## 2022-12-11 LAB — GLUCOSE, CAPILLARY
Glucose-Capillary: 109 mg/dL — ABNORMAL HIGH (ref 70–99)
Glucose-Capillary: 110 mg/dL — ABNORMAL HIGH (ref 70–99)
Glucose-Capillary: 140 mg/dL — ABNORMAL HIGH (ref 70–99)
Glucose-Capillary: 177 mg/dL — ABNORMAL HIGH (ref 70–99)
Glucose-Capillary: 202 mg/dL — ABNORMAL HIGH (ref 70–99)
Glucose-Capillary: 263 mg/dL — ABNORMAL HIGH (ref 70–99)

## 2022-12-11 NOTE — Progress Notes (Signed)
   12/11/22 1400  Spiritual Encounters  Type of Visit Initial  Care provided to: Patient  Referral source Chaplain team  Reason for visit Urgent spiritual support   Met with patient at bedside who was sleeping - with her lunch tray in place.  Staff encouraged visit to see if she would like to chat.  She was asleep the entire time of visit.

## 2022-12-11 NOTE — Progress Notes (Signed)
Physical Therapy Treatment Patient Details Name: Nancy Hinton MRN: [id number] DOB: 23-Jul-1946 Today's Date: 12/11/2022   History of Present Illness Nancy Hinton is a 77 y.o. F with PMH significant for CAD, atrial fibrillation on Eliquis, HFpEF, Type 2 DM, gastric bypass who presented to the ED [id number] EMS from SNF for nausea and vomiting for one week with inability to tolerate anything by mouth and bilateral leg swelling, tachycardic in Afib RVR with HR in 140's and SBP as low as 83/63.   CXR with small bilateral pleural effusions and possible LLL PNA.    PT Comments    Pt admitted with above diagnosis. Pt currently with functional limitations due to the deficits listed below (see PT Problem List). Pt has transitioned from ICU, pt presents in bed with increased lethargy and minimal verbal communication and difficulty maintaining EO. Pt positioned with HOB at 62 degrees and noted inability to maintain midline in supported sitting, PT able to engage pt with B LE TE and reposition to supine to facilitate increased I with rolling task using bed rail and B UE, pt required total A to roll side to side and limited due to B LE pain. Pt left in place, visitor present and all needs met. PT communicated with nurse per B UE weeping and pt pain behaviors. Pt will benefit from skilled PT to increase their independence and safety with mobility to allow discharge to the venue listed below.     Recommendations for follow up therapy are one component of a multi-disciplinary discharge planning process, led by the attending physician.  Recommendations may be updated based on patient status, additional functional criteria and insurance authorization.  Follow Up Recommendations  Skilled nursing-short term rehab (<3 hours/day) Can patient physically be transported by private vehicle: No   Assistance Recommended at Discharge Frequent or constant Supervision/Assistance  Patient can return home with the following  Two people to help with walking and/or transfers;Two people to help with bathing/dressing/bathroom;Assist for transportation   Equipment Recommendations  None recommended by PT    Recommendations for Other Services       Precautions / Restrictions Precautions Precaution Comments: legs weep, non ambulatory x 4 mos, hypotensive Restrictions Weight Bearing Restrictions: No     Mobility  Bed Mobility Overal bed mobility: Needs Assistance Bed Mobility: Rolling Rolling: Total assist         General bed mobility comments: PT focused on functional rolling tasks side to side, enagaging pt to turn head in direction of roll reach with B UE for bed rail and facillitated B LE hip and knee flexion as pt tolerated due to pain to increase I with rolling. PT unable to assist pt to side lying with roll to the R with Total due to pt reports of pain and PT able with total A assist pt to L side lying with improved engagement and mobility and decreased pain report.    Transfers                   General transfer comment: requires mechanical lift    Ambulation/Gait                   Stairs             Wheelchair Mobility    Modified Rankin (Stroke Patients Only)       Balance Overall balance assessment: Needs assistance (unable to determine,) Sitting-balance support: Bilateral upper extremity supported (pt in long sit position with HOB at 62  degress pt demonstrated strong L lateral lean and with cues and encouragement able to pull on bed rail with R UE to reposition and attend to midline, pt is unable to sustain midline with upright supported sitting)                                        Cognition Arousal/Alertness: Lethargic Behavior During Therapy: Flat affect Overall Cognitive Status: Within Functional Limits for tasks assessed                                          Exercises General Exercises - Lower Extremity Ankle  Circles/Pumps: AAROM, 20 reps, Both Hip ABduction/ADduction: AAROM, Both, 10 reps    General Comments General comments (skin integrity, edema, etc.): B UEs weeping      Pertinent Vitals/Pain Pain Assessment Pain Assessment: Faces Faces Pain Scale: Hurts even more Facial Expression: Grimacing Body Movements: Protection Muscle Tension: Tense, rigid Vocalization (extubated pts.): Sighing, moaning Pain Location: both legs Pain Descriptors / Indicators: Discomfort Pain Intervention(s): Limited activity within patient's tolerance, Monitored during session    Home Living                          Prior Function            PT Goals (current goals can now be found in the care plan section) Acute Rehab PT Goals Patient Stated Goal: to be able to walk again PT Goal Formulation: With patient Time For Goal Achievement: 12/19/22 Potential to Achieve Goals: Fair    Frequency    Min 2X/week      PT Plan      Co-evaluation              AM-PAC PT "6 Clicks" Mobility   Outcome Measure  Help needed turning from your back to your side while in a flat bed without using bedrails?: Total Help needed moving from lying on your back to sitting on the side of a flat bed without using bedrails?: Total Help needed moving to and from a bed to a chair (including a wheelchair)?: Total Help needed standing up from a chair using your arms (e.g., wheelchair or bedside chair)?: Total Help needed to walk in hospital room?: Total Help needed climbing 3-5 steps with a railing? : Total 6 Click Score: 6    End of Session   Activity Tolerance: Patient limited by lethargy;No increased pain;Patient limited by fatigue Patient left: in bed;with call bell/phone within reach;with bed alarm set;with family/visitor present Nurse Communication: Mobility status;Other (comment) (d/c planing with nurse indicating pt is declining SNF at this time) PT Visit Diagnosis: Muscle weakness (generalized)  (M62.81);Other symptoms and signs involving the nervous system (R29.898)     Time: [id number] PT Time Calculation (min) (ACUTE ONLY): 29 min  Charges:  $Therapeutic Exercise: 8-22 mins $Therapeutic Activity: 8-22 mins                    Baird Lyons, PT    Adair Patter 12/11/2022, 11:36 AM

## 2022-12-11 NOTE — Progress Notes (Signed)
PROGRESS NOTE    Nancy Hinton  [id number] DOB: Aug 31, 1946 DOA: 12/04/2022 PCP: Jamie Kato    Brief Narrative:   77  years old female with past medical history of type 2 diabetes, hypertension, gastric bypass in 2014 presented to hospital with nausea vomiting and leg swelling.  In the ED, patient was noted to have atrial fibrillation with RVR and there was some concern for sepsis with hypotension.  Critical care team was consulted and patient was admitted to the ICU secondary to hypotension.  Patient was put on stress dose steroids, and midodrine was started.  Patient was initially on amiodarone drip in the ICU which was changed to oral and patient was subsequently considered stable for transfer out of the ICU.  During hospitalization, patient continues to have lower leg swelling and pain and continues to feel weak and deconditioned.  Patient complains of severe lower extremity pain today.  See has not been doing much of the physical therapy but has not been accepted for CIR or LTAC.  Family wishing discharge home but patient feels not ready yet. TOC is involved.  December 11, 2022: Patient seen.  Patient is not a particularly good historian.  No new complaints.  Assessment and plan.  Paroxysmal atrial fibrillation/HFpEF Continue amiodarone and Eliquis.  Antihypertensives currently on hold.  Has been started on midodrine.  Rate controlled at this time.  Bilateral lower extremity excruciating pain, swelling.  States that it has been hurting a lot today.  Ultrasound of the lower extremities was negative for DVT.  Will start the patient on Neurontin 200 mg 3 times daily for now.  Appears like neuropathic pain.  Uncertain how much of this is connected to underlying stress, anxiety so have communicated with psychiatry team for further assessment.  Will start torsemide 20 milligrams daily for leg swelling as tolerated by blood pressure. December 11, 2022: Patient has bilateral  chronic lymphedema.  Seems to be proving.  Chronic hypotension/Baseline orthostatic hypotension   Has been weaned off Levophed.   On high-dose of midodrine and hydrocortisone every 6 hourly.  Hydrocortisone has been changed to prednisone orally at this time.  Will need to continue to taper as tolerated.    Healthcare associated pneumonia. Completed course of antibiotic.  on room air.  Mild leukocytosis but on steroids.   Hyponatremia Resolved. Sodium today is 138.    Hypokalemia Resolved.   Potassium today is 4.2.    Hypophosphatemia Replenished.  Latest magnesium of 2.0.  Latest phosphorus of 2.7.    Hypothyroidism Continue synthroid   Diabetes mellitus type 2. Continue sliding scale insulin.  On regular diet as per patient wishes.  Overall controlled.  Continue to monitor closely.   Anemia of chronic disease,  Severe protein calorie malnutrition s/p gastric bypass No obvious bleeding.  Latest hemoglobin of 7.6.  Continue iron supplementation.  Latest albumin was 2.1.  Severe deconditioning Physical therapy has recommended skilled nursing facility placement/long-term care but it looks like a plan is home with her family.  TOC actively looking at it.   Anxiety Continue clonazepam prn.  Psychiatry has been consulted  Generalized lower extremity edema/anasarca background of lymphedema.  Likely secondary to hypoalbuminemia.  Patient is positive balance for 1291 mL but unsure how accurate it is.  Patient received IV albumin followed by IV Lasix twice during hospitalization.  Spoke about increasing oral intake protein supplements, on  Ensure supplement.  Patient was on Lasix 40 daily at home.  Consider restarting torsemide from  today.  Depressed mood, tearfulness.  At this time and patient seems to benefit from further evaluation by psychiatry to assess her underlying mental situation.  Will consult psychiatry.  Difficult IV access.  Central line was discontinued yesterday due to  prolonged usage.    DVT prophylaxis: SCDs Start: 11/24/2022 2326 apixaban (ELIQUIS) tablet 5 mg   Code Status:     Code Status: Full Code  Disposition: Skilled nursing facility likely as per PT recommendation but TOC has communicated with POA and as I understand plan is disposition home.  Patient does not qualify for CIR or LTAC.  Communicated with TOC .   Status is: Inpatient  Remains inpatient appropriate because: Deconditioning, debility,  anasarca, waiting for safe disposition.   Family Communication:   Consultants:  PCCM Psychiatry.  Procedures:  Venous Doppler of the lower extremities. Central line placement and removal.  Antimicrobials:  None currently.  Completed Rocephin and Zithromax  Subjective: No new complaints. Poor historian.  Objective: Vitals:   12/10/22 1946 12/11/22 0415 12/11/22 0705 12/11/22 1454  BP: 136/77 110/68  138/80  Pulse: (!) 44 60  60  Resp: '20 16  20  '$ Temp: 98.5 F (36.9 C) 98 F (36.7 C)  98.3 F (36.8 C)  TempSrc:      SpO2: 99% 96%  99%  Weight:   105 kg   Height:       No intake or output data in the 24 hours ending 12/11/22 1750  Filed Weights   12/09/22 0500 12/10/22 0630 12/11/22 0705  Weight: 108.5 kg 108.5 kg 105 kg    Physical Examination: Body mass index is 41.01 kg/m.   General: Patient is morbidly obese, with chronic bilateral lower extremity lymphedema.  Patient is sleepy.   HENT:   Appears pale. Chest:  Clear breath sounds.   CVS: S1 &S2 heard.  Abdomen: Soft, non tender.  Extremities: Chronic bilateral lower extremity lymphedema.   CNS: Sleepy.  Data Reviewed:   CBC: Recent Labs  Lab 12/07/22 0505 12/08/22 0400 12/09/22 0448 12/10/22 0723 12/11/22 0612  WBC 12.9* 12.0* 11.3* 11.4* 11.3*  HGB 7.6* 7.5* 7.7* 7.6* 9.0*  HCT 22.9* 22.8* 23.4* 23.4* 30.3*  MCV 79.2* 80.3 80.7 82.1 89.9  PLT 217 205 218 227 237     Basic Metabolic Panel: Recent Labs  Lab 12/05/22 0440 12/06/22 0414  12/07/22 0505 12/08/22 0400 12/09/22 0448 12/10/22 0723 12/11/22 0612  NA 134* 135 136 134* 136 135 138  K 4.0 3.9 3.8 3.8 3.3* 3.3* 4.2  CL 105 108 107 109 108 108 109  CO2 21* 21* 21* 21* 21* 20* 21*  GLUCOSE 114* 106* 108* 96 120* 88 110*  BUN 21 24* 31* 36* 35* 37* 39*  CREATININE 0.91 0.95 1.13* 1.09* 1.28* 1.16* 1.32*  CALCIUM 6.8* 6.7* 6.8* 6.5* 6.7* 6.7* 6.9*  MG 2.1 2.0  --   --  2.2 2.3 1.9  PHOS 2.7  --   --   --   --   --   --      Liver Function Tests: Recent Labs  Lab 12/07/22 0505  AST 49*  ALT 45*  ALKPHOS 108  BILITOT 0.6  PROT 4.0*  ALBUMIN 2.1*      Radiology Studies: VAS [place] LOWER EXTREMITY VENOUS (DVT)  Result Date: 12/11/2022  Lower Venous DVT Study Patient Name:  Nancy Hinton  Date of Exam:   12/10/2022 Medical Rec #: [id number]  Accession #:    [id number] Date of Birth: 07/06/1946              Patient Gender: F Patient Age:   33 years Exam Location:  [facility] Procedure:      VAS [place] LOWER EXTREMITY VENOUS (DVT) Referring Phys: Corrie Mckusick POKHREL --------------------------------------------------------------------------------  Indications: Edema.  Risk Factors: None identified. Limitations: Body habitus, poor ultrasound/tissue interface, patient positioning, patient immobility, patient pain tolerance, bandages and open wound. Comparison Study: No prior studies. Performing Technologist: Oliver Hum RVT  Examination Guidelines: A complete evaluation includes B-mode imaging, spectral Doppler, color Doppler, and power Doppler as needed of all accessible portions of each vessel. Bilateral testing is considered an integral part of a complete examination. Limited examinations for reoccurring indications may be performed as noted. The reflux portion of the exam is performed with the patient in reverse Trendelenburg.  +---------+---------------+---------+-----------+----------+-------------------+ RIGHT     CompressibilityPhasicitySpontaneityPropertiesThrombus Aging      +---------+---------------+---------+-----------+----------+-------------------+ CFV                     Yes      Yes                  Patency shown with                                                        color doppler       +---------+---------------+---------+-----------+----------+-------------------+ SFJ                                                   Not well visualized +---------+---------------+---------+-----------+----------+-------------------+ FV Prox  Full                                                             +---------+---------------+---------+-----------+----------+-------------------+ FV Mid                  Yes      Yes                                      +---------+---------------+---------+-----------+----------+-------------------+ FV Distal               Yes      Yes                                      +---------+---------------+---------+-----------+----------+-------------------+ PFV                                                   Not well visualized +---------+---------------+---------+-----------+----------+-------------------+ POP      Full           Yes  Yes                                      +---------+---------------+---------+-----------+----------+-------------------+ PTV                                                   Not well visualized +---------+---------------+---------+-----------+----------+-------------------+ PERO                                                  Not well visualized +---------+---------------+---------+-----------+----------+-------------------+   +---------+---------------+---------+-----------+----------+-------------------+ LEFT     CompressibilityPhasicitySpontaneityPropertiesThrombus Aging      +---------+---------------+---------+-----------+----------+-------------------+ CFV       Full           Yes      Yes                                      +---------+---------------+---------+-----------+----------+-------------------+ SFJ      Full                                                             +---------+---------------+---------+-----------+----------+-------------------+ FV Prox                 Yes      Yes                                      +---------+---------------+---------+-----------+----------+-------------------+ FV Mid                  Yes      Yes                                      +---------+---------------+---------+-----------+----------+-------------------+ FV Distal               Yes      Yes                                      +---------+---------------+---------+-----------+----------+-------------------+ PFV                                                   Not well visualized +---------+---------------+---------+-----------+----------+-------------------+ POP      Full           Yes      Yes                                      +---------+---------------+---------+-----------+----------+-------------------+  PTV                                                   Not well visualized +---------+---------------+---------+-----------+----------+-------------------+ PERO                                                  Not well visualized +---------+---------------+---------+-----------+----------+-------------------+     Summary: RIGHT: - There is no evidence of deep vein thrombosis in the lower extremity. However, portions of this examination were limited- see technologist comments above.  - No cystic structure found in the popliteal fossa.  LEFT: - There is no evidence of deep vein thrombosis in the lower extremity. However, portions of this examination were limited- see technologist comments above.  - No cystic structure found in the popliteal fossa.  *See table(s) above for measurements and observations.  Electronically signed by Monica Martinez MD on 12/11/2022 at 12:36:28 PM.    Final       LOS: 65 days   Bonnell Public, MD [facility] Available via Epic secure chat 7am-7pm After these hours, please refer to coverage provider listed on [email or web address] 12/11/2022, 5:50 PM

## 2022-12-11 DEATH — deceased

## 2022-12-12 DIAGNOSIS — I951 Orthostatic hypotension: Secondary | ICD-10-CM | POA: Diagnosis not present

## 2022-12-12 LAB — GLUCOSE, CAPILLARY
Glucose-Capillary: 133 mg/dL — ABNORMAL HIGH (ref 70–99)
Glucose-Capillary: 148 mg/dL — ABNORMAL HIGH (ref 70–99)
Glucose-Capillary: 96 mg/dL (ref 70–99)
Glucose-Capillary: 194 mg/dL — ABNORMAL HIGH (ref 70–99)
Glucose-Capillary: 163 mg/dL — ABNORMAL HIGH (ref 70–99)

## 2022-12-12 MED ORDER — ALBUMIN HUMAN 25 % IV SOLN
25.0000 g | Freq: Every day | INTRAVENOUS | Status: AC
  Administered 2022-12-12 – 2022-12-15 (×4): 25 g via INTRAVENOUS
  Filled 2022-12-12 (×5): qty 100

## 2022-12-12 MED ORDER — SODIUM CHLORIDE 0.9% FLUSH: 10.0000 mL | INTRAVENOUS | Status: DC | PRN

## 2022-12-12 NOTE — Progress Notes (Signed)
PROGRESS NOTE    Nancy Hinton  [id number] DOB: 10/01/1946 DOA: 11/18/2022 PCP: Jamie Kato    Brief Narrative:   77  years old female with past medical history of type 2 diabetes, hypertension, gastric bypass in 2014 presented to hospital with nausea vomiting and leg swelling.  In the ED, patient was noted to have atrial fibrillation with RVR and there was some concern for sepsis with hypotension.  Critical care team was consulted and patient was admitted to the ICU secondary to hypotension.  Patient was put on stress dose steroids, and midodrine was started.  Patient was initially on amiodarone drip in the ICU which was changed to oral and patient was subsequently considered stable for transfer out of the ICU.  During hospitalization, patient continues to have lower leg swelling and pain and continues to feel weak and deconditioned.  Patient complains of severe lower extremity pain today.  See has not been doing much of the physical therapy but has not been accepted for CIR or LTAC.  Family wishing discharge home but patient feels not ready yet. TOC is involved.  December 11, 2022: Patient seen.  Patient is not a particularly good historian.  No new complaints. 12/12/2022: Patient continues to report with pain edema.  Will continue torsemide.  Will start patient on IV albumin 25 g daily x 4 doses.  Get an echocardiogram.  Will check ionized calcium.  Assessment and plan.  Paroxysmal atrial fibrillation/HFpEF Continue amiodarone and Eliquis.  Antihypertensives currently on hold.  Has been started on midodrine.  Rate controlled at this time. 12/12/2022: Continue torsemide.  Repeat echocardiogram.  Bilateral lower extremity excruciating pain, swelling.  States that it has been hurting a lot today.  Ultrasound of the lower extremities was negative for DVT.  Will start the patient on Neurontin 200 mg 3 times daily for now.  Appears like neuropathic pain.  Uncertain how much of  this is connected to underlying stress, anxiety so have communicated with psychiatry team for further assessment.  Will start torsemide 20 milligrams daily for leg swelling as tolerated by blood pressure. December 11, 2022: Patient has bilateral chronic lymphedema.  Seems to be proving. 12/12/2022: IV albumin.  Continue torsemide.  Can use extra.  Lasix IV if needed.  Chronic hypotension/Baseline orthostatic hypotension   Has been weaned off Levophed.   On high-dose of midodrine and hydrocortisone every 6 hourly.  Hydrocortisone has been changed to prednisone orally at this time.  Will need to continue to taper as tolerated.    Healthcare associated pneumonia. Completed course of antibiotic.  on room air.  Mild leukocytosis but on steroids.   Hyponatremia Resolved. Sodium today is 138.    Hypokalemia Resolved.   Potassium today is 4.2.    Hypophosphatemia Replenished.  Latest magnesium of 2.0.  Latest phosphorus of 2.7.    Hypothyroidism Continue synthroid   Diabetes mellitus type 2. Continue sliding scale insulin.  On regular diet as per patient wishes.  Overall controlled.  Continue to monitor closely.   Anemia of chronic disease,  Severe protein calorie malnutrition s/p gastric bypass No obvious bleeding.  Latest hemoglobin of 7.6.  Continue iron supplementation.  Latest albumin was 2.1.  Severe deconditioning Physical therapy has recommended skilled nursing facility placement/long-term care but it looks like a plan is home with her family.  TOC actively looking at it.   Anxiety Continue clonazepam prn.  Psychiatry has been consulted  Generalized lower extremity edema/anasarca background of lymphedema.  Likely  secondary to hypoalbuminemia.  Patient is positive balance for 1291 mL but unsure how accurate it is.  Patient received IV albumin followed by IV Lasix twice during hospitalization.  Spoke about increasing oral intake protein supplements, on  Ensure supplement.  Patient was on  Lasix 40 daily at home.  Consider restarting torsemide from today.  Depressed mood, tearfulness.  At this time and patient seems to benefit from further evaluation by psychiatry to assess her underlying mental situation.  Will consult psychiatry.  Difficult IV access.  Central line was discontinued yesterday due to prolonged usage.    DVT prophylaxis: SCDs Start: 11/19/2022 2326 apixaban (ELIQUIS) tablet 5 mg   Code Status:     Code Status: Full Code  Disposition: Skilled nursing facility likely as per PT recommendation but TOC has communicated with POA and as I understand plan is disposition home.  Patient does not qualify for CIR or LTAC.  Communicated with TOC .   Status is: Inpatient  Remains inpatient appropriate because: Deconditioning, debility,  anasarca, waiting for safe disposition.   Family Communication:   Consultants:  PCCM Psychiatry.  Procedures:  Venous Doppler of the lower extremities. Central line placement and removal.  Antimicrobials:  None currently.  Completed Rocephin and Zithromax  Subjective: Continues to report weeping of the extremities.  Objective: Vitals:   12/11/22 1940 12/12/22 0621 12/12/22 1000 12/12/22 1204  BP: 105/83 127/63  112/74  Pulse: 89   (!) 56  Resp: 20   20  Temp: (!) 97.5 F (36.4 C) 98 F (36.7 C)  (!) 97.5 F (36.4 C)  TempSrc: Oral Oral  Oral  SpO2: 100% 98%  96%  Weight:   103.5 kg   Height:        Intake/Output Summary (Last 24 hours) at 12/12/2022 1622 Last data filed at 12/12/2022 1613 Gross per 24 hour  Intake 220 ml  Output 990 ml  Net -770 ml    Filed Weights   12/10/22 0630 12/11/22 0705 12/12/22 1000  Weight: 108.5 kg 105 kg 103.5 kg    Physical Examination: Body mass index is 40.42 kg/m.   General: Patient is morbidly obese, with chronic bilateral lower extremity lymphedema.  Patient is sleepy.   HENT:   Appears pale. Chest:  Clear breath sounds.   CVS: S1 &S2 heard.  Abdomen: Soft, non tender.   Extremities: Chronic bilateral lower extremity lymphedema.   CNS: Sleepy.  Data Reviewed:   CBC: Recent Labs  Lab 12/07/22 0505 12/08/22 0400 12/09/22 0448 12/10/22 0723 12/11/22 0612  WBC 12.9* 12.0* 11.3* 11.4* 11.3*  HGB 7.6* 7.5* 7.7* 7.6* 9.0*  HCT 22.9* 22.8* 23.4* 23.4* 30.3*  MCV 79.2* 80.3 80.7 82.1 89.9  PLT 217 205 218 227 237     Basic Metabolic Panel: Recent Labs  Lab 12/06/22 0414 12/07/22 0505 12/08/22 0400 12/09/22 0448 12/10/22 0723 12/11/22 0612  NA 135 136 134* 136 135 138  K 3.9 3.8 3.8 3.3* 3.3* 4.2  CL 108 107 109 108 108 109  CO2 21* 21* 21* 21* 20* 21*  GLUCOSE 106* 108* 96 120* 88 110*  BUN 24* 31* 36* 35* 37* 39*  CREATININE 0.95 1.13* 1.09* 1.28* 1.16* 1.32*  CALCIUM 6.7* 6.8* 6.5* 6.7* 6.7* 6.9*  MG 2.0  --   --  2.2 2.3 1.9     Liver Function Tests: Recent Labs  Lab 12/07/22 0505  AST 49*  ALT 45*  ALKPHOS 108  BILITOT 0.6  PROT 4.0*  ALBUMIN 2.1*  Radiology Studies: No results found.    LOS: 12 days   Bonnell Public, MD [facility] Available via Epic secure chat 7am-7pm After these hours, please refer to coverage provider listed on [email or web address] 12/12/2022, 4:22 PM

## 2022-12-13 ENCOUNTER — Inpatient Hospital Stay (HOSPITAL_COMMUNITY): Payer: Medicare HMO

## 2022-12-13 DIAGNOSIS — I951 Orthostatic hypotension: Secondary | ICD-10-CM | POA: Diagnosis not present

## 2022-12-13 LAB — GLUCOSE, CAPILLARY
Glucose-Capillary: 104 mg/dL — ABNORMAL HIGH (ref 70–99)
Glucose-Capillary: 136 mg/dL — ABNORMAL HIGH (ref 70–99)
Glucose-Capillary: 169 mg/dL — ABNORMAL HIGH (ref 70–99)
Glucose-Capillary: 200 mg/dL — ABNORMAL HIGH (ref 70–99)
Glucose-Capillary: 85 mg/dL (ref 70–99)
Glucose-Capillary: 95 mg/dL (ref 70–99)

## 2022-12-13 LAB — COMPREHENSIVE METABOLIC PANEL
ALT: 22 U/L (ref 0–44)
AST: 12 U/L — ABNORMAL LOW (ref 15–41)
Albumin: 2.5 g/dL — ABNORMAL LOW (ref 3.5–5.0)
Alkaline Phosphatase: 70 U/L (ref 38–126)
Anion gap: 8 (ref 5–15)
BUN: 46 mg/dL — ABNORMAL HIGH (ref 8–23)
CO2: 23 mmol/L (ref 22–32)
Calcium: 7.1 mg/dL — ABNORMAL LOW (ref 8.9–10.3)
Chloride: 106 mmol/L (ref 98–111)
Creatinine, Ser: 1.43 mg/dL — ABNORMAL HIGH (ref 0.44–1.00)
GFR, Estimated: 38 mL/min — ABNORMAL LOW (ref >60)
Glucose, Bld: 91 mg/dL (ref 70–99)
Potassium: 4.1 mmol/L (ref 3.5–5.1)
Sodium: 137 mmol/L (ref 135–145)
Total Bilirubin: 0.5 mg/dL (ref 0.3–1.2)
Total Protein: 4.2 g/dL — ABNORMAL LOW (ref 6.5–8.1)

## 2022-12-13 LAB — CBC WITH DIFFERENTIAL/PLATELET
Abs Immature Granulocytes: 0.05 10*3/uL (ref 0.00–0.07)
Basophils Absolute: 0 10*3/uL (ref 0.0–0.1)
Basophils Relative: 0 %
Eosinophils Absolute: 0 10*3/uL (ref 0.0–0.5)
Eosinophils Relative: 0 %
HCT: 22.5 % — ABNORMAL LOW (ref 36.0–46.0)
Hemoglobin: 7.3 g/dL — ABNORMAL LOW (ref 12.0–15.0)
Immature Granulocytes: 1 %
Lymphocytes Relative: 5 %
Lymphs Abs: 0.5 10*3/uL — ABNORMAL LOW (ref 0.7–4.0)
MCH: 27 pg (ref 26.0–34.0)
MCHC: 32.4 g/dL (ref 30.0–36.0)
MCV: 83.3 fL (ref 80.0–100.0)
Monocytes Absolute: 0.3 10*3/uL (ref 0.1–1.0)
Monocytes Relative: 3 %
Neutro Abs: 8.3 10*3/uL — ABNORMAL HIGH (ref 1.7–7.7)
Neutrophils Relative %: 91 %
Platelets: 226 10*3/uL (ref 150–400)
RBC: 2.7 MIL/uL — ABNORMAL LOW (ref 3.87–5.11)
RDW: 22.2 % — ABNORMAL HIGH (ref 11.5–15.5)
WBC: 9.2 10*3/uL (ref 4.0–10.5)
nRBC: 0.2 % (ref 0.0–0.2)

## 2022-12-13 LAB — ECHOCARDIOGRAM COMPLETE
Height: 63 in
Weight: 3731.95 oz

## 2022-12-13 MED ORDER — SODIUM CHLORIDE 0.9 % IV BOLUS
500.0000 mL | Freq: Once | INTRAVENOUS | Status: AC
  Administered 2022-12-13: 500 mL via INTRAVENOUS

## 2022-12-13 NOTE — Progress Notes (Signed)
  Echocardiogram 2D Echocardiogram has been performed.  Nancy Hinton 12/13/2022, 11:50 AM

## 2022-12-13 NOTE — Consult Note (Signed)
[facility] Nurse Consult Note: Reason for Consult:weeping LEs Patient has been seen this admission for same, however I updated the orders today to include light compression. Wound type:venous stasis/lymphedema Pressure Injury POA: Yes, previously addressed Dressing procedure/placement/frequency:  12/13/22  Cover all weeping areas of the LEs with single layer of xeroform, top with ABD pads, secure with kerlix and 4" ACE wraps from toes to knees. Change dail  Updated orders from 12/01/22 Wound care to left buttock, left knee with ruptured blister (posterior), right pretibial area blisters:  Cleanse with NS, pat dry. Cover with xeroform gauze Kellie Simmering # [id number]), top with dry gauze and secure with silicone foam dressings or, for posterior knee, consider securement with ABD pad on top of dry gauze and Kerlix roll gauze/paper tape.    Manual massage and therapeutic lympedema wraps are outside of the scope of practice for the [facility] nurse. Please refer patient outpatient.  I have included updated resources.   Lymphedema  Resources (updated 08/2021) Each site requires a referral from your primary care MD [facility] [address], [place]  [phone] (Upper extremities)  [place], [place] [phone] (Lower extremities, PATIENT CAN NOT HAVE A WOUND)  [place] [place], [place] [address] [phone]  [place], [id number] [facility] [address] [place], [place] [phone]  [facility] [address], [place] [address] [phone]  [place] [facility]  (only treatment for lymphedema related to cancer diagnosis) [address], [place] [address] [phone]    [facility] [place], [place] [address] [phone] [facility] [place] [phone],  [address] [phone]    Re consult if needed, will not follow at this time. Thanks  Gisella Alwine [employer], RN,CWOCN, CNS, [place] [phone])

## 2022-12-13 NOTE — Progress Notes (Signed)
PROGRESS NOTE    Nancy Hinton  [id number] DOB: 08-Jan-1946 DOA: 12/02/2022 PCP: Jamie Kato    Brief Narrative:   77  years old female with past medical history of type 2 diabetes, hypertension, gastric bypass in 2014 presented to hospital with nausea vomiting and leg swelling.  In the ED, patient was noted to have atrial fibrillation with RVR and there was some concern for sepsis with hypotension.  Critical care team was consulted and patient was admitted to the ICU secondary to hypotension.  Patient was put on stress dose steroids, and midodrine was started.  Patient was initially on amiodarone drip in the ICU which was changed to oral and patient was subsequently considered stable for transfer out of the ICU.  During hospitalization, patient continues to have lower leg swelling and pain and continues to feel weak and deconditioned.  Patient complains of severe lower extremity pain today.  See has not been doing much of the physical therapy but has not been accepted for CIR or LTAC.  Family wishing discharge home but patient feels not ready yet. TOC is involved.  December 13, 2022: Patient seen.  Low blood pressure noted earlier today.  Patient is on midodrine 15 Mg p.o. 3 times daily.  Patient is also on IV albumin.  Blood pressure has improved over the course of the day, but remains low normal.  Will hold diuretics for now.  Have low threshold to use diuretics as needed.  Prognosis is guarded.  Wound care input is appreciated.  Follow echo report.   Assessment and plan.  Paroxysmal atrial fibrillation/HFpEF Continue amiodarone and Eliquis.  Antihypertensives currently on hold.  Has been started on midodrine.  Rate controlled at this time.  Bilateral lower extremity excruciating pain, swelling.  States that it has been hurting a lot today.  Ultrasound of the lower extremities was negative for DVT.  Will start the patient on Neurontin 200 mg 3 times daily for now.  Appears  like neuropathic pain.  Uncertain how much of this is connected to underlying stress, anxiety so have communicated with psychiatry team for further assessment.  Will start torsemide 20 milligrams daily for leg swelling as tolerated by blood pressure. December 13, 2022: See above documentation.  Chronic hypotension/Baseline orthostatic hypotension   Has been weaned off Levophed.   On high-dose of midodrine and hydrocortisone every 6 hourly.  Hydrocortisone has been changed to prednisone orally at this time.  Will need to continue to taper as tolerated.    Healthcare associated pneumonia. Completed course of antibiotic.  on room air.  Mild leukocytosis but on steroids.   Hyponatremia Resolved. Sodium today is 137.    Hypokalemia Resolved.   Potassium today is 4.1.    Hypophosphatemia Replenished.  Latest magnesium of 2.0.  Latest phosphorus of 2.7.    Hypothyroidism Continue synthroid   Diabetes mellitus type 2. Continue sliding scale insulin.  On regular diet as per patient wishes.  Overall controlled.  Continue to monitor closely.   Anemia of chronic disease,  Severe protein calorie malnutrition s/p gastric bypass No obvious bleeding.  Latest hemoglobin of 7.6.  Continue iron supplementation.  Latest albumin was 2.1.  Severe deconditioning Physical therapy has recommended skilled nursing facility placement/long-term care but it looks like a plan is home with her family.  TOC actively looking at it.   Anxiety Continue clonazepam prn.  Psychiatry has been consulted  Generalized lower extremity edema/anasarca background of lymphedema.  Likely secondary to hypoalbuminemia.  Patient is positive balance for 1291 mL but unsure how accurate it is.  Patient received IV albumin followed by IV Lasix twice during hospitalization.  Spoke about increasing oral intake protein supplements, on  Ensure supplement.  Patient was on Lasix 40 daily at home.  Consider restarting torsemide from  today.  Depressed mood, tearfulness.  At this time and patient seems to benefit from further evaluation by psychiatry to assess her underlying mental situation.  Will consult psychiatry.  Difficult IV access.  Central line was discontinued yesterday due to prolonged usage.    DVT prophylaxis: SCDs Start: 11/17/2022 2326 apixaban (ELIQUIS) tablet 5 mg   Code Status:     Code Status: Full Code  Disposition: Skilled nursing facility likely as per PT recommendation but TOC has communicated with POA and as I understand plan is disposition home.  Patient does not qualify for CIR or LTAC.  Communicated with TOC .   Status is: Inpatient  Remains inpatient appropriate because: Deconditioning, debility,  anasarca, waiting for safe disposition.   Family Communication:   Consultants:  PCCM Psychiatry.  Procedures:  Venous Doppler of the lower extremities. Central line placement and removal.  Antimicrobials:  None currently.  Completed Rocephin and Zithromax  Subjective: No new complaints.  Objective: Vitals:   12/13/22 1228 12/13/22 1334 12/13/22 1335 12/13/22 1537  BP: (!) 85/58 (!) 82/45  92/63  Pulse: 64 66  61  Resp: 20 18    Temp:  (!) 97.3 F (36.3 C)    TempSrc:  Oral    SpO2: 96% 91% 98%   Weight:      Height:        Intake/Output Summary (Last 24 hours) at 12/13/2022 1813 Last data filed at 12/13/2022 1700 Gross per 24 hour  Intake 1264.7 ml  Output 1550 ml  Net -285.3 ml    Filed Weights   12/11/22 0705 12/12/22 1000 12/13/22 0702  Weight: 105 kg 103.5 kg 105.8 kg    Physical Examination: Body mass index is 41.32 kg/m.   General: Patient is morbidly obese, with chronic bilateral lower extremity lymphedema.  Patient is sleepy.   HENT:   Appears pale. Chest:  Clear breath sounds.   CVS: S1 &S2 heard.  Abdomen: Soft, non tender.  Extremities: Chronic bilateral lower extremity lymphedema.   CNS: Sleepy.  Data Reviewed:   CBC: Recent Labs  Lab  12/08/22 0400 12/09/22 0448 12/10/22 0723 12/11/22 0612 12/13/22 0303  WBC 12.0* 11.3* 11.4* 11.3* 9.2  NEUTROABS  --   --   --   --  8.3*  HGB 7.5* 7.7* 7.6* 9.0* 7.3*  HCT 22.8* 23.4* 23.4* 30.3* 22.5*  MCV 80.3 80.7 82.1 89.9 83.3  PLT 205 218 227 237 226     Basic Metabolic Panel: Recent Labs  Lab 12/08/22 0400 12/09/22 0448 12/10/22 0723 12/11/22 0612 12/13/22 0303  NA 134* 136 135 138 137  K 3.8 3.3* 3.3* 4.2 4.1  CL 109 108 108 109 106  CO2 21* 21* 20* 21* 23  GLUCOSE 96 120* 88 110* 91  BUN 36* 35* 37* 39* 46*  CREATININE 1.09* 1.28* 1.16* 1.32* 1.43*  CALCIUM 6.5* 6.7* 6.7* 6.9* 7.1*  MG  --  2.2 2.3 1.9  --      Liver Function Tests: Recent Labs  Lab 12/07/22 0505 12/13/22 0303  AST 49* 12*  ALT 45* 22  ALKPHOS 108 70  BILITOT 0.6 0.5  PROT 4.0* 4.2*  ALBUMIN 2.1* 2.5*  Radiology Studies: No results found.    LOS: 85 days   Bonnell Public, MD [facility] Available via Epic secure chat 7am-7pm After these hours, please refer to coverage provider listed on [email or web address] 12/13/2022, 6:13 PM

## 2022-12-14 DIAGNOSIS — I951 Orthostatic hypotension: Secondary | ICD-10-CM | POA: Diagnosis not present

## 2022-12-14 LAB — CBC
HCT: 28.4 % — ABNORMAL LOW (ref 36.0–46.0)
Hemoglobin: 9.2 g/dL — ABNORMAL LOW (ref 12.0–15.0)
MCH: 26.9 pg (ref 26.0–34.0)
MCHC: 32.4 g/dL (ref 30.0–36.0)
MCV: 83 fL (ref 80.0–100.0)
Platelets: 201 10*3/uL (ref 150–400)
RBC: 3.42 MIL/uL — ABNORMAL LOW (ref 3.87–5.11)
RDW: 20.5 % — ABNORMAL HIGH (ref 11.5–15.5)
WBC: 9.4 10*3/uL (ref 4.0–10.5)
nRBC: 0.3 % — ABNORMAL HIGH (ref 0.0–0.2)

## 2022-12-14 LAB — CBC WITH DIFFERENTIAL/PLATELET
Abs Immature Granulocytes: 0.02 10*3/uL (ref 0.00–0.07)
Basophils Absolute: 0 10*3/uL (ref 0.0–0.1)
Basophils Relative: 0 %
Eosinophils Absolute: 0 10*3/uL (ref 0.0–0.5)
Eosinophils Relative: 0 %
HCT: 20.2 % — ABNORMAL LOW (ref 36.0–46.0)
Hemoglobin: 6.5 g/dL — CL (ref 12.0–15.0)
Immature Granulocytes: 0 %
Lymphocytes Relative: 7 %
Lymphs Abs: 0.5 10*3/uL — ABNORMAL LOW (ref 0.7–4.0)
MCH: 26.9 pg (ref 26.0–34.0)
MCHC: 32.2 g/dL (ref 30.0–36.0)
MCV: 83.5 fL (ref 80.0–100.0)
Monocytes Absolute: 0.5 10*3/uL (ref 0.1–1.0)
Monocytes Relative: 8 %
Neutro Abs: 5.9 10*3/uL (ref 1.7–7.7)
Neutrophils Relative %: 85 %
Platelets: 193 10*3/uL (ref 150–400)
RBC: 2.42 MIL/uL — ABNORMAL LOW (ref 3.87–5.11)
RDW: 21.9 % — ABNORMAL HIGH (ref 11.5–15.5)
WBC: 6.9 10*3/uL (ref 4.0–10.5)
nRBC: 0.3 % — ABNORMAL HIGH (ref 0.0–0.2)

## 2022-12-14 LAB — ABO/RH: ABO/RH(D): O POS

## 2022-12-14 LAB — RENAL FUNCTION PANEL
Albumin: 2.4 g/dL — ABNORMAL LOW (ref 3.5–5.0)
Anion gap: 7 (ref 5–15)
BUN: 53 mg/dL — ABNORMAL HIGH (ref 8–23)
CO2: 24 mmol/L (ref 22–32)
Calcium: 6.7 mg/dL — ABNORMAL LOW (ref 8.9–10.3)
Chloride: 107 mmol/L (ref 98–111)
Creatinine, Ser: 1.41 mg/dL — ABNORMAL HIGH (ref 0.44–1.00)
GFR, Estimated: 39 mL/min — ABNORMAL LOW (ref >60)
Glucose, Bld: 94 mg/dL (ref 70–99)
Phosphorus: 4.7 mg/dL — ABNORMAL HIGH (ref 2.5–4.6)
Potassium: 3.8 mmol/L (ref 3.5–5.1)
Sodium: 138 mmol/L (ref 135–145)

## 2022-12-14 LAB — OCCULT BLOOD X 1 CARD TO LAB, STOOL: Fecal Occult Bld: POSITIVE — AB

## 2022-12-14 LAB — PREPARE RBC (CROSSMATCH)

## 2022-12-14 LAB — GLUCOSE, CAPILLARY
Glucose-Capillary: 101 mg/dL — ABNORMAL HIGH (ref 70–99)
Glucose-Capillary: 142 mg/dL — ABNORMAL HIGH (ref 70–99)
Glucose-Capillary: 145 mg/dL — ABNORMAL HIGH (ref 70–99)
Glucose-Capillary: 153 mg/dL — ABNORMAL HIGH (ref 70–99)
Glucose-Capillary: 89 mg/dL (ref 70–99)
Glucose-Capillary: 93 mg/dL (ref 70–99)
Glucose-Capillary: 95 mg/dL (ref 70–99)
Glucose-Capillary: 98 mg/dL (ref 70–99)

## 2022-12-14 LAB — CALCIUM, IONIZED: Calcium, Ionized, Serum: 4.3 mg/dL — ABNORMAL LOW (ref 4.5–5.6)

## 2022-12-14 LAB — MAGNESIUM: Magnesium: 2 mg/dL (ref 1.7–2.4)

## 2022-12-14 MED ORDER — FLUDROCORTISONE ACETATE 0.1 MG PO TABS
0.1000 mg | ORAL_TABLET | Freq: Every day | ORAL | Status: DC
  Administered 2022-12-14 – 2022-12-15 (×2): 0.1 mg via ORAL
  Filled 2022-12-14 (×2): qty 1

## 2022-12-14 MED ORDER — SODIUM CHLORIDE 0.9% IV SOLUTION: Freq: Once | INTRAVENOUS | Status: AC

## 2022-12-14 MED ORDER — PANTOPRAZOLE SODIUM 40 MG IV SOLR
40.0000 mg | Freq: Two times a day (BID) | INTRAVENOUS | Status: DC
  Administered 2022-12-14 – 2022-12-15 (×3): 40 mg via INTRAVENOUS
  Filled 2022-12-14 (×3): qty 10

## 2022-12-14 MED ORDER — GERHARDT'S BUTT CREAM
TOPICAL_CREAM | Freq: Two times a day (BID) | CUTANEOUS | Status: DC
  Filled 2022-12-14: qty 1

## 2022-12-14 NOTE — Plan of Care (Signed)

## 2022-12-14 NOTE — Progress Notes (Signed)
Date and time results received: 12/14/22 0517 (use smartphrase ".now" to insert current time)  Test: CBC Critical Value: Hgb= 6.5  Name of Provider Notified: NP on call J. Olena Heckle  Orders Received? Or Actions Taken?:  waiting for new order.

## 2022-12-14 NOTE — Progress Notes (Signed)
Physical Therapy Treatment Patient Details Name: Nancy Hinton MRN: [id number] DOB: Apr 01, 1946 Today's Date: 12/14/2022   History of Present Illness Nancy Hinton is a 77 y.o. F with PMH significant for CAD, atrial fibrillation on Eliquis, HFpEF, Type 2 DM, gastric bypass who presented to the ED 22024by EMS from SNF for nausea and vomiting for one week with inability to tolerate anything by mouth and bilateral leg swelling, tachycardic in Afib RVR with HR in 140's and SBP as low as 83/63.   CXR with small bilateral pleural effusions and possible LLL PNA.    PT Comments    Pt admitted with above diagnosis. Pt currently with functional limitations due to the deficits listed below (see PT Problem List). Pt in bed and agreeable to therapy intervention. Pt presents with improved attention and engagement with therapy today. Pt receiving 1 PRBC due to low HgB of 6.5 as well as episode of hypotension 3/3 and orders for ECG. B UE and LE continue with edema and weeping. Pt reports B LE pain with light touch. Pt able to tolerate upright supported sitting in bed with no increased pain or episodes of orthostatic hypotension. PT communicated with nurse per BP, weeping extremities and current functional status. Pt left in bed with HOB at 65 degrees all needs met with pt set up for lunch. Pt will benefit from skilled PT to increase their independence and safety with mobility to allow discharge to the venue listed below.   BP semi reclined 34 degrees 91/59 BP upright supported sitting 92/61   Recommendations for follow up therapy are one component of a multi-disciplinary discharge planning process, led by the attending physician.  Recommendations may be updated based on patient status, additional functional criteria and insurance authorization.  Follow Up Recommendations  Skilled nursing-short term rehab (<3 hours/day) Can patient physically be transported by private vehicle: No   Assistance Recommended  at Discharge Frequent or constant Supervision/Assistance  Patient can return home with the following Two people to help with walking and/or transfers;Two people to help with bathing/dressing/bathroom;Assist for transportation   Equipment Recommendations  None recommended by PT    Recommendations for Other Services       Precautions / Restrictions Precautions Precaution Comments: legs weep, non ambulatory x 4 mos, hypotensive Restrictions Weight Bearing Restrictions: No     Mobility  Bed Mobility                    Transfers                   General transfer comment: requires mechanical lift    Ambulation/Gait                   Stairs             Wheelchair Mobility    Modified Rankin (Stroke Patients Only)       Balance Overall balance assessment: Needs assistance Sitting-balance support:  (pt supported in bed with HOB elevated to 65 degrees pt able to attend to midline and no evidence of LOB, pt able to activley particiapte with trunk flexion with use of B UEs for faciliatation of improved core stability, PT assessing BP) Sitting balance-Leahy Scale: Poor                                      Cognition Arousal/Alertness: Awake/alert Behavior During Therapy: [facility] for tasks  assessed/performed Overall Cognitive Status: Within Functional Limits for tasks assessed                                          Exercises General Exercises - Lower Extremity Ankle Circles/Pumps: 20 reps, Both, AROM Short Arc Quad: AAROM, Both, 10 reps Heel Slides: AAROM, Both, 10 reps Hip ABduction/ADduction: AAROM, Both, 10 reps    General Comments General comments (skin integrity, edema, etc.): B UE and LEs weeping      Pertinent Vitals/Pain Pain Assessment Pain Assessment: Faces Faces Pain Scale: Hurts even more Facial Expression: Tense Body Movements: Protection Vocalization (extubated pts.): Talking in normal tone  or no sound Pain Location: both legs Pain Descriptors / Indicators: Discomfort Pain Intervention(s): Limited activity within patient's tolerance, Monitored during session    Home Living                          Prior Function            PT Goals (current goals can now be found in the care plan section) Acute Rehab PT Goals Patient Stated Goal: to be able to get up on my feet and go to [facility] PT Goal Formulation: With patient Time For Goal Achievement: 12/19/22 Potential to Achieve Goals: Fair    Frequency    Min 2X/week      PT Plan Discharge plan needs to be updated    Co-evaluation              AM-PAC PT "6 Clicks" Mobility   Outcome Measure  Help needed turning from your back to your side while in a flat bed without using bedrails?: Total Help needed moving from lying on your back to sitting on the side of a flat bed without using bedrails?: Total Help needed moving to and from a bed to a chair (including a wheelchair)?: Total Help needed standing up from a chair using your arms (e.g., wheelchair or bedside chair)?: Total Help needed to walk in hospital room?: Total Help needed climbing 3-5 steps with a railing? : Total 6 Click Score: 6    End of Session   Activity Tolerance: No increased pain;Patient limited by fatigue Patient left: in bed;with call bell/phone within reach;with bed alarm set;with family/visitor present Nurse Communication: Mobility status;Other (comment) (d/c planing with nurse indicating pt is declining SNF at this time) PT Visit Diagnosis: Muscle weakness (generalized) (M62.81);Other symptoms and signs involving the nervous system (R29.898)     Time: [id number] PT Time Calculation (min) (ACUTE ONLY): 28 min  Charges:  $Therapeutic Exercise: 8-22 mins $Therapeutic Activity: 8-22 mins                    Baird Lyons, PT   Adair Patter 12/14/2022, 11:47 AM

## 2022-12-14 NOTE — Progress Notes (Addendum)
The patient and family requested transfer to [facility] in order to be closer to home and to her cardiologist Dr. Joni Reining at [facility].  Initiated transfer request on 12/14/2022.  The secretary at [facility] transfer center [phone] will call us back with an update on bed availability.  Provided the following number to be called back [phone].  Addendum: Received a call back from [facility] at [place] PM, transfer was declined due to their capacity.

## 2022-12-14 NOTE — Progress Notes (Addendum)
PROGRESS NOTE    Nancy Hinton  [id number] DOB: [birth year] DOA: 11/22/2022 PCP: Jamie Kato    Brief Narrative:   77  years old female with past medical history of type 2 diabetes, hypertension, gastric bypass in 2014 presented to hospital with nausea vomiting and leg swelling.  In the ED, patient was noted to have atrial fibrillation with RVR and there was some concern for sepsis with hypotension.  Critical care team was consulted and patient was admitted to the ICU secondary to hypotension.  Patient was put on stress dose steroids, and midodrine was started.  Patient was initially on amiodarone drip in the ICU which was changed to oral and patient was subsequently considered stable for transfer out of the ICU.  During hospitalization, patient continues to have lower leg swelling and pain and continues to feel weak and deconditioned.  Patient complains of severe lower extremity pain.  Followed by wound care specialist.  She has not been doing much of the physical therapy but has not been accepted for CIR or LTAC, does not qualify.  Family wishing discharge home but patient feels not ready yet. TOC is assisting with disposition.  Hospital course complicated by acute blood loss anemia.  Transfused 1 unit PRBCs for hemoglobin of 6.5K on 12/14/2022.  12/14/2022: The patient was seen and examined at bedside.  Complains of lower extremity edema and pain.  Bilateral lower extremity Doppler ultrasound on 12/10/2022 was negative for DVT.  Assessment and plan.  Paroxysmal atrial fibrillation/HFpEF Currently on amiodarone and Eliquis.   Antihypertensives currently on hold due to hypotension.   Continue midodrine. Currently rate controlled.  Bilateral lower extremity severe pain, swelling.   Bilateral lower extremity Doppler ultrasound negative for DVT.   Continue Neurontin 200 mg 3 times daily.   Appears like neuropathic pain.  Uncertain how much of this is connected to  underlying stress, anxiety so have communicated with psychiatry team for further assessment.   Torsemide 20 milligrams daily for leg swelling was held due to hypotension.  Acute blood loss anemia, anemia of chronic disease Drop of hemoglobin 6.5 from 7.3 Maintain hemoglobin greater than 7.0. Obtain FOBT Transfuse 1 unit PRBC Repeat CBC in the morning  Chronic hypotension/Baseline orthostatic hypotension Off vasopressor, Levophed.    On high-dose of midodrine and home Florinef (restarted 12/14/22)  Closely monitor vital signs.  Healthcare associated pneumonia. Completed course of antibiotic.  on room air.  Mild leukocytosis but on steroids.   Resolved, hyponatremia Sodium today is 137.   Continue to encourage increase in oral protein calorie intake.  Resolved, hypokalemia Potassium today is 4.1.    Hyperphosphatemia, previously hypophosphatemia. Monitor  Hypothyroidism Continue synthroid   Diabetes mellitus type 2. Continue sliding scale insulin.  On regular diet as per patient wishes.  Overall controlled.  Continue to monitor closely. Hemoglobin A1c 5.9 on 12/01/2022.   Anemia of chronic disease,  Severe protein calorie malnutrition s/p gastric bypass Iron deficiency anemia. No overt bleeding reported Follow FOBT Continue iron supplement. Latest albumin was 2.1.  Severe deconditioning Physical therapy has recommended skilled nursing facility placement/long-term care but it looks like a plan is home with her family.  TOC assisting with disposition.  Generalized lower extremity edema/anasarca background of lymphedema.  Likely secondary to hypoalbuminemia.  Patient is positive balance for 1291 mL but unsure how accurate it is.  Patient received IV albumin followed by IV Lasix twice during hospitalization.  Spoke about increasing oral intake protein supplements, on  Ensure supplement.  Patient was on Lasix 40 daily at home.   Torsemide held.  Depressed mood, tearfulness.  At  this time and patient seems to benefit from further evaluation by psychiatry to assess her underlying mental situation.  Seen by psychiatry, appreciate recommendations.  Difficult IV access.  Central line was discontinued due to prolonged usage.   Severe morbid obesity BMI 40 Recommend weight loss outpatient with regular physical activity and healthy dieting.   DVT prophylaxis: SCDs Start: 11/22/2022 2326 apixaban (ELIQUIS) tablet 5 mg   Code Status:     Code Status: Full Code  Disposition: Skilled nursing facility likely as per PT recommendation but TOC has communicated with POA and as I understand plan is disposition home.  Patient does not qualify for CIR or LTAC.  Communicated with TOC .   Status is: Inpatient  Remains inpatient appropriate because: Deconditioning, debility,  anasarca, waiting for safe disposition.   Family Communication: None at bedside.  Consultants:  PCCM Psychiatry.  Procedures:  Venous Doppler of the lower extremities. Central line placement and removal.  Antimicrobials:  None currently.  Completed Rocephin and Zithromax    Objective: Vitals:   12/14/22 0500 12/14/22 1001 12/14/22 1015 12/14/22 1244  BP:  (!) 82/62 (!) 96/49 (!) 100/54  Pulse:  (!) 54 73 (!) 59  Resp:  '19 18 16  '$ Temp: 98 F (36.7 C) 97.9 F (36.6 C) (!) 97.2 F (36.2 C) (!) 97.3 F (36.3 C)  TempSrc:  Oral Oral Oral  SpO2:  99% 98% 99%  Weight:      Height:        Intake/Output Summary (Last 24 hours) at 12/14/2022 1317 Last data filed at 12/14/2022 0500 Gross per 24 hour  Intake 820.08 ml  Output 807 ml  Net 13.08 ml   Filed Weights   12/12/22 1000 12/13/22 0702 12/14/22 0400  Weight: 103.5 kg 105.8 kg 104.3 kg    Physical Examination: Body mass index is 40.73 kg/m.   General: Patient is morbidly obese no acute distress.  She is alert and interactive.   HENT:   Appears pale. Chest: Clear to auscultation no wheezes or rales. CVS: Regular rate and rhythm no  rubs or gallops. Abdomen: Soft nontender normal bowel sounds 1. Extremities: Chronic bilateral lower extremity edema. CNS: Alert and interactive.  Data Reviewed:   CBC: Recent Labs  Lab 12/09/22 0448 12/10/22 0723 12/11/22 0612 12/13/22 0303 12/14/22 0301  WBC 11.3* 11.4* 11.3* 9.2 6.9  NEUTROABS  --   --   --  8.3* 5.9  HGB 7.7* 7.6* 9.0* 7.3* 6.5*  HCT 23.4* 23.4* 30.3* 22.5* 20.2*  MCV 80.7 82.1 89.9 83.3 83.5  PLT 218 227 237 226 0000000    Basic Metabolic Panel: Recent Labs  Lab 12/09/22 0448 12/10/22 0723 12/11/22 0612 12/13/22 0303 12/14/22 0301  NA 136 135 138 137 138  K 3.3* 3.3* 4.2 4.1 3.8  CL 108 108 109 106 107  CO2 21* 20* 21* 23 24  GLUCOSE 120* 88 110* 91 94  BUN 35* 37* 39* 46* 53*  CREATININE 1.28* 1.16* 1.32* 1.43* 1.41*  CALCIUM 6.7* 6.7* 6.9* 7.1* 6.7*  MG 2.2 2.3 1.9  --  2.0  PHOS  --   --   --   --  4.7*    Liver Function Tests: Recent Labs  Lab 12/13/22 0303 12/14/22 0301  AST 12*  --   ALT 22  --   ALKPHOS 70  --   BILITOT 0.5  --  PROT 4.2*  --   ALBUMIN 2.5* 2.4*     Radiology Studies: No results found.    LOS: 14 days   Kayleen Memos, MD [facility] Available via Epic secure chat 7am-7pm After these hours, please refer to coverage provider listed on [email or web address] 12/14/2022, 1:17 PM

## 2022-12-14 NOTE — Progress Notes (Signed)
Midline access assessed. Flushed without difficulty, GBR.  Dressing removed, connections tightened and line flushed. No leaking at insertion site noted or at connections.  Bandage noted to be mostly saturated to patient's midline. Dressing changed. Discussed findings with primary.

## 2022-12-14 NOTE — Care Management Important Message (Signed)
Important Message  Patient Details IM Letter given. Name: Nancy Hinton MRN: [id number] Date of Birth: June 30, 1946   Medicare Important Message Given:  Yes     Kerin Salen 12/14/2022, 11:47 AM

## 2022-12-14 NOTE — TOC Progression Note (Signed)
Transition of Care [facility]) - Progression Note    Patient Details  Name: Nancy Hinton MRN: [id number] Date of Birth: Mar 04, 1946  Transition of Care [facility] CM/SW [place], [place] Phone Number: 12/14/2022, 1:05 PM  Clinical Narrative:    CSW briefly spoke with pt's POA, Myles Gip who requested CSW call his mother, Jana Half at [phone].  CSW called and spoke with Jana Half, pt's cousin to discuss what the discharge plans are currently. CSW shared information from [facility] note on 2/29 that pt is not eligible for CIR, LTACH, or SNF. Plan was for pt to discharge home with family with recommendation for private duty caregivers. Jana Half shares pt cannot return home in the state she is in now. CSW reiterated that this plan would be for when pt is medically ready. Pt's cousin shares that pt needs to go to a facility prior to her being able to come home. CSW discussed the barriers to this of pt not having LTC insurance, not being rehab-able and possibility of being in co-pay days which cousin states she cannot afford.  Pt's cousin shares concern that pt is not getting any information from care providers. She feels that pt is being left here to die and wants pt to transfer to [facility]. CSW shared that due to EMTALA pt would require medical service at [facility] that is not provided at [facility]. Pt's cousin is not satisfied and states she will call [facility] to see what can be done. Pt's cousin agreeable to speak to attending MD. MD notified.    Expected Discharge Plan: [place] Barriers to Discharge: Continued Medical Work up  Expected Discharge Plan and Services In-house Referral: NA Discharge Planning Services: CM Consult   Living arrangements for the past 2 months: [place]                 DME Arranged: N/A DME Agency: NA       HH Arranged: NA [place] Agency: NA         Social Determinants of Health (SDOH) Interventions SDOH Screenings   Food Insecurity: No  Food Insecurity (12/01/2022)  Housing: Low Risk  (12/01/2022)  Transportation Needs: No Transportation Needs (12/01/2022)  Utilities: Not At Risk (12/01/2022)  Tobacco Use: Low Risk  (11/25/2022)    Readmission Risk Interventions    12/08/2022    1:26 PM  Readmission Risk Prevention Plan  Transportation Screening Complete  PCP or Specialist Appt within 5-7 Days Complete  Home Care Screening Complete  Medication Review (RN CM) Complete

## 2022-12-15 DIAGNOSIS — I951 Orthostatic hypotension: Secondary | ICD-10-CM | POA: Diagnosis not present

## 2022-12-15 LAB — TYPE AND SCREEN
ABO/RH(D): O POS
Antibody Screen: NEGATIVE
Unit division: 0

## 2022-12-15 LAB — COMPREHENSIVE METABOLIC PANEL
ALT: 20 U/L (ref 0–44)
AST: 17 U/L (ref 15–41)
Albumin: 2.4 g/dL — ABNORMAL LOW (ref 3.5–5.0)
Alkaline Phosphatase: 57 U/L (ref 38–126)
Anion gap: 8 (ref 5–15)
BUN: 47 mg/dL — ABNORMAL HIGH (ref 8–23)
CO2: 22 mmol/L (ref 22–32)
Calcium: 6.6 mg/dL — ABNORMAL LOW (ref 8.9–10.3)
Chloride: 108 mmol/L (ref 98–111)
Creatinine, Ser: 1.45 mg/dL — ABNORMAL HIGH (ref 0.44–1.00)
GFR, Estimated: 38 mL/min — ABNORMAL LOW (ref >60)
Glucose, Bld: 170 mg/dL — ABNORMAL HIGH (ref 70–99)
Potassium: 3.6 mmol/L (ref 3.5–5.1)
Sodium: 138 mmol/L (ref 135–145)
Total Bilirubin: 0.7 mg/dL (ref 0.3–1.2)
Total Protein: 4 g/dL — ABNORMAL LOW (ref 6.5–8.1)

## 2022-12-15 LAB — CBC
HCT: 27.9 % — ABNORMAL LOW (ref 36.0–46.0)
Hemoglobin: 9 g/dL — ABNORMAL LOW (ref 12.0–15.0)
MCH: 27.3 pg (ref 26.0–34.0)
MCHC: 32.3 g/dL (ref 30.0–36.0)
MCV: 84.5 fL (ref 80.0–100.0)
Platelets: 191 10*3/uL (ref 150–400)
RBC: 3.3 MIL/uL — ABNORMAL LOW (ref 3.87–5.11)
RDW: 20.7 % — ABNORMAL HIGH (ref 11.5–15.5)
WBC: 9.4 10*3/uL (ref 4.0–10.5)
nRBC: 0.3 % — ABNORMAL HIGH (ref 0.0–0.2)

## 2022-12-15 LAB — GLUCOSE, CAPILLARY
Glucose-Capillary: 112 mg/dL — ABNORMAL HIGH (ref 70–99)
Glucose-Capillary: 120 mg/dL — ABNORMAL HIGH (ref 70–99)
Glucose-Capillary: 134 mg/dL — ABNORMAL HIGH (ref 70–99)
Glucose-Capillary: 172 mg/dL — ABNORMAL HIGH (ref 70–99)
Glucose-Capillary: 26 mg/dL — CL (ref 70–99)
Glucose-Capillary: 97 mg/dL (ref 70–99)

## 2022-12-15 LAB — BPAM RBC
Blood Product Expiration Date: 202403312359
ISSUE DATE / TIME: 202403040951
Unit Type and Rh: 5100

## 2022-12-15 LAB — PHOSPHORUS: Phosphorus: 4.3 mg/dL (ref 2.5–4.6)

## 2022-12-15 LAB — MAGNESIUM: Magnesium: 2 mg/dL (ref 1.7–2.4)

## 2022-12-15 MED ORDER — ALBUMIN HUMAN 25 % IV SOLN
25.0000 g | Freq: Four times a day (QID) | INTRAVENOUS | Status: AC
  Administered 2022-12-15 (×2): 25 g via INTRAVENOUS
  Filled 2022-12-15 (×2): qty 100

## 2022-12-15 MED ORDER — DEXTROSE-NACL 5-0.9 % IV SOLN: INTRAVENOUS | Status: AC

## 2022-12-15 MED ORDER — TORSEMIDE 20 MG PO TABS
20.0000 mg | ORAL_TABLET | Freq: Every day | ORAL | Status: DC
  Administered 2022-12-15: 20 mg via ORAL
  Filled 2022-12-15: qty 1

## 2022-12-15 MED ORDER — DEXTROSE 50 % IV SOLN
25.0000 g | Freq: Once | INTRAVENOUS | Status: AC
  Administered 2022-12-15: 25 g via INTRAVENOUS
  Filled 2022-12-15: qty 50

## 2022-12-15 MED ORDER — CALCIUM GLUCONATE-NACL 1-0.675 GM/50ML-% IV SOLN
1.0000 g | INTRAVENOUS | Status: AC
  Administered 2022-12-15: 1000 mg via INTRAVENOUS
  Filled 2022-12-15: qty 50

## 2022-12-15 MED ORDER — CALCIUM CARBONATE 1250 (500 CA) MG PO TABS
1.0000 | ORAL_TABLET | Freq: Three times a day (TID) | ORAL | Status: AC
  Administered 2022-12-15 (×3): 1250 mg via ORAL
  Filled 2022-12-15 (×3): qty 1

## 2022-12-15 MED ORDER — TORSEMIDE 20 MG PO TABS
20.0000 mg | ORAL_TABLET | Freq: Every day | ORAL | Status: DC
  Filled 2022-12-15: qty 1

## 2022-12-15 NOTE — Consult Note (Signed)
Reason for Consult: Anemia and heme positive stool Referring Physician: [facility]  [place] HPI: This is a 77 year old female with a PMH of gastric bypass in 2014, HTN, reported history of a colonic resection, and diabetes admitted for afib with RVR.  She was noted to be hypotensive and with treatment her afib was controlled and her blood pressure improved.  During this admission, her baseline HGB of 7-9 g/dL worsened to 6.5 g/dL.  There was no evidence of any overt bleeding and her HGB improved to 9.2 g/dL after one unit of PRBC.  Her stool was checked and it was positive for blood.  She states that she had a gastric sleeve bariatric surgery in 2014.  In 2019 she was evaluated by Dr. Loletha Carrow for her IDA and it was felt to be secondary to her sleeve gastrectomy.  She states that she had an endoscopic procedure 2 months ago here in the [facility] system, but there was no record of an EGD or colonoscopy.  Per Dr. Corena Pilgrim note she was having routine Cologuards, which were negative.  The patient does not report having any hematochezia, melena, hematemesis, abdominal pain, or GERD symptoms.  Past Medical History:  Diagnosis Date   Arthritis    Diabetes mellitus without complication ([place])    history of DM prior to gastric bypass, she has not had to be on medicine since 170 lb weightloss.   Hypertension     Past Surgical History:  Procedure Laterality Date   COLON RESECTION     GASTRIC BYPASS     TONSILLECTOMY      Family History  Problem Relation Age of Onset   Alzheimer's disease Mother    Heart disease Father    Heart attack Maternal Grandmother    Breast cancer Maternal Grandmother     Social History:  reports that she has never smoked. She has never used smokeless tobacco. She reports that she does not drink alcohol and does not use drugs.  Allergies:  Allergies  Allergen Reactions   Codeine Nausea Only    Medications: Scheduled:  amiodarone  200 mg Oral Daily    calcium carbonate  1 tablet Oral TID WC   feeding supplement  237 mL Oral BID BM   ferrous sulfate  325 mg Oral Q lunch   gabapentin  200 mg Oral TID   Gerhardt's butt cream   Topical BID   hydrOXYzine  25 mg Oral TID   insulin aspart  0-9 Units Subcutaneous Q4H   levothyroxine  88 mcg Oral Q0600   midodrine  15 mg Oral TID WC   pantoprazole (PROTONIX) IV  40 mg Intravenous Q12H   torsemide  20 mg Oral Daily   traZODone  25 mg Oral QHS   Continuous:  sodium chloride     albumin human 25 g (12/15/22 1430)    Results for orders placed or performed during the hospital encounter of 11/21/2022 (from the past 24 hour(s))  Glucose, capillary     Status: Abnormal   Collection Time: 12/14/22  3:58 PM  Result Value Ref Range   Glucose-Capillary 142 (H) 70 - 99 mg/dL  Glucose, capillary     Status: None   Collection Time: 12/14/22  5:32 PM  Result Value Ref Range   Glucose-Capillary 98 70 - 99 mg/dL  CBC     Status: Abnormal   Collection Time: 12/14/22  6:30 PM  Result Value Ref Range   WBC 9.4 4.0 - 10.5 K/uL  RBC 3.42 (L) 3.87 - 5.11 MIL/uL   Hemoglobin 9.2 (L) 12.0 - 15.0 g/dL   HCT 28.4 (L) 36.0 - 46.0 %   MCV 83.0 80.0 - 100.0 fL   MCH 26.9 26.0 - 34.0 pg   MCHC 32.4 30.0 - 36.0 g/dL   RDW 20.5 (H) 11.5 - 15.5 %   Platelets 201 150 - 400 K/uL   nRBC 0.3 (H) 0.0 - 0.2 %  Glucose, capillary     Status: None   Collection Time: 12/14/22  8:24 PM  Result Value Ref Range   Glucose-Capillary 95 70 - 99 mg/dL  Glucose, capillary     Status: Abnormal   Collection Time: 12/14/22 11:52 PM  Result Value Ref Range   Glucose-Capillary 101 (H) 70 - 99 mg/dL  Glucose, capillary     Status: None   Collection Time: 12/15/22  4:47 AM  Result Value Ref Range   Glucose-Capillary 97 70 - 99 mg/dL  Glucose, capillary     Status: Abnormal   Collection Time: 12/15/22  7:42 AM  Result Value Ref Range   Glucose-Capillary 112 (H) 70 - 99 mg/dL  Glucose, capillary     Status: Abnormal    Collection Time: 12/15/22 11:38 AM  Result Value Ref Range   Glucose-Capillary 120 (H) 70 - 99 mg/dL  Comprehensive metabolic panel     Status: Abnormal   Collection Time: 12/15/22  1:23 PM  Result Value Ref Range   Sodium 138 135 - 145 mmol/L   Potassium 3.6 3.5 - 5.1 mmol/L   Chloride 108 98 - 111 mmol/L   CO2 22 22 - 32 mmol/L   Glucose, Bld 170 (H) 70 - 99 mg/dL   BUN 47 (H) 8 - 23 mg/dL   Creatinine, Ser 1.45 (H) 0.44 - 1.00 mg/dL   Calcium 6.6 (L) 8.9 - 10.3 mg/dL   Total Protein 4.0 (L) 6.5 - 8.1 g/dL   Albumin 2.4 (L) 3.5 - 5.0 g/dL   AST 17 15 - 41 U/L   ALT 20 0 - 44 U/L   Alkaline Phosphatase 57 38 - 126 U/L   Total Bilirubin 0.7 0.3 - 1.2 mg/dL   GFR, Estimated 38 (L) >60 mL/min   Anion gap 8 5 - 15  CBC     Status: Abnormal   Collection Time: 12/15/22  1:23 PM  Result Value Ref Range   WBC 9.4 4.0 - 10.5 K/uL   RBC 3.30 (L) 3.87 - 5.11 MIL/uL   Hemoglobin 9.0 (L) 12.0 - 15.0 g/dL   HCT 27.9 (L) 36.0 - 46.0 %   MCV 84.5 80.0 - 100.0 fL   MCH 27.3 26.0 - 34.0 pg   MCHC 32.3 30.0 - 36.0 g/dL   RDW 20.7 (H) 11.5 - 15.5 %   Platelets 191 150 - 400 K/uL   nRBC 0.3 (H) 0.0 - 0.2 %  Magnesium     Status: None   Collection Time: 12/15/22  1:23 PM  Result Value Ref Range   Magnesium 2.0 1.7 - 2.4 mg/dL  Phosphorus     Status: None   Collection Time: 12/15/22  1:23 PM  Result Value Ref Range   Phosphorus 4.3 2.5 - 4.6 mg/dL     No results found.  ROS:  As stated above in the HPI otherwise negative.  Blood pressure 114/80, pulse (!) 54, temperature (!) 97.3 F (36.3 C), temperature source Oral, resp. rate 19, height '5\' 3"'$  (1.6 m), weight 103.3 kg, SpO2 100 %.  PE: Gen: NAD, Alert and Oriented HEENT:  /AT, EOMI Neck: Supple, no LAD Lungs: CTA Bilaterally CV: RRR without M/G/R ABD: Soft, NTND, +BS Ext: No C/C/E  Assessment/Plan: 1) Anemia. 2) Heme positive stool. 3) History of gastric bypass - Sleeve versus Roux-en-Y. 4) History of colonic  resection.   The CT scan in February was reviewed with Radiology.  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  A prior bariatric nutrition note reports that she had a Sleeve.  Currently she does not report any severe GERD symptoms that can occur with a Sleeve, ie, esophagitis.  It will be prudent to pursue further endoscopic work up.  At the very least she will need an EGD.  Plan: 1) Follow HGB and transfuse as necessary. 2) [facility] (Dr. Henrene Pastor) will assume care in the AM as she is a [place] patient. 3) NPO after midnight for possible EGD.   Anitha Kreiser D 12/15/2022, 2:56 PM

## 2022-12-15 NOTE — Consult Note (Signed)
WOC consult requested for left buttock pressure injury and legs.  This was already performed on 3/3; please refer to previous [place] consult for descriptions, and topical treatment orders have been provided for bedside nurses to perform. Please re-consult if further assistance is needed.  Thank-you,  Julien Girt MSN, [place], [place], [place], [place]

## 2022-12-15 NOTE — Progress Notes (Addendum)
PROGRESS NOTE    Nancy Hinton  [id number] DOB: 1946/04/30 DOA: 11/27/2022 PCP: Jamie Kato    Brief Narrative:   77  years old female with past medical history of type 2 diabetes, hypertension, gastric bypass in 2014 presented to hospital with nausea vomiting and leg swelling.  In the ED, patient was noted to have atrial fibrillation with RVR and there was some concern for sepsis with hypotension.  Critical care team was consulted and patient was admitted to the ICU secondary to hypotension.  Patient was put on stress dose steroids, and midodrine was started.  Patient was initially on amiodarone drip in the ICU which was changed to oral and patient was subsequently considered stable for transfer out of the ICU.  Bilateral lower extremity Doppler ultrasound on 12/10/2022 was negative for DVT.  During hospitalization, patient continues to have lower leg swelling and pain and continues to feel weak and deconditioned.  Patient complains of severe lower extremity pain and weeping from her legs.  Followed by wound care specialist.  Florinef dc'd per cardiology recommendation due to contributing to anasarca.  She has not been doing much of the physical therapy, does not qualify for CIR or LTAC.  Family wishing discharge home but patient feels not ready yet. TOC is assisting with disposition.  Hospital course complicated by acute blood loss anemia.  Transfused 1 unit PRBCs for hemoglobin of 6.5K on 12/14/2022, repeat Hg 9.2 on 12/14/22 post blood transfusion.  Positive FOBT with concern for possible upper GI bleed.  Started IV Protonix 40 mg BID and consulted GI Dr. Benson Norway.  12/15/2022: Patient was seen and examined at bedside.  There were no acute events overnight.  She has no new complaint this morning.  States the pain in her legs is not as bad as it was yesterday.   Assessment and plan.  Paroxysmal atrial fibrillation on Eliquis HFpEF Currently on amiodarone and Eliquis.  Eliquis  held on 12/15/2022 due to concern for possible upper GI bleed. Antihypertensives currently on hold due to hypotension.   Continue midodrine. Currently rate controlled.  Bilateral lower extremity severe pain, edema.   Bilateral lower extremity Doppler ultrasound negative for DVT.   Continue Neurontin 200 mg 3 times daily.   Torsemide 20 milligrams daily for leg swelling was previously held due to hypotension. Restart home torsemide on 12/15/2022 along with 25 g of albumin x 2 doses.  Hypoalbuminemia, contributing to anasarca Albumin 2.4 on 12/14/2022. Albumin infusion 25 g every 6 hours x 2 doses. Torsemide 20 mg daily  Acute blood loss anemia, anemia of chronic disease Drop of hemoglobin 6.5 from 7.3 Hemoglobin 9.2 post 1 unit PRBC blood transfusion on 12/14/2022. Maintain hemoglobin greater than 7.0. Positive FOBT.  Positive FOBT, rule out upper GI bleed FOBT positive on 12/14/2022.  Home Eliquis held due to concern for possible upper GI bleed. Started IV Protonix 40 mg twice daily Closely monitor H&H.  Improving, chronic hypotension/orthostatic hypotension Previously on vasopressor, Levophed.    On high-dose of midodrine DC'd home Florinef per cardiology recommendation due to concern for worsening anasarca. Closely monitor vital signs.  Healthcare associated pneumonia. Completed course of antibiotics. O2 saturation 100% on ambient air   Resolved, hyponatremia Sodium today is 137.   Continue to encourage increase in oral protein calorie intake.  Resolved, hypokalemia Potassium today is 3.8  Hypocalcemia Corrected calcium for albumin 8.0 Started on p.o. calcium carbonate, awaiting repeated CMP results.  Hyperphosphatemia, previously hypophosphatemia. Monitor for now  Hypothyroidism  Continue synthroid   Diabetes mellitus type 2. Continue sliding scale insulin.  On regular diet as per patient wishes.  Overall controlled.  Continue to monitor closely. Hemoglobin A1c 5.9 on  12/01/2022.   Severe protein calorie malnutrition s/p gastric bypass Latest albumin was 2.1. Continue to encourage increase in oral protein calorie intake.  Severe deconditioning Physical therapy has recommended skilled nursing facility placement/long-term care but it looks like a plan is home with her family.  TOC assisting with disposition.  Generalized lower extremity edema/anasarca background of lymphedema.  Restarted torsemide 20 mg daily with improvement of blood pressure Net I&O -1.7 L  Depressed mood.  At this time and patient seems to benefit from further evaluation by psychiatry to assess her underlying mental situation.  Seen by psychiatry, appreciate recommendations.  Difficult IV access.  Central line was discontinued due to prolonged usage.   Severe morbid obesity BMI 40 Recommend weight loss outpatient with regular physical activity and healthy dieting.   DVT prophylaxis: SCDs Start: 12/05/2022 2326 Home Eliquis on hold since 12/15/2022 due to concern for upper GI bleed.  Code Status:     Code Status: Full Code  Disposition: Skilled nursing facility likely as per PT recommendation but TOC has communicated with POA and as I understand plan is disposition home.  Patient does not qualify for CIR or LTAC.   .   Status is: Inpatient  Remains inpatient appropriate because: Deconditioning, debility,  anasarca, waiting for safe disposition.   Family Communication: None at bedside.  Consultants:  PCCM Psychiatry GI 12/15/22  Procedures:  Venous Doppler of the lower extremities. Central line placement and removal.  Antimicrobials:  None currently.  Completed Rocephin and Zithromax    Objective: Vitals:   12/14/22 1423 12/15/22 0628 12/15/22 0715 12/15/22 1131  BP: (!) 103/50 (!) 110/57  114/80  Pulse: (!) 55 (!) 46  (!) 54  Resp: '17 17  19  '$ Temp: (!) 97.4 F (36.3 C) (!) 97.3 F (36.3 C)  (!) 97.3 F (36.3 C)  TempSrc: Oral Oral  Oral  SpO2: 100% 96%  100%   Weight:   103.3 kg   Height:        Intake/Output Summary (Last 24 hours) at 12/15/2022 1351 Last data filed at 12/15/2022 0649 Gross per 24 hour  Intake --  Output 4 ml  Net -4 ml   Filed Weights   12/13/22 0702 12/14/22 0400 12/15/22 0715  Weight: 105.8 kg 104.3 kg 103.3 kg    Physical Examination: Body mass index is 40.34 kg/m.   General: Morbidly obese in no acute distress.  She is alert and oriented x 3.  HENT:   Appears pale. Chest: Clear to auscultation no wheezes or rales. CVS: Regular rate and rhythm no rubs or gallops. Abdomen: Soft nontender normal bowel sounds 1. Extremities: Lymphedema, edema CNS: Alert and interactive.  Data Reviewed:   CBC: Recent Labs  Lab 12/11/22 0612 12/13/22 0303 12/14/22 0301 12/14/22 1830 12/15/22 1323  WBC 11.3* 9.2 6.9 9.4 9.4  NEUTROABS  --  8.3* 5.9  --   --   HGB 9.0* 7.3* 6.5* 9.2* 9.0*  HCT 30.3* 22.5* 20.2* 28.4* 27.9*  MCV 89.9 83.3 83.5 83.0 84.5  PLT 237 226 193 201 [id number]    Basic Metabolic Panel: Recent Labs  Lab 12/09/22 0448 12/10/22 0723 12/11/22 0612 12/13/22 0303 12/14/22 0301 12/15/22 1323  NA 136 135 138 137 138 138  K 3.3* 3.3* 4.2 4.1 3.8 3.6  CL 108 108  109 106 107 108  CO2 21* 20* 21* '23 24 22  '$ GLUCOSE 120* 88 110* 91 94 170*  BUN 35* 37* 39* 46* 53* 47*  CREATININE 1.28* 1.16* 1.32* 1.43* 1.41* 1.45*  CALCIUM 6.7* 6.7* 6.9* 7.1* 6.7* 6.6*  MG 2.2 2.3 1.9  --  2.0 2.0  PHOS  --   --   --   --  4.7* 4.3    Liver Function Tests: Recent Labs  Lab 12/13/22 0303 12/14/22 0301 12/15/22 1323  AST 12*  --  17  ALT 22  --  20  ALKPHOS 70  --  57  BILITOT 0.5  --  0.7  PROT 4.2*  --  4.0*  ALBUMIN 2.5* 2.4* 2.4*     Radiology Studies: No results found.    LOS: 15 days   Kayleen Memos, MD [facility] Available via Epic secure chat 7am-7pm After these hours, please refer to coverage provider listed on [email or web address] 12/15/2022, 1:51 PM

## 2022-12-15 NOTE — TOC Progression Note (Signed)
Transition of Care [facility]) - Progression Note    Patient Details  Name: Nancy Hinton MRN: [id number] Date of Birth: [birth year]  Transition of Care [facility]) CM/SW [place], [place] Phone Number: 12/15/2022, 1:23 PM  Clinical Narrative:    CSW reached out to LTAC to review pt again for possible placement however, was told pt does not meet qualifications for LTAC at this time.   Expected Discharge Plan: [place] Barriers to Discharge: Continued Medical Work up  Expected Discharge Plan and Services In-house Referral: NA Discharge Planning Services: CM Consult   Living arrangements for the past 2 months: [place]                 DME Arranged: N/A DME Agency: NA       HH Arranged: NA [place] Agency: NA         Social Determinants of Health (SDOH) Interventions SDOH Screenings   Food Insecurity: No Food Insecurity (12/01/2022)  Housing: Low Risk  (12/01/2022)  Transportation Needs: No Transportation Needs (12/01/2022)  Utilities: Not At Risk (12/01/2022)  Tobacco Use: Low Risk  (11/14/2022)    Readmission Risk Interventions    12/08/2022    1:26 PM  Readmission Risk Prevention Plan  Transportation Screening Complete  PCP or Specialist Appt within 5-7 Days Complete  Home Care Screening Complete  Medication Review (RN CM) Complete

## 2022-12-15 NOTE — Progress Notes (Addendum)
Updated the patient's cousin, Ms. Jana Half, at her request via phone.  Jana Half requested to be called for updates daily.  Called the patient's medical POA, Mr. [name], who is also Martha's son, no answer.  Martha's # [phone] Justin's # [phone]

## 2022-12-16 DIAGNOSIS — R110 Nausea: Secondary | ICD-10-CM

## 2022-12-16 DIAGNOSIS — J189 Pneumonia, unspecified organism: Secondary | ICD-10-CM

## 2022-12-16 DIAGNOSIS — I469 Cardiac arrest, cause unspecified: Secondary | ICD-10-CM | POA: Diagnosis not present

## 2022-12-16 DIAGNOSIS — I4891 Unspecified atrial fibrillation: Secondary | ICD-10-CM

## 2022-12-16 DIAGNOSIS — D509 Iron deficiency anemia, unspecified: Secondary | ICD-10-CM

## 2022-12-16 DIAGNOSIS — R195 Other fecal abnormalities: Secondary | ICD-10-CM

## 2022-12-16 DIAGNOSIS — R600 Localized edema: Secondary | ICD-10-CM

## 2022-12-16 DIAGNOSIS — R609 Edema, unspecified: Principal | ICD-10-CM

## 2022-12-16 LAB — GLUCOSE, CAPILLARY
Glucose-Capillary: 101 mg/dL — ABNORMAL HIGH (ref 70–99)
Glucose-Capillary: 102 mg/dL — ABNORMAL HIGH (ref 70–99)
Glucose-Capillary: 105 mg/dL — ABNORMAL HIGH (ref 70–99)
Glucose-Capillary: 106 mg/dL — ABNORMAL HIGH (ref 70–99)

## 2022-12-16 LAB — CBC
HCT: 28.7 % — ABNORMAL LOW (ref 36.0–46.0)
Hemoglobin: 9.2 g/dL — ABNORMAL LOW (ref 12.0–15.0)
MCH: 27.4 pg (ref 26.0–34.0)
MCHC: 32.1 g/dL (ref 30.0–36.0)
MCV: 85.4 fL (ref 80.0–100.0)
Platelets: 171 10*3/uL (ref 150–400)
RBC: 3.36 MIL/uL — ABNORMAL LOW (ref 3.87–5.11)
RDW: 21.2 % — ABNORMAL HIGH (ref 11.5–15.5)
WBC: 11 10*3/uL — ABNORMAL HIGH (ref 4.0–10.5)
nRBC: 0.3 % — ABNORMAL HIGH (ref 0.0–0.2)

## 2022-12-16 LAB — BASIC METABOLIC PANEL
Anion gap: 10 (ref 5–15)
BUN: 48 mg/dL — ABNORMAL HIGH (ref 8–23)
CO2: 19 mmol/L — ABNORMAL LOW (ref 22–32)
Calcium: 6.6 mg/dL — ABNORMAL LOW (ref 8.9–10.3)
Chloride: 109 mmol/L (ref 98–111)
Creatinine, Ser: 1.39 mg/dL — ABNORMAL HIGH (ref 0.44–1.00)
GFR, Estimated: 40 mL/min — ABNORMAL LOW (ref >60)
Glucose, Bld: 97 mg/dL (ref 70–99)
Potassium: 3.5 mmol/L (ref 3.5–5.1)
Sodium: 138 mmol/L (ref 135–145)

## 2022-12-16 LAB — PREALBUMIN: Prealbumin: 8 mg/dL — ABNORMAL LOW (ref 18–38)

## 2022-12-16 MED ORDER — ETOMIDATE 2 MG/ML IV SOLN
INTRAVENOUS | Status: AC
  Filled 2022-12-16: qty 20

## 2022-12-16 MED ORDER — NOREPINEPHRINE 4 MG/250ML-% IV SOLN
2.0000 ug/min | INTRAVENOUS | Status: DC
  Filled 2022-12-16: qty 250

## 2022-12-16 MED ORDER — TRAMADOL HCL 50 MG PO TABS
25.0000 mg | ORAL_TABLET | Freq: Once | ORAL | Status: AC | PRN
  Administered 2022-12-16: 25 mg via ORAL
  Filled 2022-12-16: qty 1

## 2022-12-16 MED ORDER — MEDIHONEY WOUND/BURN DRESSING EX PSTE
1.0000 | PASTE | Freq: Every day | CUTANEOUS | Status: DC
  Filled 2022-12-16: qty 44

## 2022-12-16 MED ORDER — ALBUMIN HUMAN 5 % IV SOLN
25.0000 g | Freq: Once | INTRAVENOUS | Status: DC
  Filled 2022-12-16: qty 500

## 2022-12-16 MED ORDER — SUCCINYLCHOLINE CHLORIDE 200 MG/10ML IV SOSY
PREFILLED_SYRINGE | INTRAVENOUS | Status: AC
  Filled 2022-12-16: qty 10

## 2022-12-16 MED ORDER — FENTANYL CITRATE PF 50 MCG/ML IJ SOSY
PREFILLED_SYRINGE | INTRAMUSCULAR | Status: AC
  Filled 2022-12-16: qty 2

## 2022-12-16 MED ORDER — LACTATED RINGERS IV BOLUS: 500.0000 mL | Freq: Once | INTRAVENOUS | Status: DC

## 2022-12-16 MED ORDER — VASOPRESSIN 20 UNITS/100 ML INFUSION FOR SHOCK: 0.0400 [IU]/min | INTRAVENOUS | Status: DC

## 2022-12-16 MED ORDER — ROCURONIUM BROMIDE 10 MG/ML (PF) SYRINGE
PREFILLED_SYRINGE | INTRAVENOUS | Status: AC
  Filled 2022-12-16: qty 10

## 2022-12-16 MED ORDER — TENECTEPLASE 50 MG IV KIT
PACK | INTRAVENOUS | Status: AC
  Filled 2022-12-16: qty 10

## 2022-12-16 MED ORDER — ALBUMIN HUMAN 5 % IV SOLN
12.5000 g | Freq: Once | INTRAVENOUS | Status: DC
  Filled 2022-12-16: qty 250

## 2022-12-16 MED ORDER — NOREPINEPHRINE 4 MG/250ML-% IV SOLN
0.0000 ug/min | INTRAVENOUS | Status: DC
  Filled 2022-12-16: qty 250

## 2022-12-16 MED ORDER — HYDROCORTISONE SOD SUC (PF) 100 MG IJ SOLR: 100.0000 mg | Freq: Three times a day (TID) | INTRAMUSCULAR | Status: DC

## 2022-12-16 MED ORDER — SODIUM CHLORIDE 0.9 % IV SOLN: 250.0000 mL | INTRAVENOUS | Status: DC

## 2022-12-16 MED FILL — Medication: Qty: 1 | Status: AC

## 2022-12-17 SURGERY — ESOPHAGOGASTRODUODENOSCOPY (EGD) WITH PROPOFOL
Anesthesia: Monitor Anesthesia Care

## 2023-01-11 NOTE — Inpatient Diabetes Management (Signed)
Inpatient Diabetes Program Recommendations  AACE/ADA: New Consensus Statement on Inpatient Glycemic Control (2015)  Target Ranges:  Prepandial:   less than 140 mg/dL      Peak postprandial:   less than 180 mg/dL (1-2 hours)      Critically ill patients:  140 - 180 mg/dL   Lab Results  Component Value Date   GLUCAP 106 (H) Dec 24, 2022   HGBA1C 5.9 (H) 12/01/2022    Review of Glycemic Control  Latest Reference Range & Units 12/15/22 19:53 12/15/22 23:35 Dec 24, 2022 00:08 2022/12/24 04:35 24-Dec-2022 08:04 12/24/2022 09:20  Glucose-Capillary 70 - 99 mg/dL 134 (H) 26 (LL) 101 (H) 105 (H) 102 (H) 106 (H)  (LL): Data is critically low  Current orders for Inpatient glycemic control: Novolog 0-9 units Q4H Florinef 100 mg x 1  Inpatient Diabetes Program Recommendations:   Noted severe hypoglycemia of 26 mg/dl following correction. Consider decreasing correction to Q4H CBGs.   Thanks, Bronson Curb, MSN, RNC-OB Diabetes Coordinator [phone] (8a-5p)

## 2023-01-11 NOTE — Procedures (Signed)
Intubation Procedure Note  Zanaiyah Podesta  [id number]  October 27, 1945  Date:01/13/2023  Time:10:46 AM   Provider Performing:Brooke Moshe Cipro, Dr. Verlee Monte at bedside    Procedure: Intubation (31500)  Indication(s) Respiratory Failure, hemodynamic instability   Consent Unable to obtain consent due to emergent nature of procedure.   Anesthesia Etomidate and Rocuronium   Time Out Verified patient identification, verified procedure, site/side was marked, verified correct patient position, special equipment/implants available, medications/allergies/relevant history reviewed, required imaging and test results available.   Sterile Technique Usual hand hygeine, masks, and gloves were used   Procedure Description Patient positioned in bed supine.  Sedation given as noted above.  Patient was intubated with endotracheal tube using Glidescope.  Initial look with glidescope 3 unable to view glottis given large tongue.  Switched to glidescope 4 blade with view was Grade 1 full glottis .  Number of attempts was  2 .  Colorimetric CO2 detector was consistent with tracheal placement with bilateral equal rhonchi breath sounds    Complications/Tolerance None; patient tolerated the procedure well. Chest X-ray is ordered to verify placement pending   EBL N/a   Specimen(s) None     Kennieth Rad, MSN, AG-ACNP-BC [facility] 01/13/23, 10:49 AM  See Amion for pager If no response to pager, please call PCCM consult pager After 7:00 pm call Elink

## 2023-01-11 NOTE — Plan of Care (Signed)
Notified by RN this morning for hypotension.  Examined the patient at the bedside, was lethargic but easily arousable and oriented, SBP in low 60s.  Discussed with GI, canceled endoscopy today.  Unfortunately no IV access on the patient. Called PCCM, discussed with Dr. Verlee Monte, highly appreciate for expeditiously evaluating the patient at bedside and placed IO access.  Started on IV fluids, Levophed.  However patient subsequently became unresponsive.  PCCM assuming care.   Estill Cotta M.D.  [facility] 2023/01/04, 9:47 AM

## 2023-01-11 NOTE — Consult Note (Signed)
NAME:  Nancy Hinton, MRN:  [id number], DOB:  14-May-1946, LOS: 17 ADMISSION DATE:  11/14/2022, CONSULTATION DATE:  01-10-23 REFERRING MD:  Tana Coast, CHIEF COMPLAINT:  unresponsive   History of Present Illness:  77yF with history of DM2, HTN, gastric bypass, AF who was admitted with concern for sepsis with hypotension. Her course was complicated by possible acute blood loss anemia and possible GIB. She had been planned for EGD today but became unresponsive this morning and developed profound shock. PCCM notified and not long after arrival went into PEA arrest. ROSC after 3 cycles of ACLS. Intubated. Transferred to ICU on levo 50.  Pertinent  Medical History  DM2 HTN Gastric bypass AF  Significant Hospital Events: Including procedures, antibiotic start and stop dates in addition to other pertinent events   3/6 transfer to ICU after PEA arrest  Interim History / Subjective:    Objective   Blood pressure (!) 64/51, pulse 60, temperature (!) 97.5 F (36.4 C), temperature source Oral, resp. rate 16, height '5\' 3"'$  (1.6 m), weight 104.1 kg, SpO2 100 %.    Vent Mode: PRVC FiO2 (%):  [100 %] 100 % Set Rate:  [15 bmp-30 bmp] 30 bmp Vt Set:  [410 mL] 410 mL PEEP:  [5 cmH20-8 cmH20] 8 cmH20 Plateau Pressure:  [14 cmH20] 14 cmH20   Intake/Output Summary (Last 24 hours) at 2023-01-10 1040 Last data filed at 01-10-23 0300 Gross per 24 hour  Intake 777.79 ml  Output --  Net 777.79 ml   Filed Weights   12/14/22 0400 12/15/22 0715 January 10, 2023 0500  Weight: 104.3 kg 103.3 kg 104.1 kg    Examination: General appearance: 77 y.o., female, female, chronically ill appearing Eyes: not tracking HENT: NCAT; dry MM Lungs: rhonchi bl, equal chest rise CV: tacy IRIR, no murmur  Abdomen: Soft, non-tender; non-distended, BS present  Extremities: 1-2+ BLE edema, warm Skin: Normal turgor and texture; no rash Neuro: unresponsive to noxious stim pre-intubation/peri arrest   Resolved Hospital Problem list     Assessment & Plan:   PEA arrest Shock Acute ischemic encephalopathy Acute hypoxic respiratory failure Called and discussed deterioration with [place]. Decision made for DNR. After transfer to ICU remained in profound shock. Pulsatility only faintly present on color doppler and on arterial line waveform but with non-perfusing pressure. Bedside US with remarkably distended RV and underfilled LV, plethoric IVC. TNK prepared and 50 mg given but without response. Time of death called at 10:37.     Labs   CBC: Recent Labs  Lab 12/13/22 0303 12/14/22 0301 12/14/22 1830 12/15/22 1323 01-10-23 0537  WBC 9.2 6.9 9.4 9.4 11.0*  NEUTROABS 8.3* 5.9  --   --   --   HGB 7.3* 6.5* 9.2* 9.0* 9.2*  HCT 22.5* 20.2* 28.4* 27.9* 28.7*  MCV 83.3 83.5 83.0 84.5 85.4  PLT 226 193 201 191 [id number]    Basic Metabolic Panel: Recent Labs  Lab 12/10/22 0723 12/11/22 0612 12/13/22 0303 12/14/22 0301 12/15/22 1323 10-Jan-2023 0537  NA 135 138 137 138 138 138  K 3.3* 4.2 4.1 3.8 3.6 3.5  CL 108 109 106 107 108 109  CO2 20* 21* '23 24 22 '$ 19*  GLUCOSE 88 110* 91 94 170* 97  BUN 37* 39* 46* 53* 47* 48*  CREATININE 1.16* 1.32* 1.43* 1.41* 1.45* 1.39*  CALCIUM 6.7* 6.9* 7.1* 6.7* 6.6* 6.6*  MG 2.3 1.9  --  2.0 2.0  --   PHOS  --   --   --  4.7* 4.3  --    GFR: Estimated Creatinine Clearance: 40.4 mL/min (A) (by C-G formula based on SCr of 1.39 mg/dL (H)). Recent Labs  Lab 12/14/22 0301 12/14/22 1830 12/15/22 1323 January 12, 2023 0537  WBC 6.9 9.4 9.4 11.0*    Liver Function Tests: Recent Labs  Lab 12/13/22 0303 12/14/22 0301 12/15/22 1323  AST 12*  --  17  ALT 22  --  20  ALKPHOS 70  --  57  BILITOT 0.5  --  0.7  PROT 4.2*  --  4.0*  ALBUMIN 2.5* 2.4* 2.4*   No results for input(s): "LIPASE", "AMYLASE" in the last 168 hours. No results for input(s): "AMMONIA" in the last 168 hours.  ABG No results found for: "PHART", "PCO2ART", "PO2ART", "HCO3", "TCO2", "ACIDBASEDEF", "O2SAT"    Coagulation Profile: No results for input(s): "INR", "PROTIME" in the last 168 hours.  Cardiac Enzymes: No results for input(s): "CKTOTAL", "CKMB", "CKMBINDEX", "TROPONINI" in the last 168 hours.  HbA1C: Hemoglobin A1C  Date/Time Value Ref Range Status  10/02/2013 02:03 PM 5.2  Final   Hgb A1c MFr Bld  Date/Time Value Ref Range Status  12/01/2022 07:51 AM 5.9 (H) 4.8 - 5.6 % Final    Comment:    (NOTE) Pre diabetes:          5.7%-6.4%  Diabetes:              >6.4%  Glycemic control for   <7.0% adults with diabetes     CBG: Recent Labs  Lab 12/15/22 2335 01-12-2023 0008 01/12/2023 0435 12-Jan-2023 0804 Jan 12, 2023 0920  GLUCAP 26* 101* 105* 102* 106*    Review of Systems:   Unable to obtain in setting acute ischemic encephalopathy  Past Medical History:  She,  has a past medical history of Arthritis, Diabetes mellitus without complication ([place]), and Hypertension.   Surgical History:   Past Surgical History:  Procedure Laterality Date   COLON RESECTION     GASTRIC BYPASS     TONSILLECTOMY       Social History:   reports that she has never smoked. She has never used smokeless tobacco. She reports that she does not drink alcohol and does not use drugs.   Family History:  Her family history includes Alzheimer's disease in her mother; Breast cancer in her maternal grandmother; Heart attack in her maternal grandmother; Heart disease in her father.   Allergies Allergies  Allergen Reactions   Codeine Nausea Only     Home Medications  Prior to Admission medications   Medication Sig Start Date End Date Taking? Authorizing Provider  acetaminophen (TYLENOL) 325 MG tablet Take 650 mg by mouth every 6 (six) hours as needed for moderate pain or fever.   Yes [provider]  apixaban (ELIQUIS) 5 MG TABS tablet Take 5 mg by mouth 2 (two) times daily.   Yes [provider]  benzonatate (TESSALON) 100 MG capsule Take 100 mg by mouth 3 (three) times daily as  needed for cough.   Yes [provider]  calcium carbonate (TUMS EX) 750 MG chewable tablet Chew 1 tablet by mouth 3 (three) times daily.   Yes [provider]  dextromethorphan-guaiFENesin (ROBITUSSIN-DM) 10-100 MG/5ML liquid Take 5 mLs by mouth every 6 (six) hours as needed for cough.   Yes [provider]  docusate sodium (COLACE) 100 MG capsule Take 100 mg by mouth 2 (two) times daily.   Yes [provider]  FEROSUL 325 (65 Fe) MG tablet Take 1 tablet  by mouth daily at 6 (six) AM. 06/08/22  Yes [provider]  fludrocortisone (FLORINEF) 0.1 MG tablet Take 0.1 mg by mouth daily.   Yes [provider]  furosemide (LASIX) 40 MG tablet Take 1 tablet (40 mg total) by mouth daily for 14 doses. 11/18/22 12/02/22 Yes Countryman, Cheri Rous, MD  JARDIANCE 10 MG TABS tablet Take 10 mg by mouth daily. 03/14/22  Yes [provider]  levothyroxine (SYNTHROID, LEVOTHROID) 88 MCG tablet TAKE 1 TABLET BY MOUTH EVERY DAY BEFORE BREAKFAST 03/02/17  Yes Shawnee Knapp, MD  loperamide (IMODIUM A-D) 2 MG tablet Take 2 mg by mouth 4 (four) times daily as needed for diarrhea or loose stools.   Yes [provider]  melatonin 5 MG TABS Take 5 mg by mouth at bedtime.   Yes [provider]  midodrine (PROAMATINE) 10 MG tablet Take 10 mg by mouth 3 (three) times daily.   Yes [provider]  Multiple Vitamin (MULITIVITAMIN WITH MINERALS) TABS Take 1 tablet by mouth daily. Reported on 12/20/2015   Yes [provider]  ondansetron (ZOFRAN-ODT) 4 MG disintegrating tablet Take 4 mg by mouth every 8 (eight) hours as needed for nausea or vomiting.   Yes [provider]  Pollen Extracts (PROSTAT PO) Take 30 mLs by mouth in the morning and at bedtime.   Yes [provider]  potassium chloride (KLOR-CON) 10 MEQ tablet TAKE 1 TABLET EVERY DAY 07/23/22  Yes Belva Crome, MD  Vitamin D, Ergocalciferol, (DRISDOL) 50000 units CAPS  capsule TAKE 1 CAPSULE BY MOUTH EVERY TUESDAY 04/15/17  Yes Shawnee Knapp, MD  ACCU-CHEK GUIDE test strip  05/23/21   [provider]  clonazePAM (KLONOPIN) 1 MG tablet TAKE 1 TABLET BY MOUTH EVERY NIGHT AT BEDTIME AS NEEDED FOR ANXIETY Patient not taking: Reported on 12/01/2022 10/02/16   Jaynee Eagles, PA-C  furosemide (LASIX) 20 MG tablet Take 1 tablet (20 mg total) by mouth daily. Patient not taking: Reported on 12/01/2022 07/07/21   Belva Crome, MD  lisinopril (PRINIVIL,ZESTRIL) 20 MG tablet Take 1 tablet (20 mg total) by mouth daily. Patient not taking: Reported on 12/01/2022 10/03/15   Rikki Spearing P, DO  metoprolol succinate (TOPROL-XL) 25 MG 24 hr tablet TAKE 1 TABLET EVERY DAY Patient not taking: Reported on 12/01/2022 07/23/22   Belva Crome, MD     Critical care time: 45 minutes

## 2023-01-11 NOTE — Progress Notes (Addendum)
Patient arrived to room 1240. Intubated. Rochele Pages NP placed A line  Central line    1000 Epi 1 AMP 1011  EPI 1/2 Amp 1014 BiCarb 1 Amp 1014  VASO GTT  1022 EPI 1/2 AMP  1024 EPI GTT 10 MCG  1026 EPI GTT 35mg 1028 EPI 1 Amp  1029 Bicarb 1 AMP 1031 TNK per protocol TOD 1037

## 2023-01-11 NOTE — Progress Notes (Signed)
   2022-12-30 0432  Assess: MEWS Score  Temp (!) 97.5 F (36.4 C)  BP (!) 76/45  MAP (mmHg) (!) 54  Pulse Rate 66  Resp 17  SpO2 (!) 79 %  O2 Device Nasal Cannula  Assess: MEWS Score  MEWS Temp 0  MEWS Systolic 2  MEWS Pulse 0  MEWS RR 0  MEWS LOC 0  MEWS Score 2  MEWS Score Color Yellow  Assess: if the MEWS score is Yellow or Red  Were vital signs taken at a resting state? Yes  Focused Assessment No change from prior assessment  Does the patient meet 2 or more of the SIRS criteria? No  Does the patient have a confirmed or suspected source of infection? No  Provider and Rapid Response Notified? Yes  MEWS guidelines implemented  Yes, yellow  Treat  MEWS Interventions Considered administering scheduled or prn medications/treatments as ordered  Take Vital Signs  Increase Vital Sign Frequency  Yellow: Q2hr x1, continue Q4hrs until patient remains green for 12hrs  Escalate  MEWS: Escalate Yellow: Discuss with charge nurse and consider notifying provider and/or RRT  Notify: Charge Nurse/RN  Name of Charge Nurse/RN Notified Tom,RN  Provider Notification  Provider Name/Title J. Olena Heckle  Date Provider Notified 12/30/2022  Time Provider Notified 0430  Method of Notification Page  Notification Reason Other (Comment) (HR BP)  Provider response At bedside  Date of Provider Response 12/30/22  Time of Provider Response 0435  Notify: Rapid Response  Name of Rapid Response RN Notified mandy  Date Rapid Response Notified Dec 30, 2022  Time Rapid Response Notified [id number]  Assess: SIRS CRITERIA  SIRS Temperature  0  SIRS Pulse 0  SIRS Respirations  0  SIRS WBC 0  SIRS Score Sum  0   NP to bedside new BP hR and Oz retaken in another hand. Continue to monitor.

## 2023-01-11 NOTE — Discharge Summary (Addendum)
DEATH SUMMARY   Patient Details  Name: Nancy Hinton MRN: [id number] DOB: 06-08-1946  Admission/Discharge Information   Admit Date:  21-Dec-2022  Date of Death:    Time of Death:    Length of Stay: Jan 16, 2023  Referring Physician: Ramiro Harvest, PA-C   Date of discharge, death: 01-06-23 Reason(s) for Hospitalization  Hypotension Possible GI bleeding  Diagnoses  Preliminary cause of death:  Secondary Diagnoses (including complications and co-morbidities):  Principal Problem:   Hypotension Active Problems:   Essential hypertension   H/O gastric bypass   Atrial fibrillation ([place])   Lymphedema   Type 2 diabetes mellitus without complication, without long-term current use of insulin (HCC)   Anasarca   Pressure injury of skin   Peripheral edema   Nausea   Pneumonia of both lower lobes due to infectious organism   Atrial fibrillation with RVR ([place])   Cardiac arrest, cause unspecified [facility])   [place] Hospital Course (including significant findings, care, treatment, and services provided and events leading to death)  Nancy Hinton is a 77 y.o. year old female who was admitted with concern for sepsis with hypotension. Her course was complicated by HCAP treated with ABX, hypovolemia and third spacing treated with albumin, possible acute blood loss anemia and possible GIB. She had been planned for EGD 3/6 but became unresponsive and developed profound shock. PCCM notified and not long after arrival went into PEA arrest. ROSC after 3 cycles of ACLS. Intubated. Transferred to ICU on levo 50. Called and discussed deterioration with [place]. Decision made for DNR. After transfer to ICU remained in profound shock. Pulsatility only faintly present on color doppler and on arterial line waveform but with non-perfusing pressure. Bedside US with remarkably distended RV and underfilled LV, plethoric IVC. TNK prepared and 50 mg given but without response. Time of death called at  10:37.      Pertinent Labs and Studies  Significant Diagnostic Studies VAS [place] LOWER EXTREMITY VENOUS (DVT)  Result Date: 12/11/2022  Lower Venous DVT Study Patient Name:  Nancy Hinton  Date of Exam:   12/10/2022 Medical Rec #: [id number]              Accession #:    [id number] Date of Birth: 1945/10/23              Patient Gender: F Patient Age:   89 years Exam Location:  [facility] Procedure:      VAS [place] LOWER EXTREMITY VENOUS (DVT) Referring Phys: Corrie Mckusick POKHREL --------------------------------------------------------------------------------  Indications: Edema.  Risk Factors: None identified. Limitations: Body habitus, poor ultrasound/tissue interface, patient positioning, patient immobility, patient pain tolerance, bandages and open wound. Comparison Study: No prior studies. Performing Technologist: Oliver Hum RVT  Examination Guidelines: A complete evaluation includes B-mode imaging, spectral Doppler, color Doppler, and power Doppler as needed of all accessible portions of each vessel. Bilateral testing is considered an integral part of a complete examination. Limited examinations for reoccurring indications may be performed as noted. The reflux portion of the exam is performed with the patient in reverse Trendelenburg.  +---------+---------------+---------+-----------+----------+-------------------+ RIGHT    CompressibilityPhasicitySpontaneityPropertiesThrombus Aging      +---------+---------------+---------+-----------+----------+-------------------+ CFV                     Yes      Yes                  Patency shown with  color doppler       +---------+---------------+---------+-----------+----------+-------------------+ SFJ                                                   Not well visualized +---------+---------------+---------+-----------+----------+-------------------+ FV Prox  Full                                                              +---------+---------------+---------+-----------+----------+-------------------+ FV Mid                  Yes      Yes                                      +---------+---------------+---------+-----------+----------+-------------------+ FV Distal               Yes      Yes                                      +---------+---------------+---------+-----------+----------+-------------------+ PFV                                                   Not well visualized +---------+---------------+---------+-----------+----------+-------------------+ POP      Full           Yes      Yes                                      +---------+---------------+---------+-----------+----------+-------------------+ PTV                                                   Not well visualized +---------+---------------+---------+-----------+----------+-------------------+ PERO                                                  Not well visualized +---------+---------------+---------+-----------+----------+-------------------+   +---------+---------------+---------+-----------+----------+-------------------+ LEFT     CompressibilityPhasicitySpontaneityPropertiesThrombus Aging      +---------+---------------+---------+-----------+----------+-------------------+ CFV      Full           Yes      Yes                                      +---------+---------------+---------+-----------+----------+-------------------+ SFJ      Full                                                             +---------+---------------+---------+-----------+----------+-------------------+  FV Prox                 Yes      Yes                                      +---------+---------------+---------+-----------+----------+-------------------+ FV Mid                  Yes      Yes                                       +---------+---------------+---------+-----------+----------+-------------------+ FV Distal               Yes      Yes                                      +---------+---------------+---------+-----------+----------+-------------------+ PFV                                                   Not well visualized +---------+---------------+---------+-----------+----------+-------------------+ POP      Full           Yes      Yes                                      +---------+---------------+---------+-----------+----------+-------------------+ PTV                                                   Not well visualized +---------+---------------+---------+-----------+----------+-------------------+ PERO                                                  Not well visualized +---------+---------------+---------+-----------+----------+-------------------+     Summary: RIGHT: - There is no evidence of deep vein thrombosis in the lower extremity. However, portions of this examination were limited- see technologist comments above.  - No cystic structure found in the popliteal fossa.  LEFT: - There is no evidence of deep vein thrombosis in the lower extremity. However, portions of this examination were limited- see technologist comments above.  - No cystic structure found in the popliteal fossa.  *See table(s) above for measurements and observations. Electronically signed by Monica Martinez MD on 12/11/2022 at 12:36:28 PM.    Final    CT ABDOMEN PELVIS W CONTRAST  Result Date: 11/19/2022 CLINICAL DATA:  Nausea vomiting EXAM: CT ABDOMEN AND PELVIS WITH CONTRAST TECHNIQUE: Multidetector CT imaging of the abdomen and pelvis was performed using the standard protocol following bolus administration of intravenous contrast. RADIATION DOSE REDUCTION: This exam was performed according to the departmental dose-optimization program which includes automated exposure control, adjustment of the mA and/or kV  according to patient size and/or use of iterative reconstruction technique. CONTRAST:  37m OMNIPAQUE IOHEXOL 300  MG/ML  SOLN COMPARISON:  Chest CT 10/21/2022, ultrasound 08/26/2022 FINDINGS: Lower chest: Lung bases demonstrate moderate bilateral pleural effusions increased compared to prior chest CT. Partial lower lobe consolidations. Aortic atherosclerosis. Coronary vascular calcification. Small pericardial effusion Hepatobiliary: Hepatic steatosis. Mildly prominent gallbladder without surrounding inflammation. Small gallstones. No biliary dilatation Pancreas: Atrophic.  No inflammation Spleen: Normal in size without focal abnormality. Adrenals/Urinary Tract: Adrenal glands are normal. Kidneys show no hydronephrosis. Delayed excretion of contrast consistent with decreased renal function. Urinary bladder is unremarkable Stomach/Bowel: The stomach is nonenlarged. Status post gastric bypass. No evidence for bowel obstruction. Large volume of stool in the colon suggesting constipation. No acute bowel wall thickening. Negative appendix. Vascular/Lymphatic: Moderate aortic atherosclerosis. No aneurysm. No suspicious lymph nodes. Reproductive: Uterus unremarkable. Left adnexal cyst measuring 5.4 x 4.3 by 4.5 cm. Other: Negative for pelvic effusion or free air. Extensive subcutaneous edema. Musculoskeletal: Multilevel degenerative changes. No acute osseous abnormality. IMPRESSION: 1. No CT evidence for acute intra-abdominal or pelvic abnormality. 2. Moderate bilateral pleural effusions with partial lower lobe consolidations which may be due to atelectasis or pneumonia. Extensive subcutaneous edema consistent with anasarca. 3. Decreased excretion of contrast from the kidneys consistent with decreased renal function 4. Gallstones. 5. 5.4 cm left adnexal cyst.  Recommend follow-up US in 6-12 months. Aortic Atherosclerosis (ICD10-I70.0). Electronically Signed   By: Donavan Foil M.D.   On: 11/28/2022 18:45   DG Chest Port 1  View  Result Date: 11/14/2022 CLINICAL DATA:  Nausea vomiting leg swelling EXAM: PORTABLE CHEST 1 VIEW COMPARISON:  11/18/2022, CT chest 12/01/2021, 10/21/2022 FINDINGS: Small bilateral pleural effusions. Airspace disease at the left lung base. Stable cardiomediastinal silhouette. No pneumothorax IMPRESSION: Small bilateral pleural effusions with airspace disease at the left base, probable atelectasis. No significant change since 11/18/2022. Electronically Signed   By: Donavan Foil M.D.   On: 12/04/2022 16:01   DG Chest Portable 1 View  Result Date: 11/18/2022 CLINICAL DATA:  Shortness of breath EXAM: PORTABLE CHEST 1 VIEW COMPARISON:  AP chest 10/20/2022 FINDINGS: Cardiac silhouette and mediastinal contours are within normal limits. Mild calcification within the aortic arch. New small left and trace right pleural effusions with associated mild left basilar airspace opacity. No pneumothorax. Moderate multilevel degenerative disc changes of the thoracic spine. IMPRESSION: New small left and trace right pleural effusions with associated mild left basilar airspace opacity, likely atelectasis. Electronically Signed   By: Yvonne Kendall M.D.   On: 11/18/2022 18:04    Microbiology No results found for this or any previous visit (from the past 240 hour(s)).  Lab Basic Metabolic Panel: Recent Labs  Lab 12/10/22 0723 12/11/22 0612 12/13/22 0303 12/14/22 0301 12/15/22 1323 01-11-2023 0537  NA 135 138 137 138 138 138  K 3.3* 4.2 4.1 3.8 3.6 3.5  CL 108 109 106 107 108 109  CO2 20* 21* '23 24 22 '$ 19*  GLUCOSE 88 110* 91 94 170* 97  BUN 37* 39* 46* 53* 47* 48*  CREATININE 1.16* 1.32* 1.43* 1.41* 1.45* 1.39*  CALCIUM 6.7* 6.9* 7.1* 6.7* 6.6* 6.6*  MG 2.3 1.9  --  2.0 2.0  --   PHOS  --   --   --  4.7* 4.3  --    Liver Function Tests: Recent Labs  Lab 12/13/22 0303 12/14/22 0301 12/15/22 1323  AST 12*  --  17  ALT 22  --  20  ALKPHOS 70  --  57  BILITOT 0.5  --  0.7  PROT 4.2*  --  4.0*   ALBUMIN 2.5* 2.4* 2.4*   No results for input(s): "LIPASE", "AMYLASE" in the last 168 hours. No results for input(s): "AMMONIA" in the last 168 hours. CBC: Recent Labs  Lab 12/13/22 0303 12/14/22 0301 12/14/22 1830 12/15/22 1323 2023-01-03 0537  WBC 9.2 6.9 9.4 9.4 11.0*  NEUTROABS 8.3* 5.9  --   --   --   HGB 7.3* 6.5* 9.2* 9.0* 9.2*  HCT 22.5* 20.2* 28.4* 27.9* 28.7*  MCV 83.3 83.5 83.0 84.5 85.4  PLT 226 193 201 191 171   Cardiac Enzymes: No results for input(s): "CKTOTAL", "CKMB", "CKMBINDEX", "TROPONINI" in the last 168 hours. Sepsis Labs: Recent Labs  Lab 12/14/22 0301 12/14/22 1830 12/15/22 1323 01/03/23 0537  WBC 6.9 9.4 9.4 11.0*    Procedures/Operations  See Epic for details   [name] 01/03/23, 10:58 AM

## 2023-01-11 NOTE — Progress Notes (Signed)
OT Cancellation Note  Patient Details Name: Nancy Hinton MRN: [id number] DOB: [birth year]   Cancelled Treatment:    Reason Eval/Treat Not Completed: Medical issues which prohibited therapy: Spoke with RN who requests hold of OT due to recent vitals including SpO2: 79% and BP 76/45.  Julien Girt 2022/12/20, 8:42 AM

## 2023-01-11 NOTE — Procedures (Signed)
Arterial Catheter Insertion Procedure Note  Nancy Hinton  [id number]  Jan 08, 1946  Date:2022-12-31  Time:10:49 AM    Provider Performing: Kennieth Rad    Procedure: Insertion of Arterial Line [phone]) with US guidance [id number])   Indication(s) Blood pressure monitoring and/or need for frequent ABGs  Consent Unable to obtain consent due to emergent nature of procedure.  Anesthesia None, s/p RSI   Time Out Verified patient identification, verified procedure, site/side was marked, verified correct patient position, special equipment/implants available, medications/allergies/relevant history reviewed, required imaging and test results available.   Sterile Technique Maximal sterile technique including full sterile barrier drape, hand hygiene, sterile gown, sterile gloves, mask, hair covering, sterile ultrasound probe cover (if used).   Procedure Description Area of catheter insertion was cleaned with chlorhexidine and draped in sterile fashion. With real-time ultrasound guidance an arterial catheter was placed into the right femoral artery.  Appropriate arterial tracings confirmed on monitor> poor waveform due to continued peri-arrest state/ hypotension.     Complications/Tolerance   - no procedure complications    EBL N/a   Specimen(s) None      Kennieth Rad, MSN, AG-ACNP-BC [facility] Dec 31, 2022, 10:50 AM  See Amion for pager If no response to pager, please call PCCM consult pager After 7:00 pm call Elink

## 2023-01-11 NOTE — Procedures (Signed)
Central Venous Catheter Insertion Procedure Note  Nancy Hinton  [id number]  [birth year]  Date:26-Dec-2022  Time:10:51 AM   Provider Performing:Brooke Moshe Cipro   Procedure: Insertion of Non-tunneled Central Venous Catheter([id number]) with US guidance [id number])   Indication(s) Medication administration and Difficult access  Consent Unable to obtain consent due to emergent nature of procedure.  Anesthesia S/p RSI  Timeout Verified patient identification, verified procedure, site/side was marked, verified correct patient position, special equipment/implants available, medications/allergies/relevant history reviewed, required imaging and test results available.  Sterile Technique Maximal sterile technique including full sterile barrier drape, hand hygiene, sterile gown, sterile gloves, mask, hair covering, sterile ultrasound probe cover (if used).  Procedure Description Area of catheter insertion was cleaned with chlorhexidine and draped in sterile fashion.  With real-time ultrasound guidance a central venous catheter was placed into the right femoral vein. Nonpulsatile blood flow and easy flushing noted in all ports.  The catheter was sutured in place and sterile dressing applied.  Complications/Tolerance No procedural complications.   Chest x-ray is not ordered for femoral cannulation.  EBL N/a  Specimen(s) None     Kennieth Rad, MSN, AG-ACNP-BC [facility] 12-26-22, 10:52 AM  See Amion for pager If no response to pager, please call PCCM consult pager After 7:00 pm call Elink

## 2023-01-11 NOTE — Consult Note (Addendum)
[place] Nurse Consult Note: Refer to initial Branch consult on 2/20 and another one on 3/3. Requested to assess sacrum/buttocks related to a decline in appearance.  Pt was admitted with moisture associated skin damage to bilat buttocks and a Stage 2 pressure injury was noted. This has evolved into dark red purple Deep tissue injury/unstageable yellow slough.  Affected area to sacrum is 5X6X.2 cm.  Remains surrounded by red moist macerated partial thickness skin loss to lower buttocks. A rectal pouch has been placed to attempt to contain the semi formed loose incontinent stools the patient was experiencing to promote healing.  Pt has generalized edema and weeping skin all over body.  Right posterior calf with clear fluid filled blister has ruptured and evolved into partial thickness skin loss, 3X3X.1cm, pink and moist.  Pt has Prevalon boots in place to reduce pressure.  Pressure Injury POA: Yes Dressing procedure/placement/frequency: Topical treatment orders provided for bedside nurses to perform as follows to assist with removal of nonviable skin and promote healing: Apply Medihoney to sacrum, then cover with foam dressing.  Change foam dressing Q 3 days or PRN soiling.  Foam dressings to open areas on legs, change Q 3 days or PRN soiling.  Please re-consult if further assistance is needed.  Thank-you,  Julien Girt MSN, [place], [place], [place], [place]

## 2023-01-11 NOTE — Progress Notes (Addendum)
Progress Note   Subjective  Hospital day #2 Chief Complaint: Hemoccult positive anemia  History: Did see Dr. Loletha Carrow in 2019 and her iron deficiency was believed for malabsorption after gastric bypass surgery.  She had responded appropriately to iron replacement from hematology and endoscopic procedures were deferred.  Hemoglobin remained stable 6.5--> 1 units PRBCs--> 9.2 this morning.  (Possibly erroneous first check given large increase overnight after only 1 unit PRBCs)  Today, per nursing staff still no signs of acute GI bleed.  Per hospitalist to just came out of the room she is hypotensive and being moved to the ICU.  They are optimistic that she still may be able to have an endoscopy tomorrow.  Patient does not really answer many of my questions and lays with her eyes closed.  She denies any abdominal pain.   Objective   Vital signs in last 24 hours: Temp:  [97.3 F (36.3 C)-97.5 F (36.4 C)] 97.5 F (36.4 C) 2023-01-09 0900) Pulse Rate:  [54-66] 60 01-09-23 0500) Resp:  [11-19] 16 09-Jan-2023 0900) BP: (54-114)/(43-80) 64/51 01-09-23 0900) SpO2:  [79 %-100 %] 100 % 01-09-2023 0844) FiO2 (%):  [100 %] 100 % 2023/01/09 0955) Weight:  [104.1 kg] 104.1 kg 01-09-23 0500) Last BM Date : 09-Jan-2023 General:    Morbidly obese, acutely ill, pale white female in NAD Heart:  Regular rate and rhythm; no murmurs Lungs: Respirations even and unlabored, lungs CTA bilaterally Abdomen:  Soft, nontender and nondistended. Normal bowel sounds. Psych:  Cooperative. Normal mood and affect.  Intake/Output from previous day: 03/05 0701 - 01/09/23 0700 In: 1137.8 [P.O.:840; I.V.:47.8; IV Piggyback:250] Out: -   Lab Results: Recent Labs    12/14/22 1830 12/15/22 1323 January 09, 2023 0537  WBC 9.4 9.4 11.0*  HGB 9.2* 9.0* 9.2*  HCT 28.4* 27.9* 28.7*  PLT 201 191 171   BMET Recent Labs    12/14/22 0301 12/15/22 1323 01-09-23 0537  NA 138 138 138  K 3.8 3.6 3.5  CL 107 108 109  CO2 24 22 19*  GLUCOSE 94 170*  97  BUN 53* 47* 48*  CREATININE 1.41* 1.45* 1.39*  CALCIUM 6.7* 6.6* 6.6*   LFT Recent Labs    12/15/22 1323  PROT 4.0*  ALBUMIN 2.4*  AST 17  ALT 20  ALKPHOS 57  BILITOT 0.7    Assessment / Plan:   Assessment: 1.  Iron deficiency anemia: With heme positive stool and history of Roux-en-Y gastric bypass, previously saw Dr. Loletha Carrow in 2019 and deferred endoscopic workup given history of Roux-en-Y and appropriate result after iron infusions, hemoglobin 6.5--> 1 unit PRBCs--> 9.2, no acute signs of GI bleed here, iron studies with an iron low at 25 and ferritin low at 18 on 12/07/2022; consider due to history of gastric bypass +/- anastomotic ulcer versus other upper GI bleed 2.  History of colonic resection 3.  A-fib on Eliquis: Eliquis held on 12/15/2022 due to concern for possible upper GI bleed 4.  Bilateral lower extremity severe pain and edema: Ultrasound negative for DVT, currently on Torsemide and Neurontin 5.  Positive FOBT 6.  Chronic hypotension/orthostatic hypotension: On high-dose midodrine 7.  Morbid obesity: BMI 40  Plan: 1.  No plan for emergent EGD given that she is not acutely bleeding and last dose of Eliquis 12/15/22.  Will tentatively place patient on the schedule for EGD tomorrow with Dr. Henrene Pastor.  This will be pending her cardiac status at the time and if she is still hypotensive.  If she is we will need to delay.  Did discuss risks, benefits, limitations and alternatives and the patient agrees to proceed. 2.  Patient can be on regular diet today and n.p.o. at midnight just in case. 3.  Continue Pantoprazole 40 twice daily  Thank you for your kind consultation, we will continue to follow.   LOS: 16 days   Levin Erp  01/14/23, 10:06 AM  GI ATTENDING  Patient seen yesterday by Dr. Benson Norway.  We are picking her up today.  History and data as outlined above.  Earlier patient was having issues with hypotension.   Did not appear to be related to GI bleeding.  Unfortunately, subsequently expired.   Docia Chuck. Geri [place]in[place]., M.D. [facility] Division of Gastroenterology

## 2023-01-11 NOTE — Progress Notes (Signed)
  Interdisciplinary Goals of Care Family Meeting   Date carried out:: 12-28-2022  Location of the meeting: Phone conference  Member's involved: Physician and Family Member or next of kin  Durable Power of Attorney or acting medical decision maker: [employer], HCPOA    Discussion: We discussed Nancy Hinton's deterioration and cardiac arrest. Continues to require high dose vasopressors, feel CPR/shocks would be futile if develops another cardiac arrest. He is in agreement that she be made DNR.   Code status: Full DNR  Disposition: Continue current acute care   Time spent for the meeting: 10 minutes  [name] 2022/12/28, 10:13 AM

## 2023-01-11 DEATH — deceased
